# Patient Record
Sex: Female | Born: 1935 | Race: White | Hispanic: No | Smoking: Never smoker
Health system: Southern US, Community
[De-identification: ages and names within clinical notes are randomized; demographics above are authoritative.]

## PROBLEM LIST (undated history)

## (undated) DIAGNOSIS — Z7901 Long term (current) use of anticoagulants: Secondary | ICD-10-CM

## (undated) DIAGNOSIS — I10 Essential (primary) hypertension: Secondary | ICD-10-CM

## (undated) DIAGNOSIS — M1990 Unspecified osteoarthritis, unspecified site: Secondary | ICD-10-CM

## (undated) DIAGNOSIS — R0602 Shortness of breath: Secondary | ICD-10-CM

## (undated) DIAGNOSIS — E785 Hyperlipidemia, unspecified: Secondary | ICD-10-CM

## (undated) DIAGNOSIS — I252 Old myocardial infarction: Secondary | ICD-10-CM

## (undated) DIAGNOSIS — I2510 Atherosclerotic heart disease of native coronary artery without angina pectoris: Secondary | ICD-10-CM

## (undated) DIAGNOSIS — I4891 Unspecified atrial fibrillation: Secondary | ICD-10-CM

## (undated) HISTORY — DX: Atherosclerotic heart disease of native coronary artery without angina pectoris: I25.10

## (undated) HISTORY — PX: CATARACT EXTRACTION: SUR2

## (undated) HISTORY — PX: CORONARY ANGIOPLASTY WITH STENT PLACEMENT: SHX49

## (undated) HISTORY — DX: Long term (current) use of anticoagulants: Z79.01

## (undated) HISTORY — PX: APPENDECTOMY: SHX54

## (undated) HISTORY — DX: Unspecified atrial fibrillation: I48.91

## (undated) HISTORY — PX: TOTAL HIP ARTHROPLASTY: SHX124

## (undated) HISTORY — DX: Hyperlipidemia, unspecified: E78.5

## (undated) HISTORY — DX: Essential (primary) hypertension: I10

---

## 1974-04-08 HISTORY — PX: ABDOMINAL HYSTERECTOMY: SHX81

## 2004-08-14 ENCOUNTER — Inpatient Hospital Stay (HOSPITAL_COMMUNITY): Admission: RE | Admit: 2004-08-14 | Discharge: 2004-08-20 | Payer: Self-pay | Admitting: Orthopaedic Surgery

## 2004-08-14 ENCOUNTER — Ambulatory Visit: Payer: Self-pay | Admitting: Physical Medicine & Rehabilitation

## 2004-08-20 ENCOUNTER — Inpatient Hospital Stay
Admission: RE | Admit: 2004-08-20 | Discharge: 2004-08-25 | Payer: Self-pay | Admitting: Physical Medicine & Rehabilitation

## 2006-04-08 DIAGNOSIS — I252 Old myocardial infarction: Secondary | ICD-10-CM

## 2006-04-08 HISTORY — DX: Old myocardial infarction: I25.2

## 2006-05-15 ENCOUNTER — Inpatient Hospital Stay (HOSPITAL_COMMUNITY): Admission: AD | Admit: 2006-05-15 | Discharge: 2006-05-20 | Payer: Self-pay | Admitting: Cardiovascular Disease

## 2006-05-15 ENCOUNTER — Encounter: Payer: Self-pay | Admitting: Cardiovascular Disease

## 2006-05-18 ENCOUNTER — Ambulatory Visit: Payer: Self-pay | Admitting: Vascular Surgery

## 2007-01-27 ENCOUNTER — Encounter: Payer: Self-pay | Admitting: Cardiovascular Disease

## 2010-01-16 ENCOUNTER — Ambulatory Visit: Payer: Self-pay | Admitting: Cardiovascular Disease

## 2010-08-10 ENCOUNTER — Encounter: Payer: Self-pay | Admitting: Cardiovascular Disease

## 2010-08-10 DIAGNOSIS — I2510 Atherosclerotic heart disease of native coronary artery without angina pectoris: Secondary | ICD-10-CM | POA: Insufficient documentation

## 2010-08-10 DIAGNOSIS — M109 Gout, unspecified: Secondary | ICD-10-CM | POA: Insufficient documentation

## 2010-08-10 DIAGNOSIS — Z7901 Long term (current) use of anticoagulants: Secondary | ICD-10-CM | POA: Insufficient documentation

## 2010-08-10 DIAGNOSIS — I4891 Unspecified atrial fibrillation: Secondary | ICD-10-CM | POA: Insufficient documentation

## 2010-08-16 ENCOUNTER — Ambulatory Visit: Payer: Self-pay | Admitting: Cardiovascular Disease

## 2010-08-22 ENCOUNTER — Ambulatory Visit (INDEPENDENT_AMBULATORY_CARE_PROVIDER_SITE_OTHER): Payer: Medicare Other | Admitting: Cardiovascular Disease

## 2010-08-22 ENCOUNTER — Encounter: Payer: Self-pay | Admitting: Cardiovascular Disease

## 2010-08-22 DIAGNOSIS — I4891 Unspecified atrial fibrillation: Secondary | ICD-10-CM

## 2010-08-22 DIAGNOSIS — I2510 Atherosclerotic heart disease of native coronary artery without angina pectoris: Secondary | ICD-10-CM

## 2010-08-22 NOTE — Assessment & Plan Note (Signed)
Stable. Continue Coumadin.  Managed by her medical doctor

## 2010-08-22 NOTE — Assessment & Plan Note (Signed)
Pt has done well with her CAD.  No angina.  She complains about the cost of Plavix.  She has been on it for 4 years after stenting.  OK to stop plavix at this point.  Continue asa and coumadin.

## 2010-08-22 NOTE — Progress Notes (Signed)
Kathryn Schroeder Date of Birth  12-Jun-1930 [facility] [address]     [address] [place], [place]  [address] [phone]  Fax  [phone]  History of Present Illness:  Pt has a history of A-Fib and CAD - s/p stent.  She has done well.  Working out in her garden without any problems.  Current Outpatient Prescriptions on File Prior to Visit  Medication Sig Dispense Refill  . aspirin 81 MG tablet Take 81 mg by mouth daily.        . carvedilol (COREG) 12.5 MG tablet Take 12.5 mg by mouth 2 (two) times daily with a meal.        . Cholecalciferol (VITAMIN D3) 400 UNITS CAPS Take by mouth 2 (two) times daily.        . clopidogrel (PLAVIX) 75 MG tablet Take 75 mg by mouth every other day.        . losartan (COZAAR) 100 MG tablet Take 25 mg by mouth daily.       . magnesium oxide (MAG-OX) 400 MG tablet Take 400 mg by mouth 2 (two) times daily.        . Omega-3 Fatty Acids (FISH OIL) 1000 MG CAPS Take by mouth daily.        . Red Yeast Rice Extract 600 MG TABS Take by mouth 2 (two) times daily.        Marland Kitchen warfarin (COUMADIN) 1 MG tablet Take 5 mg by mouth as directed.       coumadin is mangaged by Dr. Arlyce Dice.  Allergies  Allergen Reactions  . Penicillins     Past Medical History  Diagnosis Date  . Coronary artery disease     STATUS POST OLD INFERIOR WALL M.I.  . Diabetes mellitus   . Gout   . Chronic anticoagulation   . Atrial fibrillation     Past Surgical History  Procedure Date  . Appendectomy   . Total hip arthroplasty     RIGHT HIP    History  Smoking status  . Never Smoker   Smokeless tobacco  . Not on file    History  Alcohol Use No    Family History  Problem Relation Age of Onset  . Cancer Father 31    Reviw of Systems:  Reviewed in the HPI.  All other systems are negative.  Physical Exam: BP 138/82  Pulse 66  Ht 5\' 8"  (1.727 m)  Wt 219 lb 6.4 oz (99.519 kg)  BMI 33.36 kg/m2 The patient is alert and  oriented x 3.  The mood and affect are normal.  The skin is warm and dry.  Color is normal.  The HEENT exam reveals that the sclera are nonicteric.  The mucous membranes are moist.  The carotids are 2+ without bruits.  There is no thyromegaly.  There is no JVD.  The lungs are clear.  The chest wall is non tender.  The heart exam reveals an irregular rate with a normal S1 and S2.  There are no murmurs, gallops, or rubs.  The PMI is not displaced.   Abdominal exam reveals good bowel sounds.  There is no guarding or rebound.  There is no hepatosplenomegaly or tenderness.  There are no masses.  Exam of the legs reveal no clubbing, cyanosis, or edema.  The legs are without rashes.  The distal pulses are intact.  Cranial nerves II - XII are intact.  Motor and sensory functions are  intact.  The gait is normal.  Assessment / Plan:

## 2010-08-24 NOTE — Discharge Summary (Signed)
Kathryn Schroeder, Kathryn [name]   MEDICAL RECORD NO.:  [id number]          PATIENT TYPE:  ORB   LOCATION:  4530                         FACILITY:  [facility]   PHYSICIAN:  Erick Colace, M.D.DATE OF BIRTH:  1930/08/07   DATE OF ADMISSION:  08/20/2004  DATE OF DISCHARGE:  08/25/2004                                 DISCHARGE SUMMARY   DISCHARGE DIAGNOSES:  1.  Right total hip arthroplasty Aug 14, 2004 secondary to osteoarthritis and      pain control.  2.  Coumadin for deep venous thrombosis prophylaxis.  3.  Anemia.  4.  Noninsulin-dependent diabetes mellitus.  5.  Hypertension.  6.  Glaucoma.  7.  E. coli urinary tract infection.  8.  Hypokalemia, resolved.   HISTORY OF PRESENT ILLNESS:  This 77 year old white female admitted Aug 14, 2004 with right hip pain x1 year.  X-rays with advanced osteoarthritis.  No  relief with conservative care.  Underwent a right total hip arthroplasty Aug 14, 2004 per Dr. Cleophas Dunker.  Placed on Coumadin for deep venous thrombosis  prophylaxis and partial weightbearing.  Hypokalemia 2.9 improved to 3.5  after supplement.  Still with some hip drainage and placed on erythromycin  x5 days for wound coverage.  No chest pain, no shortness of breath.  Admitted for comprehensive rehab program.   PAST MEDICAL HISTORY:  See discharge diagnoses.  No alcohol and no tobacco.   SOCIAL HISTORY:  Lives with husband in one level home.  Two steps to entry.  Husband with history of cerebrovascular accident and limited memory.  Daughter can assist as needed as well as a local son.   ALLERGIES:  PENICILLIN AND DIPHENHYDRAMINE.   CURRENT MEDICATIONS:  1.  Diovan with hydrochlorothiazide 160-12.5 mg daily.  2.  Norvasc 5 mg daily.  3.  Atenolol 50 mg daily.  4.  Amaryl 1 mg daily.  5.  Eye drops.  6.  Aspirin 81 mg daily.   HOSPITAL COURSE:  The patient did well while on rehabilitation services with  therapies initiated daily.  The  following issues were followed during the  patient's rehab course.  Pertaining to Ms. Moxley's right total hip  arthroplasty Aug 14, 2004, surgical site healing nicely.  Drainage had  greatly improved.  She had completed course of erythromycin for wound  coverage.  Neurovascularly sensation intact.  She was ambulating minimal  assist with a walker.  Partial weightbearing with hip precautions.  Pain  control ongoing with the use of oxycodone and good results.  She remained on  Coumadin for deep venous thrombosis prophylaxis with latest INR of 2.2.  She  would complete Coumadin protocol and then resume her aspirin therapy after  Coumadin completed.  Postoperative anemia 10.6, no bleeding episodes.  She  remained on iron supplement.  Blood sugars had some increase variables of  140, 218, 254.  Her Amaryl had recently been increased to 2 mg daily.  She  would followup with her primary M.D.  Blood pressure is controlled with the  use of her Diovan hydrochlorothiazide, Norvasc and Tenormin. She had no  headache or dizziness.  She was treated for an E. coli urinary tract  infection with Cipro during her rehab course. It was noted she was allergic  to penicillin thus choice of antibiotics were Cipro and monitoring of INR  while on Coumadin therapy.  Overall for her functional mobility she was  ambulating 90 feet minimal assistance with a rolling walker, minimal assist  transfers, minimal assist bed mobility, independent upper body bathing and  dressing, minimal assist lower body, home health therapies had been  arranged.  Latest labs showed stool for clostridium difficile to be  negative.  E. coli urinary tract infection Aug 21, 2004 with Cipro initiated  Aug 24, 2004.  Latest INR 2.2, hemoglobin 10.6, hematocrit 31, platelets  311,000.  Sodium 138, potassium 3.3, BUN 32, creatinine 1.1.  Chest x-ray no  active disease.  She was discharged to home.   DISCHARGE MEDICATIONS:  1.  At the time of  dictation included Coumadin with latest dose of 4 mg to      be completed on September 14, 2004,  2.  Diovan with hydrochlorothiazide 160-12.5 mg daily.  3.  Norvasc 5 mg daily.  4.  Tenormin 50 mg daily.  5.  Cosopt eye drops twice daily.  6.  Trinsicon  twice daily.  7.  Amaryl 2 mg daily.  8.  Oxycodone as needed for pain.  9.  Cipro 250 mg every 12 hours x7 days.   DISCHARGE INSTRUCTIONS:  She would followup with Dr. Oscar La her primary M.D.  at [phone].  Dr. Cleophas Dunker at [phone].  Home health therapy is  arranged.  Orders for home health nurse to check INR on Monday, Aug 27, 2004.  She was advised to resume her aspirin therapy after Coumadin  completed.      DA/MEDQ  D:  08/24/2004  T:  08/24/2004  Job:  [id number]   cc:   Claude Manges. Cleophas Dunker, M.D.  [address] [place]  [place] [address]  Fax: [phone]

## 2010-08-24 NOTE — H&P (Signed)
NAMELILLIANA, Kathryn Schroeder          ACCOUNT NO.:  [id number]   MEDICAL RECORD NO.:  [id number]          PATIENT TYPE:  INP   LOCATION:  NA                           FACILITY:  [facility]   PHYSICIAN:  Claude Manges. Whitfield, M.D.DATE OF BIRTH:  10-16-1930   DATE OF ADMISSION:  08/14/2004  DATE OF DISCHARGE:                                HISTORY & PHYSICAL   CHIEF COMPLAINT:  Right hip pain.   HISTORY OF PRESENT ILLNESS:  The patient is a 77 year old white female with  a history of severe right hip pain over this last one year.  The pain is  worsened with any type of weightbearing activity range of motion.  It is  located over the anterior thigh into the groin and around to the buttocks.  She is unable to hold her full weight, and she has fallen multiple times  over this last year.  She is currently walking with a walker due to the  discomfort.  She describes it as a deep aching sensation.  It does occur at  night.  She does have mechanical symptoms of popping and grinding.  She  denies any previous injury.   ALLERGIES:  PENICILLIN.   CURRENT MEDICATIONS:  1.  Diovan/HCTZ 160/12.5 mg p.o. daily.  2.  Norvasc 5 mg p.o. daily.  3.  Atenolol 50 mg p.o. daily.  4.  Glimepiride 1 mg p.o. daily.  5.  Cosopt one drop OU b.i.d.  6.  Aspirin 81 mg p.o. daily.  7.  Ibuprofen 200 mg p.o. b.i.d.  8.  MSM with glucosamine, two tab b.i.d.  9.  Fish oil, two tab daily.  10. Garlic pills, two tab p.o. daily.   PAST MEDICAL HISTORY:  1.  Diabetes.  2.  Hypertension.  3.  Hypercholesterolemia.  4.  Glaucoma.  5.  Obesity.   PAST SURGICAL HISTORY:  1.  Hysterectomy in 1976.  2.  Appendectomy in 1948.  3.  Cataract surgery in 2003, both eyes.  The patient denies any      complications with the above-mentioned surgical procedures.   SOCIAL HISTORY:  This is a 77 year old female, centrally obese.  Denies any  history of smoking or alcohol use.  She is married.  She lives with her  frail husband.   Has three grown children.  Lives in a [place] house.  She  is retired.   FAMILY PHYSICIAN:  Dr. Romeo Rabon in [place], [place] at [phone]  [phone].   FAMILY HISTORY:  Mother deceased at age 29, from complications of thyroid  surgery.  Father deceased at age 53, from lung cancer.  One sister alive,  unknown medical history due to never going to the doctor.   REVIEW OF SYSTEMS:  Positive for upper and lower dentures.  She does wear  glasses.  She does have issues with increased urinary frequency.  Otherwise  all the categories are negative and noncontributory at this time.   PHYSICAL EXAMINATION:  VITAL SIGNS:  Height 5 feet 8 inches, weight 240  pounds.  Blood pressure 162/82, pulse 72 and regular, respirations 12,  afebrile.  GENERAL:  This is a  slightly obese white female, who ambulates with the use  of a cane in a  very slow deliberate gait.  She is able to get herself onto  the exam table with just assistance with balance.  HEENT:  Head normocephalic.  Pupils equal, round, reactive.  Extraocular  movements intact.  External ears without deformity.  Gross hearing intact.  NECK:  Supple, no palpable lymphadenopathy.  Thyroid region was nontender.  She had good range of motion of her cervical spine without any difficulty.  CHEST:  Lung sounds were clear and equal bilaterally.  No wheezes, rales, or  rhonchi.  HEART:  A regular rate and rhythm.  No murmurs, rubs, or gallops.  ABDOMEN:  Round, obese, normoactive bowel sounds.  No CVA region tenderness.  EXTREMITIES:  Upper extremities were symmetric in size and shape.  She was  only able to elevate her arms to about 90 degrees, due to discomfort and  limited range of motion, due to arthritis in her shoulders.  She had a full  range of motion of her elbows and wrists, and motor strength is 5/5.   Lower extremities:  Right hip had full extension.  She was barely able to  bring it up to 90 degrees.  She had minimal range of motion  with internal  and external rotation of maybe 5 degrees, limited by discomfort.  The left  hip had full extension.  Flexion was up to about 100 degrees.  She had about  15 degrees internal and external rotation of the hip with limited  discomfort.  Bilateral knees were symmetrical.  No signs of erythema or  ecchymosis.  No effusion.  She had full extension.  Flexes her back to 110-  115 degrees easily.  No instability.  The calves are soft and nontender.  She had symmetrical ankles with good dorsi and plantar flexion.  PERIPHERAL VASCULAR:  Carotid pulses 2+.  No bruits.  Radial pulses 2+.  I  was unable to palpate any dorsalis or posterior tibial pulses, but she had  intact skin that was warm and dry with brisk refill, with 2+ edema in the  ankles.  No venous stasis changes or ulcers.  NEUROLOGIC:  The patient was conscious, alert and appropriate.  Held an easy  conversation with the examiner.  Cranial nerves II-XII  were grossly intact.  She had normal light touch sensation throughout.  She had no gross  neurological defects noted.   IMPRESSION:  1.  End-stage osteoarthritis, right hip.  2.  Hypertension.  3.  Diabetes.  4.  Hypercholesterolemia.  5.  Glaucoma.  6.  Obesity.   PLAN:  The patient is to undergo all routine labs and tests, prior to having  a right total hip arthroplasty by Dr. Claude Manges. Whitfield at [facility] on Aug 14, 2004.  Dr. Cleophas Dunker had written a letter to the  patient's primary care physician, but we have not received any information  back from this patient's physician, and the patient  will contact his office today, to get that information transferred for  further evaluation.  The patient indicates that she does see her primary  care physician every three months, but she will talk with him to see if he  needs to see her prior to this surgery.     RWK/MEDQ  D:  08/07/2004  T:  08/07/2004  Job:  [id number]

## 2010-08-24 NOTE — Cardiovascular Report (Signed)
NAMEMADGELINE, RAYO NO.:  [id number]   MEDICAL RECORD NO.:  [id number]          PATIENT TYPE:  INP   LOCATION:  6524                         FACILITY:  [facility]   PHYSICIAN:  Vesta Mixer, M.D. DATE OF BIRTH:  08-16-1930   DATE OF PROCEDURE:  05/16/2006  DATE OF DISCHARGE:  05/15/2006                            CARDIAC CATHETERIZATION   Ms. Paone is a 77 year old female with a history of atrial  fibrillation.  She was seen at [facility] for several days of  intermittent chest pain.  She was found to have a positive cardiac  enzymes.  She was transferred to [facility] but did actually  arrive until noon on Thursday.  She was admitted by Dr. Patty Sermons and we  saw her today.   She has T-wave inversions in the inferior leads and has elevated  troponin level.  In addition, she had an echocardiogram that was  suspicious for mitral stenosis.  Given this, we performed a right and  left heart catheterization.   The right femoral artery and right femoral vein were cannulated using a  Smart needle.  It was very difficult to feel the pulses.   HEMODYNAMICS:  The right atrial pressure was 14 with a right ventricular  pressure of 45/50.  The pulmonary capillary wedge pressure is 26,  pulmonary artery pressure is 42/60.  Cardiac output by thermodilution is  3.4 with a cardiac index of 1.5.  Fick cardiac output was 4.2 with an  index of 1.9.  Left ventricular pressure was 160/20 with an aortic  pressure of 168/81.  On simultaneous wedge LV pressures there was no  significant gradient between the wedge pressure and the end-diastolic  pressure.   LEFT VENTRICULOGRAM:  Revealed moderate to severe inferior  basilar/basilar hypokinesis.  There is perhaps some areas of akinesis.  The overall ejection fraction was mildly depressed.   Angiography of the left main reveals mild irregularities.   The left anterior descending artery has moderate disease  proximally.  There is an eccentric 50-60% stenosis.  The first diagonal artery has  minor luminal irregularities.  The remainder of the LAD is fairly small  and has no other irregularities.   The left circumflex artery is a moderate to large vessel.  There is a  20% proximal stenosis.   The right coronary artery is a moderate vessel and os dominant.  There  is a mid 99% subtotal occlusion.  There is TIMI grade 2 flow through  this lesion.   PERCUTANEOUS TRANSLUMINAL CORONARY ANGIOPLASTY:  A 6-French Judkins  right 4 guide was placed in the right coronary artery.  An Integrilin  double bolus drip was started.  The patient received 5,700 units of  heparin with an resultant ACT of 306.  She received 300mg  of Plavix.   A BMW wire was positioned OM the distal right coronary artery with some  effort.  Initially, we tried a 3.0 Quantum monorail but it would not  cross the lesion.  A 2.0 x 15-mm Maverick was used and eventually did  cross the mid RCA lesion.  It was inflated up to 10 atmospheres  for 24  seconds.   At this point, the 3.0 x 15 Quantum did pass.  It was inflated up 8  atmospheres for 27 seconds and then a more proximal inflation of 6  atmospheres for 12 seconds.  This resulted in an improved lumen but  later there was some plaque rupture and abrupt occlusion of the vessel.  We passed the balloon back down and with the flow was resolved once we  crossed the plaque.   We did one additional inflation with the 2.0 x 15-mm Maverick up to 8  atmospheres.  At this point, a 2.75 x 16-mm stent was placed down the  vessel.  It would not cross at this tortuous area.  At this point, a  2.75 x 12-mm Taxus stent was positioned across the lesion.  It took  great effort.  It was deployed at 14 atmospheres for 23 seconds.   Post-stent dilatation was achieved using a 3.0 x 12-mm Quantum monorail.  We inflated it up to 14 atmospheres for 15 seconds.  It was then placed  distally and was  inflated up to 12 atmospheres for 13 seconds and then  pulled proximally and inflated up to 14 atmospheres for 12 seconds.  This resulted in a very nice angiographic result.  There was no evidence  of edge dissection.  The patient is hemodynamically stable.   COMPLICATIONS:  1. Mildly elevated right heart pressures mostly due to her elevated      end diastolic pressure.  2. Mildly depressed left ventricular systolic function.  3. Diffuse three-vessel coronary artery disease.      a.     She has a tight stenosis in the right coronary artery and is       now status post percutaneous transluminal coronary angioplasty and       stenting of her right coronary artery.      b.     She has moderate 50-60% eccentric disease in her left       anterior descending artery.  This will need to be treated very       aggressively with lipid lowering agents.      c.     There is no evidence of mitral stenosis.           ______________________________  Vesta Mixer, M.D.     PJN/MEDQ  D:  05/16/2006  T:  05/16/2006  Job:  [id number]

## 2010-08-24 NOTE — Op Note (Signed)
Kathryn Schroeder, SABATINO NO.:  [id number]   MEDICAL RECORD NO.:  [id number]          PATIENT TYPE:  INP   LOCATION:  2899                         FACILITY:  [facility]   PHYSICIAN:  Claude Manges. Whitfield, M.D.DATE OF BIRTH:  [birth year]   DATE OF PROCEDURE:  08/14/2004  DATE OF DISCHARGE:                                 OPERATIVE REPORT   PREOPERATIVE DIAGNOSIS:  End-stage osteoarthritis right hip.   POSTOPERATIVE DIAGNOSIS:  End-stage osteoarthritis right hip.   PROCEDURE:  Right total hip replacement.   SURGEON:  Claude Manges. Cleophas Dunker, M.D.   ASSISTANTS:  Lenard Galloway. Chaney Malling, M.D.  Legrand Pitts Duffy, P.A.-C.   ANESTHESIA:  General orotracheal.   COMPLICATIONS:  None.   COMPONENTS:  DePuy AML 12.0 mm small stature Prodigy femoral component, 32  mm hip ball with a +5 mm neck length, 52 mm outer diameter Pinnacle 100  series acetabular cup, apex hole eliminator, and a Pinnacle Marathon lipped  10-degree acetabular liner, 52 mm outer diameter.   PROCEDURE:  With the patient comfortable on the operating room table and  under general orotracheal anesthesia, nursing staff inserted a Foley  catheter.  The patient had a very large abdominal panniculus and was  difficult to position on the table.  The patient was secured to the  operating table with the Innomed hip system, with the patient in the lateral  decubitus position.   The right hip was then prepped with Betadine scrub and the DuraPrep from  iliac crest to the midcalf.  Sterile draping was performed.   A routine southern incision was utilized and by sharp dissection was carried  down to the subcutaneous tissue.  By Bovie dissection, abundant adipose  tissue was incised.  The iliotibial band was identified and incised along  the length of the skin incision.  Self-retaining retractors were inserted.  With the hip internally rotated, the short external rotators were identified  and carefully incised from their attachment  to the posterior aspect of the  greater trochanter.  [facility] retractor was inserted.  The capsule was  identified, incised along the femoral neck and head.  There was a bloody  effusion.  The hip was then dislocated posteriorly without any difficulty.  The head was misshapen and devoid of articular cartilage, with multiple  subchondral cysts.  Using the AML calcar guide, an osteotomy was made just  below the femoral head on the femoral neck, and the femoral head was then  delivered from the wound.  Retractors were then inserted.  A starter hole  was then made in the pyriformis fossa.  The calcar finder was inserted.  Reaming was performed to 11.5 mm to accept a 12 mm prosthesis.  Rasping was  then performed initially with a 10.5 and then a 12 small, and then with a  10.5 and a 12 large, and the calcar reamer was applied to obtain the  appropriate calcar angle.   Retractor was then placed about the acetabulum.  The labrum was sharply  excised.  Reaming was performed to 51 mm to accept a 52 mm prosthesis.  We  had very nice bleeding  circumferentially.   We trialed the 50 with an excellent fit and then inserted the 52 mm outer  diameter 100 series acetabular component.  The trial polyethylene liner was  then inserted.  The femoral rasp was then inserted, followed by the +1 hip  ball.  With the reduction, we had posterior dislocation.  So we repositioned  the acetabulum in slightly more flexion and abduction, reinserted the  prosthesis.  Then, it was perfectly stable, but we felt we were too tight.  Accordingly, the femoral trial components were removed, and I resected  approximately 4 mm of femoral neck.  I then reinserted the reamers and the  rasps at this point using the 10.5 small and the 12 mm small, with an  excellent fit.  Leaving this in place, we then trialed several neck lengths  and finally decided on the Prodigy with the +5 hip ball as the most stable  construct.  I maintained  leg lengths.  We had perfect stability in flexion  and extension, internal and external rotation, abduction, and adduction.   The trial components were then removed.  The joint was copiously irrigated  with saline solution.  The apex __________ eliminator was inserted, followed  by the polyethylene liner.  The 12 mm small stature Prodigy component was  then impacted flush on the calcar.  We trialed the +5 neck length with the  32 mm hip ball, with excellent position.  Accordingly, the [facility] taper neck  was cleaned, and the final +5 mm neck length, 32 mm hip ball was then  impacted.  The joint was inspected.  It was clear of any loose material.  It  was again irrigated.  The entire construct was reduced, with excellent  stability and maintenance of leg lengths.   The wound was again irrigated with saline solution and the capsule closed  anatomically with 0 Ethibond.  Short external rotators were closed with a  similar material.  Iliotibial band was closed with a running 0 Vicryl, the  subcutaneous in two layers with 0 and 2-0 Vicryl, the skin closed with skin  clips.  Sterile bulky dressing was applied, followed by the Ioban, as the  patient was allergic to adhesive tape.  The patient was then placed on the  operating room stretcher and returned to the postanesthesia recovery room in  satisfactory condition.      PWW/MEDQ  D:  08/14/2004  T:  08/14/2004  Job:  [id number]

## 2010-08-24 NOTE — Discharge Summary (Signed)
Kathryn Schroeder, Kathryn Schroeder NO.:  [id number]   MEDICAL RECORD NO.:  [id number]          PATIENT TYPE:  INP   LOCATION:  5002                         FACILITY:  [facility]   PHYSICIAN:  Claude Manges. Whitfield, M.D.DATE OF BIRTH:  24-May-1930   DATE OF ADMISSION:  08/14/2004  DATE OF DISCHARGE:  08/20/2004                                 DISCHARGE SUMMARY   ADMISSION DIAGNOSES:  1.  End-stage osteoarthritis, right hip.  2.  Hypertension.  3.  Diabetes.  4.  Hypercholesterolemia.  5.  Glaucoma.  6.  Obesity.   DISCHARGE DIAGNOSES:  1.  Status post right total hip arthroplasty.  2.  Poor urinary output.  3.  Acute blood loss anemia secondary to surgery.  4.  Shortness of breath/atelectasis.  5.  Hypokalemia.  6.  Ileus without obstruction.  7.  Slow progress with physical therapy.  8.  Diabetes mellitus.  9.  Hypertension.  10. Hypercholesterolemia.  11. Obesity.  12. Glaucoma.   HISTORY OF PRESENT ILLNESS:  Kathryn Schroeder is a 77 year old white female  with a history of severe right hip pain for the last year.  Pain becomes  worse with any type of weightbearing activity or range of motion.  The pain  is located over the anterior thigh into the groin and around into the  buttocks.  The patient is unable to hold her full weight on the right leg  and has fallen multiple times over the past year.  She uses a walker to aid  her ambulation.  She does have waking pain, mechanical symptoms positive for  popping and grinding.  She denies any previous injury.   ALLERGIES:  PENICILLIN.   MEDICATIONS:  1.  Diovan/HCTZ 160/12.5 mg one p.o. daily  2.  Norvasc 5 mg one p.o. daily.  3.  Atenolol 50 mg one p.o. daily.  4.  Glyburide 1 mg p.o. daily.  5.  Cosopt one drop OU b.i.d.  6.  Aspirin 81 mg one p.o. daily.  7.  Ibuprofen 200 mg one p.o. b.i.d.  8.  MSM with glucosamine two tablets b.i.d.  9.  Fish oil two tablets daily.  10. Garlic pills two tablets p.o. daily.   SURGICAL PROCEDURE:  The patient was taken to the operating room on Aug 14, 2004, by Claude Manges. Cleophas Dunker, M.D., assisted by Lenard Galloway. Chaney Malling, M.D.,  and Legrand Pitts. Duffy, P.A.-C.  The patient was placed under general anesthesia  and the right total hip arthroplasty was performed.  The following  components were used:  A DePuy AML 12.0 mm small-stature Prodigy femoral  component, 32 mm hip ball with a 5 mm neck length, 52 outer diameter  Pinnacle series acetabular cup, apex hole eliminator, a Pinnacle Marathon  lip 10 degree acetabular liner with 52 mm outer diameter.  The patient  tolerated the procedure well and returned to recovery in good, stable  condition.   CONSULTATIONS:  The following consults were obtained while the patient was  hospitalized:  PT, OT, case management, pharmacy, and medicine [facility]).   HOSPITAL COURSE:  Postop day 1, patient afebrile, vital signs stable.  The  patient alert and oriented.  Mild nausea overnight.  The patient denies  shortness of breath or chest pain.  H&H 10.7 and 30.6, white count was 9300.  Patient with a poor urinary output.  IV was increased to 110 mL/hr. of  normal saline.  The patient did have dizziness with sitting up, probably  some orthostatic hypotension.  Due to the patient's poor urinary output,  nausea and vomiting, [facility] was consulted.  They recommended that  the IV rate be increased and the patient receive a bolus of IV fluids.  The  patient was placed on strict I&O's with daily weights.  UA and C&S sent.  Revisit of BMET made.   Postop day 2, patient afebrile, vital signs stable.  Urinary output remained  low.  The patient reported heartburn with orange juice at meal.  Mild  hypokalemia.  Potassium was replaced.  The patient's white count is 12,500,  H&H 9.9 and 27.9.  BUN was 33 and creatinine 1.3.  In 1920, out 470.  Abdomen with positive distention, no bowel movement, abdomen films obtained.   Postop  day 3, the patient denies chest pain, shortness of breath and calf  pain.  Pain well-controlled.  BM x3.  The patient feels much improved.  Patient afebrile, vital signs stable.  H&H 9.5, 27.0, white count was  12,600.  BUN was 22, creatinine 1.  I&O:  1800/675.  KUB showed large  intestinal distention consistent with ileus, nonobstructive.  Potassium  dropped to 2.9.  Increased K-Dur replacement.  Later that day the patient  developed shortness of breath and chest x-ray was ordered.  Aggressive  incentive spirometry was ordered.   Postop day 4, the patient still has shortness of breath.  Vital signs are  stable, patient afebrile.  Chest x-ray showed atelectasis.  Continue  incentive spirometry.  Hypokalemia resolving, potassium 3.1.  Poor urinary  output resolved.   Postop day 5, the patient still complaining of shortness of breath when  lying flat despite O2 saturation of 95% on room air.  Patient with poor  appetite and nausea.  Had no vomiting.  Improved progress with physical  therapy and walked to nurses' station the day prior.  Patient afebrile with  vital signs stable.  Potassium is 3.5.  Right hip with mild to moderate  serosanguineous drainage.  K-Pad applied.  Coumadin held on Aug 19, 2004.  At the time the patient's PT was 25.9, INR 3.5.   Postop day 6, patient feeling much improved.  Tolerating diet better.  Right  hip drainage still mild to moderate.  The patient was started empirically on  erythromycin 500 mg b.i.d. x5 days.  Coumadin held, PT of 26.9, INR of 3.7.  Incentive spirometry was encouraged.  Patient to be discharged to rehab [facility]  when a bed available.   LABORATORY DATA:  Routine labs on admission:  CBC on admission, all values  within normal limits.  Coagulation studies, all values within normal limits.  Routine chemistries on admission:  Sodium 137, potassium 4.5, chloride 105,  bicarb 25, glucose 130, BUN 24, creatinine 1.0.  Hepatic enzymes on admission,  all values within normal limits.  Urinalysis on admission was  negative.  Urinalysis on Aug 16, 2004, showed moderate hemoglobin, red blood  cells were 7-10 per high-power field, uric acid crystals noted.  Urine  culture showed no growth.  Next, ECG dated Aug 07, 2004, showed sinus  bradycardia with first degree AV block.  Heart rate  is 51 beats per minute,  PR interval 232 msec, PRT axis 72, 37, 42.   X-RAYS:  Portable pelvis dated Aug 14, 2004, showed a right total hip  replacement without any complicating features.  Right hip lateral projection  dated Aug 14, 2004, showed satisfactory appearance of right hip after right  total hip replacement.  One-view abdomen dated Aug 16, 2004, showed marked  intestinal distention consistent with an ileus, nonobstructive.  Cannot  exclude free air without a right decubitus film.  Chest x-ray, one-view  portable, Aug 17, 2004, showed bibasilar atelectasis.  No evidence of  pulmonary edema.  Lungs otherwise clear.  Two-view chest dated Aug 20, 2004,  showed elevated right hemidiaphragm, no active disease.   MEDICATIONS ON FLOOR:  1.  Cleocin 600 mg IV q.8h. x3 days.  2.  Colace 100 mg p.o. b.i.d.  3.  Senokot one p.o. b.i.d. a.c.  4.  Laxative of choice p.r.n. constipation.  5.  Coumadin per pharmacy protocol.  6.  Percocet 5/325 mg one to two tablets q.4h. p.r.n. pain.  7.  Tylenol 325 or 650 mg p.o. q.4h. p.r.n. temperature greater than 101.5.  8.  Robaxin [phone] mg p.o. or IV q.6-8h. p.r.n. spasm.  9.  Restoril 15 mg p.o. q.h.s. p.r.n. sleep.  10. Reglan 10 mg p.o./IV q.8h. p.r.n. nausea.  11. Diovan/HCT 160/12.5 mg p.o. q.a.m.  12. Norvasc 5 mg p.o. q.a.m.  13. Atenolol 50 mg one p.o. q.a.m.  14. Glyburide 1 mg p.o. q.a.m.  15. Cosopt one drop bilateral eyes b.i.d.  16. Multivitamin one p.o. q.a.m.  17. Dulcolax 10 mg per rectum x1.  18. K-Dur 40 mEq p.o. b.i.d.  19. Erythromycin 500 mg one p.o. b.i.d. x5 days, started on Aug 20, 2004.   20. The patient was on sliding scale insulin Humulin R/__________ regular      given 30 minutes before meals with no bedtime coverage, moderate insulin      sensitivity.   DISCHARGE INSTRUCTIONS:  1.  Medications:  Floor medications are to be adjusted by rehab physician.  2.  Activity:  The patient is 50% weightbearing on the right lower extremity      with walking.  3.  Diet:  Med-Carb modified.   CONDITION ON DISCHARGE:  The patient was discharged to rehab in good, stable  condition.   SPECIAL CARE:  Right knee immobilizer in the bed.   WOUND CARE:  Patient to keep wound clean and dry.  Change dressing daily.  May shower after two days of no drainage.  Dr. Hoy Register office is to be  notified if any signs of infection or temperature greater than 101.5.   FOLLOW-UP:  The patient will need to follow up with Dr. Cleophas Dunker  approximately 14 days after surgery.       GC/MEDQ  D:  11/06/2004  T:  11/07/2004  Job:  [id number]

## 2010-08-24 NOTE — H&P (Signed)
NAMESELDA, [name]   MEDICAL RECORD NO.:  [id number]          PATIENT TYPE:  ORB   LOCATION:  4530                         FACILITY:  [facility]   PHYSICIAN:  Ranelle Oyster, M.D.DATE OF BIRTH:  Aug 31, 1930   DATE OF ADMISSION:  08/20/2004  DATE OF DISCHARGE:                                HISTORY & PHYSICAL   CHIEF COMPLAINT:  Right hip pain, osteoarthritis.   HISTORY OF PRESENT ILLNESS:  This is a 77 year old female with right hip  pain and osteoarthritis failing conservative measures.  Ultimately, on Aug 14, 2004 the patient underwent a right total hip replacement by Dr.  Cleophas Dunker.  She was placed on Coumadin for postoperative DVT prophylaxis.  She is partial weightbearing on the right lower extremity.  The patient had  mild hypokalemia which was supplemented.  She was also placed on  erythromycin for persistent wound drainage.  Some crackles were noted in the  right base today, and a chest x-ray was ordered.  She is admitted to the  subacute rehab for further therapy to reach a modified  independent/supervision level.   REVIEW OF SYSTEMS:  Positive for cough, occasional reflux, low back pain,  right hip pain.  She is sleeping fairly well.  Bowel and bladder intact.  Full review of systems is in the written H&P.   PAST MEDICAL HISTORY:  Positive for hypertension, diabetes, hyperlipidemia,  glaucoma, appendectomy, hysterectomy, bilateral cataracts.   FAMILY HISTORY:  Noncontributory.   SOCIAL HISTORY:  The patient lives with her husband in one-level house, two  steps to enter.  Husband has had a stroke in the past and can provide  limited assistance.  Daughter can help.  The patient was independent prior  to arrival with walker.   MEDICATIONS:  1.  Diovan.  2.  HCTZ.  3.  Norvasc.  4.  Atenolol.  5.  Amaryl.  6.  Cosopt.  7.  Aspirin.   ALLERGIES:  1.  PENICILLIN.  2.  DIPHENHYDRAMINE.   Laboratory values reveal hemoglobin  9.5, white count 12.6, BUN and  creatinine 23 and 1.0, sodium 134, potassium 3.4,  INR 3.7.   PHYSICAL EXAMINATION:  VITAL SIGNS:  Blood pressure 123/81, pulse 73,  temperature 97, respiratory rate 18.  GENERAL:  The patient is pleasant in no acute distress.  HEENT:  Pupils equal, round, reactive to light and accommodation.  Extraocular eye movements are intact.  Oral mucosa is pink and moist.  NECK:  Supple without jugular venous distention or adenopathy.  LUNGS:  On respiratory exam, chest was slightly decreased with crackles in  the left base.  HEART:  Regular rate and rhythm without murmurs, rubs or gallops.  No  clubbing or cyanosis was seen.  The patient had trace edema in the right  lower extremity.  ABDOMEN:  Soft and nontender.  Wound was intact with mild serosanguineous  discharge.  NEUROLOGIC:  Cranial nerves II-XII grossly intact.  Cognitively, the patient  is appropriate with good mood and affect.  Motor exam is 5/5 both upper  extremities.  Lower extremities she is 2/5 proximally and 3+ to 4  distally.  Left lower extremity is 4+/5 throughout.  Sensory exam is grossly intact.  Reflexes are 2+.   ASSESSMENT/PLAN:  1.  Status post right total hip replacement for severe osteoarthritis.  The      patient is admitted to subacute rehab to achieve modified independent      goals.  Estimated length of stay is approximately 7-10 days.  Prognosis      is fair to good.  PT/OT, MV nursing, case management will be involved.  2.  Pain management with oxycodone IR p.r.n. as well as p.r.n. Flexeril for      spasm.  3.  Deep vein thrombosis prophylaxis with Coumadin.  Hold Coumadin today for      increased INR (3.7).  4.  Anemia.  Check CBC on admission.  5.  Diabetes.  Monitor CBG a.c. and h.s.  Continue Amaryl 1 mg daily.  6.  Pulmonary.  Followup chest x-ray results.  Encourage incentive      spirometry.  Observe patient's clinical status.  7.  Fluids, electrolytes, nutrition.   Potassium replaced.  Check BMET on      admission.      ZTS/MEDQ  D:  08/20/2004  T:  08/20/2004  Job:  [id number]

## 2010-08-24 NOTE — H&P (Signed)
Kathryn Schroeder, Kathryn Schroeder NO.:  [id number]   MEDICAL RECORD NO.:  [id number]          PATIENT TYPE:  INP   LOCATION:  3304                         FACILITY:  [facility]   PHYSICIAN:  Cassell Clement, M.D. DATE OF BIRTH:  1931/03/29   DATE OF ADMISSION:  05/15/2006  DATE OF DISCHARGE:                              HISTORY & PHYSICAL   CHIEF COMPLAINT:  Chest pain.   HISTORY:  This is a 77 year old, married, Caucasian female who was  admitted in transfer from [facility] because of chest pain.  She presented to [facility] in [facility] last evening and  because of lack of beds was not transferred to [facility] until  the middle of the day on May 15, 2006.  She gives a history of  having awakened with diarrhea early in the morning on May 12, 2006.  Later in the morning, she felt chest pressure and felt near syncope.  She noted pain in her jaws bilaterally and down both arms to the elbows.  She was having nausea and vomiting.  After she finished vomiting, she  felt a little better for the rest of that day.  On the following day  May 13, 2006, she again had brief chest pressure and then felt okay  and last evening at about 4 had more prolonged chest pressure radiating  to the jaws which resolved but then returned in the evening last night,  and she went to [facility], where her cardiac enzymes were  slightly elevated, and she was advised to be transferred to [facility]  She does not have any prior history of known coronary artery  disease.   She is followed for her medical problems by Dr. Romeo Rabon in  [place], [place].   1. She is a known diabetic and is on low dose of Amaryl 1 mg a day.  2. She is on Coreg 12.5 mg b.i.d. for hypertension and questionably      weak heart muscle.  3. And, she is also on Lisinopril 20 mg a day.  She is not on a      diuretic.  4. She is also on fish oil over-the-counter for  hypercholesterolemia.   FAMILY HISTORY:  Reveals that her mother died after goiter surgery at  age 45.  Father died at 105 of cancer.  The family history is negative  for premature coronary disease.   SOCIAL HISTORY:  Reveals that she lives with her husband.  Her husband  has had a stroke and has some dementia and she is the caregiver.  She  does not use tobacco or alcohol.  She is a housewife.  She has three  grown children.   PREVIOUS SURGERIES:  1. Appendectomy as a youth.  2. She had a hysterectomy in 1976.  3. She had a right hip replacement, 2006, by Dr. [name].   ALLERGIES:  PENICILLIN.   REVIEW OF SYSTEMS:  GASTROINTESTINAL:  No history of ulcers or  hematochezia or melena.  GENITOURINARY:  No dysuria but she does have  urinary frequency, which she relates to her diabetes.  CARDIOVASCULAR:  No history of heart failure.  ENDOCRINE:  She has been a diabetic for  years which was initially diet-controlled.  Remainder of review of  systems is negative in detail.   PHYSICAL EXAMINATION:  VITAL SIGNS:  Are as recorded, the blood pressure  is 137/67, pulse is 58 and irregularly irregular.  GENERAL APPEARANCE:  Reveals a slightly obese woman in no acute  distress.  She is pain free at the present time.  SKIN:  Warm and dry.  HEENT: Negative.  Jugular venous pressure normal.  Carotids no bruits.  CHEST:  Clear.  HEART:  No murmur, gallop or rub.  ABDOMEN:  Obese and nontender.  EXTREMITIES:  Show good peripheral pulses.  No edema.  No __________  .   EKG shows atrial fibrillation.  She has inverted T-waves in III and AVF  suggesting possible recent non-Q inferior wall MI.  CHEST X-RAY:  Showed  cardiomegaly but no CHF.  Cardiac enzymes were elevated with a troponin  I of 0.57, and 1.06.  CBC normal.  Renal function normal.  Potassium  5.4.  Blood sugar 163.  INR is 2.2 on chronic Coumadin.   IMPRESSION:  1. Stuttering chest pain over the past 4 days with slight  increase in      enzymes and an abnormal EKG, are all consistent with acute coronary      syndrome.  2. Diabetes mellitus.  3. Osteoarthritis.  4. History of hypertension.  5. History of hypercholesterolemia.  6. Chronic atrial fibrillation on Coumadin.   DISPOSITION:  1. The patient is being admitted to Dr. Kristeen Miss.  2. We will add a baby aspirin.  3. We are going to reverse her Coumadin with vitamin K today, and      begin Lovenox.  4. We will anticipate cardiac catheterization on Friday by Dr. Elease Hashimoto      of [facility].           ______________________________  Cassell Clement, M.D.     TB/MEDQ  D:  05/15/2006  T:  05/15/2006  Job:  [id number]   cc:   Romeo Rabon

## 2010-08-24 NOTE — Discharge Summary (Signed)
Kathryn Schroeder, Kathryn Schroeder NO.:  [id number]   MEDICAL RECORD NO.:  [id number]          PATIENT TYPE:  INP   LOCATION:  3708                         FACILITY:  [facility]   PHYSICIAN:  Vesta Mixer, M.D. DATE OF BIRTH:  05-29-1930   DATE OF ADMISSION:  05/15/2006  DATE OF DISCHARGE:  05/20/2006                               DISCHARGE SUMMARY   DISCHARGE DIAGNOSES:  1. Inferior wall myocardial infarction -- status post percutaneous      transluminal coronary angioplasty and stenting of her mid right      coronary artery.  2. Pseudoaneurysm formation following her heart catheterization --      treated with manual compression using ultrasound guidance  3. Chronic atrial fibrillation.  4. Hypertension.  5. Diabetes mellitus.  6. Hyperlipidemia -- intolerant to statins.   DISCHARGE MEDICATIONS:  1. Plavix 75 mg a day.  2. Enteric-coated aspirin 81 mg a day.  3. Nitroglycerin one under tongue as needed.  4. Coumadin.  She is to resume her previous dose and have pro times      checked at Dr. Aaron Edelman office.  5. Lisinopril 20 mg a day.  6. Coreg 12.5 mg p.o. b.i.d.  7. Amaryl 2 mg a day.  8. Cosopt eye drops as directed by Dr. Oscar La.  9. Allopurinol once a day.   DISPOSITION:  The patient is to see Dr. Elease Hashimoto in approximately 1 week.  She is to see Dr. Oscar La for a followup visit.  She will have her pro  time checked on February 20.   HISTORY:  Kathryn Schroeder is a 77 year old female who was admitted to my  service in transfer from a hospital in [place].  She was admitted with  changes consistent with an inferior wall myocardial infarction.  Please  see dictated H&P for further details.   HOSPITAL COURSE:  PROBLEM #1 - CORONARY ARTERY DISEASE:  The patient was  admitted with positive troponin levels.  Her EKG had inverted T waves in  the inferior leads suggestive of an inferior wall myocardial infarction.  She had a right and left heart catheterization the  following morning.  The right heart catheterization was performed because of some question  of mitral stenosis on echocardiogram and by the fact that she is in  atrial fibrillation.   She was found to have moderate coronary artery disease involving her LAD  and circumflex artery.  Her right coronary artery was subtotally  occluded with TIMI grade 2 flow.  She had successful PTCA and stenting  of her right coronary artery using a 2.7 x 12-mm Taxus stent.  It was  postdilated using a 3.0 x 12-mm Quantum Monorail.  We were able to  achieve a very nice result and she tolerated the procedure quite well.   Unfortunately, the following morning, she develop some hypotension and  was subsequently found to have a pseudoaneurysm.  Please see the  following.   The patient did not have any further episodes of chest discomfort.  She  is discharged home on Plavix and aspirin.  She will need to take Plavix  for at least several years.  The vessel was very calcified and I would  recommend Plavix lifelong if she can afford it.   PROBLEM #2 - PSEUDOANEURYSM:  The patient was still somewhat  anticoagulated at the time of her heart catheterization.  In addition,  we used Integrilin because of her subtotal occlusion and because of the  stent placement.  She had a pseudoaneurysm formation.  This was found on  ultrasound and on February 11, she had ultrasound-guided compression of  the pseudoaneurysm with successful thrombosis.  She did not have any  further problems.  She was a little bit sore, but otherwise felt quite  well.   PROBLEM #3 - ATRIAL FIBRILLATION:  The patient's Coumadin was reversed  with vitamin K.  She will be restarting on her Coumadin on February 13.  She will follow up with her medical doctor for her pro times.   PROBLEM #4 - DYSLIPIDEMIA:  The patient has tried numerous statin  medications and states that she is intolerant to them.  She states that  she cannot afford more Zetia; her  husband currently on Zetia and she  cannot afford another prescription.  She has also priced Welchol and  states that she cannot afford that.  We may consider trying her on some  Niaspan.  Further management will be per Dr. Oscar La.   PROBLEM #5 - HYPERTENSION:  Stable.   PROBLEM #6 - DIABETES:  Stable.           ______________________________  Vesta Mixer, M.D.     PJN/MEDQ  D:  05/20/2006  T:  05/21/2006  Job:  [id number]   cc:   Romeo Rabon

## 2011-04-09 HISTORY — PX: BREAST BIOPSY: SHX20

## 2011-08-21 ENCOUNTER — Inpatient Hospital Stay (HOSPITAL_COMMUNITY)
Admission: EM | Admit: 2011-08-21 | Discharge: 2011-08-23 | DRG: 683 | Disposition: A | Discharge status: Home / Self Care | Payer: Medicare Other | Attending: Internal Medicine | Admitting: Internal Medicine

## 2011-08-21 ENCOUNTER — Other Ambulatory Visit: Payer: Self-pay

## 2011-08-21 ENCOUNTER — Encounter (HOSPITAL_COMMUNITY): Payer: Self-pay | Admitting: *Deleted

## 2011-08-21 ENCOUNTER — Emergency Department (HOSPITAL_COMMUNITY): Payer: Medicare Other

## 2011-08-21 DIAGNOSIS — N289 Disorder of kidney and ureter, unspecified: Secondary | ICD-10-CM

## 2011-08-21 DIAGNOSIS — I4891 Unspecified atrial fibrillation: Secondary | ICD-10-CM | POA: Diagnosis present

## 2011-08-21 DIAGNOSIS — I252 Old myocardial infarction: Secondary | ICD-10-CM

## 2011-08-21 DIAGNOSIS — Z96649 Presence of unspecified artificial hip joint: Secondary | ICD-10-CM

## 2011-08-21 DIAGNOSIS — I10 Essential (primary) hypertension: Secondary | ICD-10-CM | POA: Diagnosis present

## 2011-08-21 DIAGNOSIS — L97809 Non-pressure chronic ulcer of other part of unspecified lower leg with unspecified severity: Secondary | ICD-10-CM | POA: Diagnosis present

## 2011-08-21 DIAGNOSIS — N179 Acute kidney failure, unspecified: Principal | ICD-10-CM | POA: Diagnosis present

## 2011-08-21 DIAGNOSIS — L97909 Non-pressure chronic ulcer of unspecified part of unspecified lower leg with unspecified severity: Secondary | ICD-10-CM

## 2011-08-21 DIAGNOSIS — Z7901 Long term (current) use of anticoagulants: Secondary | ICD-10-CM

## 2011-08-21 DIAGNOSIS — E119 Type 2 diabetes mellitus without complications: Secondary | ICD-10-CM | POA: Diagnosis present

## 2011-08-21 DIAGNOSIS — E875 Hyperkalemia: Secondary | ICD-10-CM | POA: Diagnosis present

## 2011-08-21 DIAGNOSIS — Z7982 Long term (current) use of aspirin: Secondary | ICD-10-CM

## 2011-08-21 DIAGNOSIS — I2510 Atherosclerotic heart disease of native coronary artery without angina pectoris: Secondary | ICD-10-CM | POA: Diagnosis present

## 2011-08-21 DIAGNOSIS — T3795XA Adverse effect of unspecified systemic anti-infective and antiparasitic, initial encounter: Secondary | ICD-10-CM | POA: Diagnosis present

## 2011-08-21 HISTORY — DX: Old myocardial infarction: I25.2

## 2011-08-21 LAB — COMPREHENSIVE METABOLIC PANEL
AST: 25 U/L (ref 0–37)
Alkaline Phosphatase: 74 U/L (ref 39–117)
BUN: 41 mg/dL — ABNORMAL HIGH (ref 6–23)
Calcium: 9.3 mg/dL (ref 8.4–10.5)
Chloride: 108 mEq/L (ref 96–112)
Creatinine, Ser: 1.73 mg/dL — ABNORMAL HIGH (ref 0.50–1.10)
Glucose, Bld: 209 mg/dL — ABNORMAL HIGH (ref 70–99)
Total Protein: 6.8 g/dL (ref 6.0–8.3)

## 2011-08-21 LAB — CBC
HCT: 37.2 % (ref 36.0–46.0)
Hemoglobin: 12.3 g/dL (ref 12.0–15.0)
MCHC: 33.1 g/dL (ref 30.0–36.0)
Platelets: 159 10*3/uL (ref 150–400)
RDW: 13.4 % (ref 11.5–15.5)
WBC: 5.6 10*3/uL (ref 4.0–10.5)

## 2011-08-21 LAB — URINALYSIS, ROUTINE W REFLEX MICROSCOPIC
Glucose, UA: 100 mg/dL — AB
Ketones, ur: NEGATIVE mg/dL
Leukocytes, UA: NEGATIVE
Nitrite: NEGATIVE
pH: 5.5 (ref 5.0–8.0)

## 2011-08-21 LAB — DIFFERENTIAL
Basophils Absolute: 0 10*3/uL (ref 0.0–0.1)
Basophils Relative: 0 % (ref 0–1)
Eosinophils Absolute: 0.2 10*3/uL (ref 0.0–0.7)
Lymphocytes Relative: 22 % (ref 12–46)
Monocytes Absolute: 0.8 10*3/uL (ref 0.1–1.0)
Neutro Abs: 3.4 10*3/uL (ref 1.7–7.7)
Neutrophils Relative %: 61 % (ref 43–77)

## 2011-08-21 LAB — POTASSIUM: Potassium: 6.9 mEq/L (ref 3.5–5.1)

## 2011-08-21 MED ORDER — OXYCODONE-ACETAMINOPHEN 5-325 MG PO TABS
1.0000 | ORAL_TABLET | Freq: Once | ORAL | Status: AC
  Administered 2011-08-21: 1 via ORAL
  Filled 2011-08-21: qty 1

## 2011-08-21 MED ORDER — SODIUM POLYSTYRENE SULFONATE 15 GM/60ML PO SUSP
30.0000 g | Freq: Once | ORAL | Status: AC
  Administered 2011-08-21: 30 g via ORAL
  Filled 2011-08-21: qty 120

## 2011-08-21 MED ORDER — SODIUM BICARBONATE 8.4 % IV SOLN
50.0000 meq | Freq: Once | INTRAVENOUS | Status: AC
  Administered 2011-08-21: 50 meq via INTRAVENOUS
  Filled 2011-08-21: qty 50

## 2011-08-21 MED ORDER — CALCIUM CHLORIDE 10 % IV SOLN
1.0000 g | Freq: Once | INTRAVENOUS | Status: AC
  Administered 2011-08-21: 1 g via INTRAVENOUS
  Filled 2011-08-21: qty 10

## 2011-08-21 MED ORDER — INSULIN ASPART 100 UNIT/ML IV SOLN
10.0000 [IU] | Freq: Once | INTRAVENOUS | Status: AC
  Administered 2011-08-21: 10 [IU] via INTRAVENOUS

## 2011-08-21 MED ORDER — DEXTROSE 50 % IV SOLN
INTRAVENOUS | Status: AC
  Filled 2011-08-21: qty 50

## 2011-08-21 MED ORDER — DEXTROSE 50 % IV SOLN
25.0000 g | Freq: Once | INTRAVENOUS | Status: AC
  Administered 2011-08-21: 25 g via INTRAVENOUS

## 2011-08-21 NOTE — ED Notes (Signed)
Called AC again about needed iv medication

## 2011-08-21 NOTE — ED Notes (Signed)
Pt reporting pain and wound on lower left leg.  Presently small ulcer on left calf. . Pt reports she has been to see PCP and is on antibiotic since 5/7.  Area around ulcer still reddned.  Pt states that she has been tested for MRSA and was negative.

## 2011-08-21 NOTE — ED Notes (Signed)
CRITICAL VALUE ALERT  Critical value received:  Potassium 7.3  Date of notification:  08/21/11  Time of notification:  2103  Critical value read back:yes  Nurse who received alert:  Thornton Dales, RN  MD notified (1st page):  Preston Fleeting  Time of first page:  2103  MD notified (2nd page):  Time of second page:  Responding MD:  glick  Time MD responded: 2103

## 2011-08-21 NOTE — ED Notes (Signed)
Pt complaining of on & off weakness since Friday. Weakness is in pt legs. Pain to her face that started to night. Pt states place on her does not appear to be getting better.

## 2011-08-21 NOTE — ED Provider Notes (Signed)
History   This chart was scribed for Dione Booze, MD scribed by Magnus Sinning. The patient was seen in room APA14/APA14 seen at 20:11    CSN: [id number]  Arrival date & time 08/21/11  1931   First MD Initiated Contact with Patient 08/21/11 1956      Chief Complaint  Patient presents with  . Leg Pain  . Skin Ulcer    (Consider location/radiation/quality/duration/timing/severity/associated sxs/prior treatment) HPI Kathryn Schroeder is a 78 y.o. female who presents to the Emergency Department complaining of a constant moderate lower left leg burning pain with an associated lower left leg sore, onset 2 weeks. Patient states the pain is modified when she externally rotates her leg and she currently rates her leg pain a 10/10. Patient says she has used an abx, which she has been taking as directed,as well as, neosporin, with no relief. States that her PCP checked for MRSA and that the test came back negative. Denies ambulatory problems, fever, chills, sweat, and CP. Reports additionally that 5 days ago she started having intermittent weakness since. Has h/o rheumatoid arthritis.  PCP: Dr. Romeo Rabon in [place]  Past Medical History  Diagnosis Date  . Coronary artery disease     STATUS POST OLD INFERIOR WALL M.I.  . Diabetes mellitus   . Gout   . Chronic anticoagulation   . Atrial fibrillation   . MI, old     Past Surgical History  Procedure Date  . Appendectomy   . Total hip arthroplasty     RIGHT HIP    Family History  Problem Relation Age of Onset  . Cancer Father 22    History  Substance Use Topics  . Smoking status: Never Smoker   . Smokeless tobacco: Not on file  . Alcohol Use: No   Review of Systems  All other systems reviewed and are negative.   10 Systems reviewed and are negative for acute change except as noted in the HPI. Allergies  Penicillins  Home Medications   Current Outpatient Rx  Name Route Sig Dispense Refill  . ASPIRIN 81 MG PO TABS  Oral Take 81 mg by mouth daily.      Marland Kitchen CARVEDILOL 12.5 MG PO TABS Oral Take 12.5 mg by mouth 2 (two) times daily with a meal.      . VITAMIN D3 400 UNITS PO CAPS Oral Take by mouth 2 (two) times daily.      . DORZOLAMIDE HCL-TIMOLOL MAL 22.3-6.8 MG/ML OP SOLN Both Eyes Place 1 drop into both eyes 2 (two) times daily.      Marland Kitchen LOSARTAN POTASSIUM 100 MG PO TABS Oral Take 25 mg by mouth daily.     Marland Kitchen MAGNESIUM OXIDE 400 MG PO TABS Oral Take 400 mg by mouth 2 (two) times daily.      Marland Kitchen METFORMIN HCL 500 MG PO TABS Oral Take 500 mg by mouth daily with breakfast.      . FISH OIL 1000 MG PO CAPS Oral Take by mouth daily.      . RED YEAST RICE EXTRACT 600 MG PO TABS Oral Take by mouth 2 (two) times daily.      . WARFARIN SODIUM 1 MG PO TABS Oral Take 5 mg by mouth as directed.       BP 117/63  Pulse 48  Temp(Src) 98.1 F (36.7 C) (Oral)  Resp 16  Ht 5\' 8"  (1.727 m)  Wt 216 lb (97.977 kg)  BMI 32.84 kg/m2  SpO2 98%  Physical Exam  Nursing note and vitals reviewed. Constitutional: She is oriented to person, place, and time. She appears well-developed and well-nourished. No distress.  HENT:  Head: Normocephalic and atraumatic.  Eyes: EOM are normal. Pupils are equal, round, and reactive to light.  Neck: Neck supple. No tracheal deviation present.  Cardiovascular: Normal rate.   Pulmonary/Chest: Effort normal. No respiratory distress.  Abdominal: Soft. She exhibits no distension.  Musculoskeletal: Normal range of motion. She exhibits no edema.       Venous stasis changes present. Capillary refill delayed to 6 sec. Dorsalis pedis pulse present, but weak 1+.  1.5 cm ulcer present in left calf w/o any drainage.  Neurological: She is alert and oriented to person, place, and time. No sensory deficit.       Decreased pinprick sensation on the feet up to midcalf consistent with peripheral neuropathy.  Skin: Skin is warm and dry.  Psychiatric: She has a normal mood and affect. Her behavior is normal.      ED Course  Procedures (including critical care time) DIAGNOSTIC STUDIES: Oxygen Saturation is 98% on room air, normal by my interpretation.    COORDINATION OF CARE: Medication Orders 2030: PERCOCET 5-325 MG per tablet 1 tablet Once  Results for orders placed during the hospital encounter of 08/21/11  CBC      Component Value Range   WBC 5.6  4.0 - 10.5 (K/uL)   RBC 3.95  3.87 - 5.11 (MIL/uL)   Hemoglobin 12.3  12.0 - 15.0 (g/dL)   HCT 16.1  09.6 - 04.5 (%)   MCV 94.2  78.0 - 100.0 (fL)   MCH 31.1  26.0 - 34.0 (pg)   MCHC 33.1  30.0 - 36.0 (g/dL)   RDW 40.9  81.1 - 91.4 (%)   Platelets 159  150 - 400 (K/uL)  DIFFERENTIAL      Component Value Range   Neutrophils Relative 61  43 - 77 (%)   Neutro Abs 3.4  1.7 - 7.7 (K/uL)   Lymphocytes Relative 22  12 - 46 (%)   Lymphs Abs 1.2  0.7 - 4.0 (K/uL)   Monocytes Relative 14 (*) 3 - 12 (%)   Monocytes Absolute 0.8  0.1 - 1.0 (K/uL)   Eosinophils Relative 3  0 - 5 (%)   Eosinophils Absolute 0.2  0.0 - 0.7 (K/uL)   Basophils Relative 0  0 - 1 (%)   Basophils Absolute 0.0  0.0 - 0.1 (K/uL)  COMPREHENSIVE METABOLIC PANEL      Component Value Range   Sodium 134 (*) 135 - 145 (mEq/L)   Potassium 7.3 (*) 3.5 - 5.1 (mEq/L)   Chloride 108  96 - 112 (mEq/L)   CO2 19  19 - 32 (mEq/L)   Glucose, Bld 209 (*) 70 - 99 (mg/dL)   BUN 41 (*) 6 - 23 (mg/dL)   Creatinine, Ser 7.82 (*) 0.50 - 1.10 (mg/dL)   Calcium 9.3  8.4 - 95.6 (mg/dL)   Total Protein 6.8  6.0 - 8.3 (g/dL)   Albumin 3.3 (*) 3.5 - 5.2 (g/dL)   AST 25  0 - 37 (U/L)   ALT 30  0 - 35 (U/L)   Alkaline Phosphatase 74  39 - 117 (U/L)   Total Bilirubin 0.2 (*) 0.3 - 1.2 (mg/dL)   GFR calc non Af Amer 27 (*) >90 (mL/min)   GFR calc Af Amer 31 (*) >90 (mL/min)  SEDIMENTATION RATE      Component Value Range  Sed Rate 55 (*) 0 - 22 (mm/hr)  URINALYSIS, ROUTINE W REFLEX MICROSCOPIC      Component Value Range   Color, Urine AMBER (*) YELLOW    APPearance HAZY (*) CLEAR     Specific Gravity, Urine 1.025  1.005 - 1.030    pH 5.5  5.0 - 8.0    Glucose, UA 100 (*) NEGATIVE (mg/dL)   Hgb urine dipstick NEGATIVE  NEGATIVE    Bilirubin Urine NEGATIVE  NEGATIVE    Ketones, ur NEGATIVE  NEGATIVE (mg/dL)   Protein, ur NEGATIVE  NEGATIVE (mg/dL)   Urobilinogen, UA 0.2  0.0 - 1.0 (mg/dL)   Nitrite NEGATIVE  NEGATIVE    Leukocytes, UA NEGATIVE  NEGATIVE   PROTIME-INR      Component Value Range   Prothrombin Time 22.2 (*) 11.6 - 15.2 (seconds)   INR 1.91 (*) 0.00 - 1.49   POTASSIUM      Component Value Range   Potassium 6.9 (*) 3.5 - 5.1 (mEq/L)   Dg Chest Portable 1 View  08/21/2011  *RADIOLOGY REPORT*  Clinical Data: Leg pain.  Skin ulcer.  PORTABLE CHEST - 1 VIEW  Comparison: Portable chest 05/15/2006.  Findings: The heart is mildly enlarged.  The right hemidiaphragm is slightly elevated.  The lungs are clear.  IMPRESSION: No acute cardiopulmonary disease or significant interval change.  Original Report Authenticated By: Jamesetta Orleans. MATTERN, M.D.     Date: 08/22/2011  Rate: 59  Rhythm: atrial fibrillation  QRS Axis: normal  Intervals: normal  ST/T Wave abnormalities: normal  Conduction Disutrbances:none  Narrative Interpretation: Low voltage, old anteroseptal myocardial infarction. When compared with ECG of 05/17/2006, no significant changes are seen.  Old EKG Reviewed: unchanged    1. Hyperkalemia   2. Renal insufficiency   3. Atrial fibrillation   4. Leg ulcer    CRITICAL CARE Performed by: ZOXWR,UEAVW   Total critical care time: 55 minutes  Critical care time was exclusive of separately billable procedures and treating other patients.  Critical care was necessary to treat or prevent imminent or life-threatening deterioration.  Critical care was time spent personally by me on the following activities: development of treatment plan with patient and/or surrogate as well as nursing, discussions with consultants, evaluation of patient's response  to treatment, examination of patient, obtaining history from patient or surrogate, ordering and performing treatments and interventions, ordering and review of laboratory studies, ordering and review of radiographic studies, pulse oximetry and re-evaluation of patient's condition.    MDM  Patient has clear evidence of both venous stasis and peripheral artery disease as well as evidence of peripheral neuropathy. Ulcer could be related to venous stasis and could also be related to warfarin. I do not see signs of infection but a sedimentation rate will be checked. A general workup will be done in light of patient's complaints of weakness.  Potassium has come back very high at 7.3. This will be repeated. However, in light of bradycardia, it is elected to initiate treatment of hyperkalemia before confirming with repeat labs. EKG does not show signs of hyperkalemia. She was given D50, insulin, calcium, and sodium bicarbonate. Repeat potassium confirms significant hyperkalemia and she's given a dose of Kayexalate. Case is discussed with Dr. Orvan Falconer of [facility] who agrees to admit the patient.   I personally performed the services described in this documentation, which was scribed in my presence. The recorded information has been reviewed and considered.          Onalee Hua  Preston Fleeting, MD 08/22/11 [id number]

## 2011-08-22 ENCOUNTER — Encounter (HOSPITAL_COMMUNITY): Payer: Self-pay | Admitting: Internal Medicine

## 2011-08-22 DIAGNOSIS — E119 Type 2 diabetes mellitus without complications: Secondary | ICD-10-CM | POA: Diagnosis present

## 2011-08-22 DIAGNOSIS — E875 Hyperkalemia: Secondary | ICD-10-CM | POA: Diagnosis present

## 2011-08-22 DIAGNOSIS — N179 Acute kidney failure, unspecified: Secondary | ICD-10-CM | POA: Diagnosis present

## 2011-08-22 DIAGNOSIS — I10 Essential (primary) hypertension: Secondary | ICD-10-CM

## 2011-08-22 LAB — CBC
Hemoglobin: 13 g/dL (ref 12.0–15.0)
Platelets: 165 10*3/uL (ref 150–400)
RBC: 4.15 MIL/uL (ref 3.87–5.11)
WBC: 6.1 10*3/uL (ref 4.0–10.5)

## 2011-08-22 LAB — PROTIME-INR
INR: 1.92 — ABNORMAL HIGH (ref 0.00–1.49)
Prothrombin Time: 22.3 seconds — ABNORMAL HIGH (ref 11.6–15.2)

## 2011-08-22 LAB — BASIC METABOLIC PANEL
BUN: 34 mg/dL — ABNORMAL HIGH (ref 6–23)
BUN: 32 mg/dL — ABNORMAL HIGH (ref 6–23)
CO2: 23 mEq/L (ref 19–32)
Calcium: 9.4 mg/dL (ref 8.4–10.5)
Chloride: 107 mEq/L (ref 96–112)
Chloride: 103 mEq/L (ref 96–112)
Creatinine, Ser: 1.5 mg/dL — ABNORMAL HIGH (ref 0.50–1.10)
GFR calc Af Amer: 32 mL/min — ABNORMAL LOW (ref >90)
GFR calc Af Amer: 37 mL/min — ABNORMAL LOW (ref >90)
GFR calc non Af Amer: 27 mL/min — ABNORMAL LOW (ref >90)
Potassium: 6.5 mEq/L (ref 3.5–5.1)

## 2011-08-22 LAB — MAGNESIUM
Magnesium: 1.8 mg/dL (ref 1.5–2.5)
Magnesium: 1.6 mg/dL (ref 1.5–2.5)

## 2011-08-22 LAB — HEMOGLOBIN A1C
Hgb A1c MFr Bld: 7.6 % — ABNORMAL HIGH (ref <5.7)
Mean Plasma Glucose: 171 mg/dL — ABNORMAL HIGH (ref <117)

## 2011-08-22 LAB — GLUCOSE, CAPILLARY

## 2011-08-22 LAB — LACTIC ACID, PLASMA: Lactic Acid, Venous: 1 mmol/L (ref 0.5–2.2)

## 2011-08-22 LAB — MRSA PCR SCREENING: MRSA by PCR: INVALID — AB

## 2011-08-22 MED ORDER — ACETAMINOPHEN 325 MG PO TABS: 650.0000 mg | ORAL_TABLET | ORAL | Status: DC | PRN

## 2011-08-22 MED ORDER — SODIUM POLYSTYRENE SULFONATE 15 GM/60ML PO SUSP
ORAL | Status: AC
  Filled 2011-08-22: qty 240

## 2011-08-22 MED ORDER — BRIMONIDINE TARTRATE 0.2 % OP SOLN
1.0000 [drp] | Freq: Two times a day (BID) | OPHTHALMIC | Status: DC
  Administered 2011-08-22 – 2011-08-23 (×3): 1 [drp] via OPHTHALMIC
  Filled 2011-08-22: qty 5

## 2011-08-22 MED ORDER — OMEGA-3-ACID ETHYL ESTERS 1 G PO CAPS
1.0000 g | ORAL_CAPSULE | Freq: Every day | ORAL | Status: DC
  Administered 2011-08-22 – 2011-08-23 (×2): 1 g via ORAL
  Filled 2011-08-22 (×2): qty 1

## 2011-08-22 MED ORDER — SODIUM CHLORIDE 0.45 % IV SOLN
INTRAVENOUS | Status: DC
  Administered 2011-08-22: 03:00:00 via INTRAVENOUS
  Filled 2011-08-22 (×2): qty 1000

## 2011-08-22 MED ORDER — WARFARIN SODIUM 5 MG PO TABS
5.0000 mg | ORAL_TABLET | Freq: Once | ORAL | Status: AC
  Administered 2011-08-22: 5 mg via ORAL
  Filled 2011-08-22: qty 1

## 2011-08-22 MED ORDER — SODIUM BICARBONATE 8.4 % IV SOLN
INTRAVENOUS | Status: AC
  Filled 2011-08-22: qty 50

## 2011-08-22 MED ORDER — WARFARIN - PHARMACIST DOSING INPATIENT: Freq: Every day | Status: DC

## 2011-08-22 MED ORDER — INSULIN ASPART 100 UNIT/ML ~~LOC~~ SOLN
0.0000 [IU] | Freq: Three times a day (TID) | SUBCUTANEOUS | Status: DC
  Administered 2011-08-22 – 2011-08-23 (×3): 2 [IU] via SUBCUTANEOUS

## 2011-08-22 MED ORDER — TIMOLOL MALEATE 0.5 % OP SOLN
1.0000 [drp] | Freq: Two times a day (BID) | OPHTHALMIC | Status: DC
  Administered 2011-08-22 – 2011-08-23 (×3): 1 [drp] via OPHTHALMIC
  Filled 2011-08-22: qty 5

## 2011-08-22 MED ORDER — TRAZODONE HCL 50 MG PO TABS: 25.0000 mg | ORAL_TABLET | Freq: Every evening | ORAL | Status: DC | PRN

## 2011-08-22 MED ORDER — OXYCODONE HCL 5 MG PO TABS
5.0000 mg | ORAL_TABLET | ORAL | Status: DC | PRN
  Administered 2011-08-22 (×2): 5 mg via ORAL
  Filled 2011-08-22 (×2): qty 1

## 2011-08-22 MED ORDER — DEXTROSE 5 % IV SOLN
INTRAVENOUS | Status: DC
  Administered 2011-08-22: 125 mL via INTRAVENOUS
  Administered 2011-08-22: 1000 mL via INTRAVENOUS

## 2011-08-22 MED ORDER — ONDANSETRON HCL 4 MG/2ML IJ SOLN: 4.0000 mg | INTRAMUSCULAR | Status: DC | PRN

## 2011-08-22 MED ORDER — CARVEDILOL 12.5 MG PO TABS
12.5000 mg | ORAL_TABLET | Freq: Two times a day (BID) | ORAL | Status: DC
  Administered 2011-08-22 – 2011-08-23 (×3): 12.5 mg via ORAL
  Filled 2011-08-22 (×3): qty 1

## 2011-08-22 MED ORDER — ASPIRIN 81 MG PO CHEW
81.0000 mg | CHEWABLE_TABLET | ORAL | Status: DC
  Administered 2011-08-22 – 2011-08-23 (×2): 81 mg via ORAL
  Filled 2011-08-22 (×2): qty 1

## 2011-08-22 MED ORDER — INSULIN ASPART 100 UNIT/ML ~~LOC~~ SOLN
0.0000 [IU] | Freq: Every day | SUBCUTANEOUS | Status: DC
  Administered 2011-08-22: 3 [IU] via SUBCUTANEOUS

## 2011-08-22 MED ORDER — SODIUM POLYSTYRENE SULFONATE 15 GM/60ML PO SUSP
30.0000 g | Freq: Once | ORAL | Status: AC
  Administered 2011-08-22: 30 g via ORAL

## 2011-08-22 MED ORDER — BRIMONIDINE TARTRATE-TIMOLOL 0.2-0.5 % OP SOLN: 1.0000 [drp] | Freq: Two times a day (BID) | OPHTHALMIC | Status: DC

## 2011-08-22 NOTE — Progress Notes (Signed)
Multiple Bowel movements since given kayexalate in ED

## 2011-08-22 NOTE — Consult Note (Signed)
WOC consult Note Reason for Consult: req video consult for sm wound posterior LLE surrounded by erythema Wound type: Full thickness of unknown etiology. Pt states she's had it 2-3 weeks and c/o burning relieved by position change. Hx Diabetes, HTN & on coumadin. Partial thickness wound also noted anterior to first wound. Pressure Ulcer POA: No Measurement: 1.5x.5cm (posterior) .5cm x .5cm (anterior) Wound bed: 100% eschar Drainage (amount, consistency, odor): no drainage or odor noted Periwound: erythematous otherwise intact; feet appear to have chronic edema Dressing procedure/placement/frequency: hydrogel covered with foam Qd  Vesta Mixer RN BSN, wound care student Stann Mainland not plan to follow further unless re-consulted.  [address], RN, MSN, [employer]  [phone]

## 2011-08-22 NOTE — ED Notes (Signed)
AC called for medications. 

## 2011-08-22 NOTE — Plan of Care (Signed)
Problem: Consults Goal: General Medical Patient Education See Patient Education Module for specific education. Outcome: Progressing Patient came to hospital for left leg burning and wound to posterior lower left leg.  Was on an antibiotic (Bactrim) prior to admission.  When arrived found in hyperkalemia with a potassium of 7.3 Goal: Skin Care Protocol Initiated - if indicated If consults are not indicated, leave blank or document N/A Outcome: Progressing Wound to lower left leg Goal: Diabetes Guidelines if Diabetic/Glucose > 140 If diabetic or lab glucose is > 140 mg/dl - Initiate Diabetes/Hyperglycemia Guidelines & Document Interventions  Outcome: Progressing Patient is a diabetic  Problem: Phase I Progression Outcomes Goal: Pain controlled with appropriate interventions Outcome: Progressing Given percocet in ED.  Only burning to leg at present, no pain Goal: OOB as tolerated unless otherwise ordered Outcome: Progressing BSC for multiple bm's due to kayexalate x 2 doses given in ED Goal: Initial discharge plan identified Outcome: Progressing Lives home alone, still drives a car

## 2011-08-22 NOTE — Progress Notes (Signed)
Subjective: This lady was admitted yesterday with significant hyperkalemia and ulcer in the left lower leg posteriorly with surrounding erythema. She had been put on Septra 9 days ago for cellulitis of the left lower leg. She says that the erythema has not improved significantly. On admission, her potassium was 7.3 but without any electrocardiographic changes. She takes magnesium oxide and she has chronic atrial fibrillation. Her potassium has improved to 6.5 after Kayexalate and bicarbonate with her IV fluids.           Physical Exam: Blood pressure 164/60, pulse 74, temperature 98.9 F (37.2 C), temperature source Oral, resp. rate 20, height 5\' 6"  (1.676 m), weight 109.7 kg (241 lb 13.5 oz), SpO2 94.00%. She looks systemically well. Heart sounds are present and atrial fibrillation. Lung fields are clear. She is alert and orientated. There is a ulcer in the left lower leg, posteriorly measuring 2-3 cm in diameter with surrounding erythema.   Investigations:     Basic Metabolic Panel:  Basename 08/22/11 0456 08/21/11 2055 08/21/11 2017  NA 139 -- 134*  K 6.5* 6.9* --  CL 107 -- 108  CO2 21 -- 19  GLUCOSE 149* -- 209*  BUN 34* -- 41*  CREATININE 1.71* -- 1.73*  CALCIUM 9.8 -- 9.3  MG 1.8 -- --  PHOS -- -- --   Liver Function Tests:  [facility] 08/21/11 2017  AST 25  ALT 30  ALKPHOS 74  BILITOT 0.2*  PROT 6.8  ALBUMIN 3.3*     CBC:  Basename 08/22/11 0456 08/21/11 2017  WBC 6.1 5.6  NEUTROABS -- 3.4  HGB 13.0 12.3  HCT 39.3 37.2  MCV 94.7 94.2  PLT 165 159    Dg Chest Portable 1 View  08/21/2011  *RADIOLOGY REPORT*  Clinical Data: Leg pain.  Skin ulcer.  PORTABLE CHEST - 1 VIEW  Comparison: Portable chest 05/15/2006.  Findings: The heart is mildly enlarged.  The right hemidiaphragm is slightly elevated.  The lungs are clear.  IMPRESSION: No acute cardiopulmonary disease or significant interval change.  Original Report Authenticated By: Jamesetta Orleans. MATTERN,  M.D.      Medications: I have reviewed the patient's current medications.  Impression: 1. Hyperkalemia, possibly related to Septra. Patient also has been taking magnesium oxide for several years. 2. Left lower leg posteriorly wound ulcer with surrounding erythema. Does not appear obviously cellulitic but more likely a chronic erythema. 3. Chronic atrial fibrillation on chronic anticoagulation. 4. History of coronary artery disease, status post myocardial infarction, stable. 5. Hypertension. 6. Acute renal failure.     Plan: 1. Change IV fluids to D5. 2. Recheck electrolytes and magnesium levels at noon. 3. Wound care consult.     LOS: 1 day   Wilson Singer Pager [phone]  08/22/2011, 7:40 AM

## 2011-08-22 NOTE — Consult Note (Signed)
ANTICOAGULATION CONSULT NOTE - Initial Consult  Pharmacy Consult for Coumadin Indication: atrial fibrillation, chronic anticoagulation  Allergies  Allergen Reactions  . Adhesive (Tape)     WHITE tape: Causes blisters and skin reaction  . Penicillins Itching and Rash    Childhood allergy:     Patient Measurements: Height: 5\' 6"  (167.6 cm) Weight: 241 lb 13.5 oz (109.7 kg) IBW/kg (Calculated) : 59.3   Vital Signs: Temp: 98.9 F (37.2 C) (05/16 0400) Temp src: Oral (05/16 0400) BP: 132/97 mmHg (05/16 0755) Pulse Rate: 82  (05/16 0755)  Labs:  Basename 08/22/11 0456 08/21/11 2055 08/21/11 2017  HGB 13.0 -- 12.3  HCT 39.3 -- 37.2  PLT 165 -- 159  APTT -- -- --  LABPROT 22.3* 22.2* --  INR 1.92* 1.91* --  HEPARINUNFRC -- -- --  CREATININE 1.71* -- 1.73*  CKTOTAL -- -- --  CKMB -- -- --  TROPONINI -- -- --    Estimated Creatinine Clearance: 34 ml/min (by C-G formula based on Cr of 1.71).   Medical History: Past Medical History  Diagnosis Date  . Coronary artery disease     STATUS POST OLD INFERIOR WALL M.I.  . Diabetes mellitus   . Gout   . Chronic anticoagulation   . Atrial fibrillation   . MI, old 2008    Medications:  Prescriptions prior to admission  Medication Sig Dispense Refill  . acetaminophen (TYLENOL) 325 MG tablet Take 325-650 mg by mouth daily as needed. For leg pain      . aspirin 81 MG tablet Take 81 mg by mouth every morning.       . brimonidine-timolol (COMBIGAN) 0.2-0.5 % ophthalmic solution Place 1 drop into both eyes 2 (two) times daily.      . carvedilol (COREG) 12.5 MG tablet Take 12.5 mg by mouth 2 (two) times daily with a meal.        . Cholecalciferol (VITAMIN D3) 400 UNITS CAPS Take by mouth every morning.       Marland Kitchen losartan (COZAAR) 100 MG tablet Take 25 mg by mouth daily.       . magnesium oxide (MAG-OX) 400 MG tablet Take 400 mg by mouth 2 (two) times daily.        . metFORMIN (GLUCOPHAGE) 500 MG tablet Take 1,000-1,500 mg by mouth  daily with supper.       . Omega-3 Fatty Acids (FISH OIL) 1000 MG CAPS Take 1 capsule by mouth every morning.       . Red Yeast Rice Extract (RED YEAST RICE PO) Take 1.2 g by mouth at bedtime.      . sulfamethoxazole-trimethoprim (BACTRIM DS,SEPTRA DS) 800-160 MG per tablet Take 1 tablet by mouth 2 (two) times daily.      Marland Kitchen warfarin (COUMADIN) 5 MG tablet Take 2.5 mg by mouth every evening. Until completion of antibiotic (Septra DS)        Assessment: INR slightly below goal  Goal of Therapy:  INR 2-3 Monitor platelets by anticoagulation protocol: Yes   Plan: Coumadin 5mg  today x 1 INR daily until stable and therapeutic CBC per protocol  Valrie Hart A 08/22/2011,9:12 AM

## 2011-08-22 NOTE — H&P (Signed)
PCP:   Romeo Rabon, MD, MD   Chief Complaint:  Painful rash left leg for 10 days   HPI: Kathryn Schroeder is an 78 y.o. female. Elderly Caucasian lady, History of diabetes, hypertension, coronary artery disease, atrial fibrillation on chronic warfarin. Visited her primary care physician about 8 days ago for sudden onset of a painful red rash on the left leg below the calf. She was started on Septra and pain medication and has almost completed the Septra and now presents to the emergency room because of concerns about a rash.  On evaluation this lady was found to have a potassium greater than 7, and abnormal renal function. She has received emergency treatment for the hyperkalemia and the hospitalist service was called to assist with management.  She denies fever cough or cold she denies chest pains or shortness of breath; denies nausea vomiting or diarrhea.  She denies any history of kidney problem, and her chronic medications include losartan and metformin.   she does sometimes have urged  Rewiew of Systems:  The patient denies anorexia, fever, weight loss, vision loss, decreased hearing, hoarseness, chest pain, syncope, dyspnea on exertion, peripheral edema, balance deficits, hemoptysis, abdominal pain, melena, hematochezia, severe indigestion/heartburn, hematuria, genital sores, muscle weakness, transient blindness,  depression, unusual weight change, abnormal bleeding, enlarged lymph nodes, angioedema, and breast masses.    Past Medical History  Diagnosis Date  . Coronary artery disease     STATUS POST OLD INFERIOR WALL M.I.  . Diabetes mellitus   . Gout   . Chronic anticoagulation   . Atrial fibrillation   . MI, old 2008    Past Surgical History  Procedure Date  . Appendectomy   . Total hip arthroplasty     RIGHT HIP    Medications:  HOME MEDS: Prior to Admission medications   Medication Sig Start Date End Date Taking? Authorizing Provider  acetaminophen  (TYLENOL) 325 MG tablet Take 325-650 mg by mouth daily as needed. For leg pain   Yes Historical Provider, MD  aspirin 81 MG tablet Take 81 mg by mouth every morning.    Yes Historical Provider, MD  brimonidine-timolol (COMBIGAN) 0.2-0.5 % ophthalmic solution Place 1 drop into both eyes 2 (two) times daily.   Yes Historical Provider, MD  carvedilol (COREG) 12.5 MG tablet Take 12.5 mg by mouth 2 (two) times daily with a meal.     Yes Historical Provider, MD  Cholecalciferol (VITAMIN D3) 400 UNITS CAPS Take by mouth every morning.    Yes Historical Provider, MD  losartan (COZAAR) 100 MG tablet Take 25 mg by mouth daily.    Yes Historical Provider, MD  magnesium oxide (MAG-OX) 400 MG tablet Take 400 mg by mouth 2 (two) times daily.     Yes Historical Provider, MD  metFORMIN (GLUCOPHAGE) 500 MG tablet Take 1,000-1,500 mg by mouth daily with supper.    Yes Historical Provider, MD  Omega-3 Fatty Acids (FISH OIL) 1000 MG CAPS Take 1 capsule by mouth every morning.    Yes Historical Provider, MD  Red Yeast Rice Extract (RED YEAST RICE PO) Take 1.2 g by mouth at bedtime.   Yes Historical Provider, MD  sulfamethoxazole-trimethoprim (BACTRIM DS,SEPTRA DS) 800-160 MG per tablet Take 1 tablet by mouth 2 (two) times daily.   Yes Historical Provider, MD  warfarin (COUMADIN) 5 MG tablet Take 2.5 mg by mouth every evening. Until completion of antibiotic (Septra DS)   Yes Historical Provider, MD     Allergies:  Allergies  Allergen Reactions  . Adhesive (Tape)     WHITE tape: Causes blisters and skin reaction  . Penicillins Itching and Rash    Childhood allergy:     Social History:   reports that she has never smoked. She does not have any smokeless tobacco history on file. She reports that she does not drink alcohol or use illicit drugs.  Family History: Family History  Problem Relation Age of Onset  . Cancer Father 13     Physical Exam: Filed Vitals:   08/21/11 2230 08/21/11 2250 08/21/11 2300  08/22/11 0030  BP: 119/70 126/54 138/56 155/55  Pulse: 69 60 66 63  Temp:      TempSrc:      Resp: 19 20 20 16   Height:      Weight:      SpO2: 96% 93% 98% 93%   Blood pressure 155/55, pulse 63, temperature 98.1 F (36.7 C), temperature source Oral, resp. rate 16, height 5\' 8"  (1.727 m), weight 97.977 kg (216 lb), SpO2 93.00%.  GEN:  Pleasant  elderly Caucasian lady  lying in the stretcher in no acute distress; cooperative with exam PSYCH:  alert and oriented x4; does not appear anxious or depressed; affect is appropriate. HEENT: Mucous membranes pink, dry and anicteric; PERRLA; EOM intact; no cervical lymphadenopathy nor thyromegaly Breasts:: Not examined CHEST WALL: No tenderness CHEST: Normal respiration, clear to auscultation bilaterally HEART:Irregularly irregular  rhythm; no murmurs rubs or gallops BACK: marked kyphosis or scoliosis; no CVA tenderness ABDOMEN: Obese, soft non-tender; no masses, no organomegaly, normal abdominal bowel sounds; no pannus; no intertriginous candida. Rectal Exam: Not done EXTREMITIES: age-appropriate arthropathy of the hands and knees; no edema; erythematous firm area in the mid leg but 8 cm, with a 3-4 cm area of central dry eschar, elevated rather than depressed; diabetic dermopathy bilateral  Genitalia: not examined PULSES: 2+ and symmetric SKIN:  well marginated 2 cm rash in the left upper chest, patient says has been there for 4 years  CNS: Cranial nerves 2-12 grossly intact no focal lateralizing neurologic deficit   Labs & Imaging Results for orders placed during the hospital encounter of 08/21/11 (from the past 48 hour(s))  CBC     Status: Normal   Collection Time   08/21/11  8:17 PM      Component Value Range Comment   WBC 5.6  4.0 - 10.5 (K/uL)    RBC 3.95  3.87 - 5.11 (MIL/uL)    Hemoglobin 12.3  12.0 - 15.0 (g/dL)    HCT 96.0  45.4 - 09.8 (%)    MCV 94.2  78.0 - 100.0 (fL)    MCH 31.1  26.0 - 34.0 (pg)    MCHC 33.1  30.0 - 36.0  (g/dL)    RDW 11.9  14.7 - 82.9 (%)    Platelets 159  150 - 400 (K/uL)   DIFFERENTIAL     Status: Abnormal   Collection Time   08/21/11  8:17 PM      Component Value Range Comment   Neutrophils Relative 61  43 - 77 (%)    Neutro Abs 3.4  1.7 - 7.7 (K/uL)    Lymphocytes Relative 22  12 - 46 (%)    Lymphs Abs 1.2  0.7 - 4.0 (K/uL)    Monocytes Relative 14 (*) 3 - 12 (%)    Monocytes Absolute 0.8  0.1 - 1.0 (K/uL)    Eosinophils Relative 3  0 - 5 (%)    Eosinophils  Absolute 0.2  0.0 - 0.7 (K/uL)    Basophils Relative 0  0 - 1 (%)    Basophils Absolute 0.0  0.0 - 0.1 (K/uL)   COMPREHENSIVE METABOLIC PANEL     Status: Abnormal   Collection Time   08/21/11  8:17 PM      Component Value Range Comment   Sodium 134 (*) 135 - 145 (mEq/L)    Potassium 7.3 (*) 3.5 - 5.1 (mEq/L)    Chloride 108  96 - 112 (mEq/L)    CO2 19  19 - 32 (mEq/L)    Glucose, Bld 209 (*) 70 - 99 (mg/dL)    BUN 41 (*) 6 - 23 (mg/dL)    Creatinine, Ser 1.61 (*) 0.50 - 1.10 (mg/dL)    Calcium 9.3  8.4 - 10.5 (mg/dL)    Total Protein 6.8  6.0 - 8.3 (g/dL)    Albumin 3.3 (*) 3.5 - 5.2 (g/dL)    AST 25  0 - 37 (U/L)    ALT 30  0 - 35 (U/L)    Alkaline Phosphatase 74  39 - 117 (U/L)    Total Bilirubin 0.2 (*) 0.3 - 1.2 (mg/dL)    GFR calc non Af Amer 27 (*) >90 (mL/min)    GFR calc Af Amer 31 (*) >90 (mL/min)   SEDIMENTATION RATE     Status: Abnormal   Collection Time   08/21/11  8:17 PM      Component Value Range Comment   Sed Rate 55 (*) 0 - 22 (mm/hr)   PROTIME-INR     Status: Abnormal   Collection Time   08/21/11  8:55 PM      Component Value Range Comment   Prothrombin Time 22.2 (*) 11.6 - 15.2 (seconds)    INR 1.91 (*) 0.00 - 1.49    POTASSIUM     Status: Abnormal   Collection Time   08/21/11  8:55 PM      Component Value Range Comment   Potassium 6.9 (*) 3.5 - 5.1 (mEq/L)   URINALYSIS, ROUTINE W REFLEX MICROSCOPIC     Status: Abnormal   Collection Time   08/21/11  9:55 PM      Component Value Range  Comment   Color, Urine AMBER (*) YELLOW  BIOCHEMICALS MAY BE AFFECTED BY COLOR   APPearance HAZY (*) CLEAR     Specific Gravity, Urine 1.025  1.005 - 1.030     pH 5.5  5.0 - 8.0     Glucose, UA 100 (*) NEGATIVE (mg/dL)    Hgb urine dipstick NEGATIVE  NEGATIVE     Bilirubin Urine NEGATIVE  NEGATIVE     Ketones, ur NEGATIVE  NEGATIVE (mg/dL)    Protein, ur NEGATIVE  NEGATIVE (mg/dL)    Urobilinogen, UA 0.2  0.0 - 1.0 (mg/dL)    Nitrite NEGATIVE  NEGATIVE     Leukocytes, UA NEGATIVE  NEGATIVE  MICROSCOPIC NOT DONE ON URINES WITH NEGATIVE PROTEIN, BLOOD, LEUKOCYTES, NITRITE, OR GLUCOSE <1000 mg/dL.   Dg Chest Portable 1 View  08/21/2011  *RADIOLOGY REPORT*  Clinical Data: Leg pain.  Skin ulcer.  PORTABLE CHEST - 1 VIEW  Comparison: Portable chest 05/15/2006.  Findings: The heart is mildly enlarged.  The right hemidiaphragm is slightly elevated.  The lungs are clear.  IMPRESSION: No acute cardiopulmonary disease or significant interval change.  Original Report Authenticated By: Jamesetta Orleans. MATTERN, M.D.    EKG shows bradycardia, no peaked T waves, chronic atrial fibrillation.   Assessment  Present on Admission:   .ARF (acute renal failure) .Hyperkalemia   .Coronary artery disease .Type II or unspecified type diabetes mellitus without mention of complication, not stated as uncontrolled .Atrial fibrillation .HTN (hypertension)   PLAN:  will admit this lady for hydration with bicarbonate and fluid, will give additional Kayexalate, and repeat her potassium in the morning.  Will check plasma lactate is of her metformin use, hold and metformin , and give sliding scale as needed for the first 24 hrs.  Discontinue losartan metformin and Septra for the time being   Other plans as per orders.  Critical care time: 60 minutes.  Kasem Mozer 08/22/2011, 1:02 AM

## 2011-08-23 DIAGNOSIS — E875 Hyperkalemia: Secondary | ICD-10-CM

## 2011-08-23 DIAGNOSIS — I10 Essential (primary) hypertension: Secondary | ICD-10-CM

## 2011-08-23 DIAGNOSIS — N179 Acute kidney failure, unspecified: Secondary | ICD-10-CM

## 2011-08-23 DIAGNOSIS — E119 Type 2 diabetes mellitus without complications: Secondary | ICD-10-CM

## 2011-08-23 LAB — COMPREHENSIVE METABOLIC PANEL
AST: 19 U/L (ref 0–37)
Albumin: 3.1 g/dL — ABNORMAL LOW (ref 3.5–5.2)
Alkaline Phosphatase: 56 U/L (ref 39–117)
Chloride: 102 mEq/L (ref 96–112)
Potassium: 4.6 mEq/L (ref 3.5–5.1)
Total Bilirubin: 0.3 mg/dL (ref 0.3–1.2)

## 2011-08-23 LAB — CBC
HCT: 39.5 % (ref 36.0–46.0)
Platelets: 146 10*3/uL — ABNORMAL LOW (ref 150–400)
RDW: 13.3 % (ref 11.5–15.5)
WBC: 5.5 10*3/uL (ref 4.0–10.5)

## 2011-08-23 LAB — GLUCOSE, CAPILLARY

## 2011-08-23 MED ORDER — LOSARTAN POTASSIUM 25 MG PO TABS
25.0000 mg | ORAL_TABLET | Freq: Every day | ORAL | Status: DC
  Administered 2011-08-23: 25 mg via ORAL
  Filled 2011-08-23 (×4): qty 1

## 2011-08-23 NOTE — Progress Notes (Signed)
Pt to be discharged home per MD order. All discharge instructions gone over with patient and patients son. Instructions given to patient and patients son on how to change dressing on leg. All questions and concerns answered. Pt discharged home via wheelchair.

## 2011-08-23 NOTE — Plan of Care (Signed)
Problem: Phase II Progression Outcomes Goal: Progress activity as tolerated unless otherwise ordered Outcome: Progressing Patient oob to bsc with minimal assist, mild weakness due to left leg wound Goal: Discharge plan established Outcome: Progressing Discharge to home.  Lives alone, family support Goal: Vital signs remain stable Outcome: Progressing Afib with low RVR continues

## 2011-08-23 NOTE — Discharge Instructions (Signed)
Sodium-Controlled Diet       Sodium is a mineral. It is found in many foods. Sodium may be found naturally or added during the making of a food. The most common form of sodium is salt, which is made up of sodium and chloride. Reducing your sodium intake involves changing your eating habits.   The following guidelines will help you reduce the sodium in your diet:     Stop using the salt shaker.   Use salt sparingly in cooking and baking.   Substitute with sodium-free seasonings and spices.   Do not use a salt substitute (potassium chloride) without your caregiver's permission.   Include a variety of fresh, unprocessed foods in your diet.   Limit the use of processed and convenience foods that are high in sodium.    USE THE FOLLOWING FOODS SPARINGLY:     Breads/Starches     Commercial bread stuffing, commercial pancake or waffle mixes, coating mixes. Waffles. Croutons. Prepared (boxed or frozen) potato, rice, or noodle mixes that contain salt or sodium. Salted French fries or hash browns. Salted popcorn, breads, crackers, chips, or snack foods.    Vegetables     Vegetables canned with salt or prepared in cream, butter, or cheese sauces. Sauerkraut. Tomato or vegetable juices canned with salt.   Fresh vegetables are allowed if rinsed thoroughly.    Fruit     Fruit is okay to eat.    Meat and Meat Substitutes     Salted or smoked meats, such as bacon or Canadian bacon, chipped or corned beef, hot dogs, salt pork, luncheon meats, pastrami, ham, or sausage. Canned or smoked fish, poultry, or meat. Processed cheese or cheese spreads, blue or Roquefort cheese. Battered or frozen fish products. Prepared spaghetti sauce. Baked beans. Reuben sandwiches. Salted nuts. Caviar.  Milk     Limit buttermilk to 1 cup per week.    Soups and Combination Foods     Bouillon cubes, canned or dried soups, broth, consomm. Convenience (frozen or packaged) dinners with more than 600 mg sodium. Pot pies, pizza, Asian food, fast food  cheeseburgers, and specialty sandwiches.  Desserts and Sweets     Regular (salted) desserts, pie, commercial fruit snack pies, commercial snack cakes, canned puddings.   Eat desserts and sweets in moderation.    Fats and Oils     Gravy mixes or canned gravy. No more than 1 to 2 tbs of salad dressing. Chip dips.   Eat fats and oils in moderation.    Beverages     See those listed under the vegetables and milk groups.    Condiments     Ketchup, mustard, meat sauces, salsa, regular (salted) and lite soy sauce or mustard. Dill pickles, olives, meat tenderizer. Prepared horseradish or pickle relish. Dutch-processed cocoa. Baking powder or baking soda used medicinally. Worcestershire sauce. "Light" salt. Salt substitute, unless approved by your caregiver.      Document Released: 09/14/2001 Document Revised: 03/14/2011 Document Reviewed: 04/17/2009   [employer] Patient Information 2012 [employer].

## 2011-08-23 NOTE — Discharge Summary (Signed)
Physician Discharge Summary  Patient ID: Kathryn Schroeder MRN: [id number] DOB/AGE: 04-16-1930 78 y.o. Primary Care Physician:CAPLAN,MICHAEL, MD, MD Admit date: 08/21/2011 Discharge date: 08/23/2011    Discharge Diagnoses:  1. Hyperkalemia, resolved. 2. Left lower leg wound ulcer, chronic with surrounding erythema. 3. Type 2 diabetes mellitus, diet controlled. 4. Hypertension. 5. Acute renal failure, significantly improved, secondary to dehydration. 6. Atrial fibrillation, on chronic  anticoagulation.   Medication List  As of 08/23/2011  7:48 AM   STOP taking these medications         sulfamethoxazole-trimethoprim 800-160 MG per tablet         TAKE these medications         acetaminophen 325 MG tablet   Commonly known as: TYLENOL   Take 325-650 mg by mouth daily as needed. For leg pain      aspirin 81 MG tablet   Take 81 mg by mouth every morning.      carvedilol 12.5 MG tablet   Commonly known as: COREG   Take 12.5 mg by mouth 2 (two) times daily with a meal.      COMBIGAN 0.2-0.5 % ophthalmic solution   Generic drug: brimonidine-timolol   Place 1 drop into both eyes 2 (two) times daily.      Fish Oil 1000 MG Caps   Take 1 capsule by mouth every morning.      losartan 100 MG tablet   Commonly known as: COZAAR   Take 25 mg by mouth daily.      magnesium oxide 400 MG tablet   Commonly known as: MAG-OX   Take 400 mg by mouth 2 (two) times daily.      metFORMIN 500 MG tablet   Commonly known as: GLUCOPHAGE   Take 1,000-1,500 mg by mouth daily with supper.      RED YEAST RICE PO   Take 1.2 g by mouth at bedtime.      Vitamin D3 400 UNITS Caps   Take by mouth every morning.      warfarin 5 MG tablet   Commonly known as: COUMADIN   Take 2.5 mg by mouth every evening. Until completion of antibiotic (Septra DS)            Discharged Condition: Stable and improved.    Consults: None.  Significant Diagnostic Studies: Dg Chest Portable 1  View  08/21/2011  *RADIOLOGY REPORT*  Clinical Data: Leg pain.  Skin ulcer.  PORTABLE CHEST - 1 VIEW  Comparison: Portable chest 05/15/2006.  Findings: The heart is mildly enlarged.  The right hemidiaphragm is slightly elevated.  The lungs are clear.  IMPRESSION: No acute cardiopulmonary disease or significant interval change.  Original Report Authenticated By: Jamesetta Orleans. MATTERN, M.D.    Lab Results: Basic Metabolic Panel:  Basename 08/23/11 0630 08/22/11 1147 08/22/11 0456  NA 133* 132* --  K 4.6 5.4* --  CL 102 103 --  CO2 18* 23 --  GLUCOSE 201* 194* --  BUN 25* 32* --  CREATININE 1.28* 1.50* --  CALCIUM 8.9 9.4 --  MG -- 1.6 1.8  PHOS -- -- --   Liver Function Tests:  Basename 08/23/11 0630 08/21/11 2017  AST 19 25  ALT 22 30  ALKPHOS 56 74  BILITOT 0.3 0.2*  PROT 6.2 6.8  ALBUMIN 3.1* 3.3*     CBC:  Basename 08/23/11 0630 08/22/11 0456 08/21/11 2017  WBC 5.5 6.1 --  NEUTROABS -- -- 3.4  HGB 13.2 13.0 --  HCT 39.5  39.3 --  MCV 93.2 94.7 --  PLT 146* 165 --    Recent Results (from the past 240 hour(s))  MRSA PCR SCREENING     Status: Abnormal   Collection Time   08/22/11  1:26 AM      Component Value Range Status Comment   MRSA by PCR INVALID RESULTS, SPECIMEN SENT FOR CULTURE (*) NEGATIVE  Final      Hospital Course: This very pleasant 78 year old lady was admitted with symptoms of a painful rash in the left leg for 10 days. She had been treated with Septra and analgesics, she completed the course of Septra. When she was evaluated in the emergency room her potassium was greater than 7 with a abnormal renal function. She was appropriately treated for hyperkalemia with a combination of Kayexalate, IV fluids, in particular dextrose and also insulin. This has improved her potassium to a normal level now and her acute renal failure has also resolved. She feels much improved. She was evaluated by the wound care nurse in regarding to her left lower leg wound ulcer  which is posteriorly and the findings and recommendations are as follows: WOC consult Note  Reason for Consult: req video consult for sm wound posterior LLE surrounded by erythema  Wound type: Full thickness of unknown etiology. Pt states she's had it 2-3 weeks and c/o burning relieved by position change. Hx Diabetes, HTN & on coumadin. Partial thickness wound also noted anterior to first wound.  Pressure Ulcer POA: No  Measurement: 1.5x.5cm (posterior) .5cm x .5cm (anterior)  Wound bed: 100% eschar  Drainage (amount, consistency, odor): no drainage or odor noted  Periwound: erythematous otherwise intact; feet appear to have chronic edema  Dressing procedure/placement/frequency: hydrogel covered with foam Qd Today she feels well and is stable to be discharged.  Discharge Exam: Blood pressure 157/78, pulse 65, temperature 98.2 F (36.8 C), temperature source Oral, resp. rate 17, height 5\' 6"  (1.676 m), weight 102 kg (224 lb 13.9 oz), SpO2 94.00%. She looks systemically well. Heart sounds are present and normal. Lung fields clear. She is not septic or toxic. She is alert and orientated.  Disposition: Home. She will followup with her primary care physician in [place]. Also recommend that she see a dermatologist in the next week or so to see if he has any further recommendations. Her potassium will need to be monitored as will her renal function.  Discharge Orders    Future Orders Please Complete By Expires   Diet - low sodium heart healthy      Increase activity slowly           SignedWilson Singer Pager [phone]  08/23/2011, 7:48 AM

## 2011-08-25 LAB — MRSA CULTURE

## 2011-10-17 ENCOUNTER — Inpatient Hospital Stay (HOSPITAL_COMMUNITY)
Admission: EM | Admit: 2011-10-17 | Discharge: 2011-10-25 | DRG: 602 | Disposition: A | Discharge status: Skilled Nursing Facility | Payer: Medicare Other | Attending: Internal Medicine | Admitting: Internal Medicine

## 2011-10-17 ENCOUNTER — Encounter (HOSPITAL_COMMUNITY): Payer: Self-pay | Admitting: Nurse Practitioner

## 2011-10-17 DIAGNOSIS — E875 Hyperkalemia: Secondary | ICD-10-CM | POA: Diagnosis present

## 2011-10-17 DIAGNOSIS — I7409 Other arterial embolism and thrombosis of abdominal aorta: Secondary | ICD-10-CM | POA: Diagnosis present

## 2011-10-17 DIAGNOSIS — I4891 Unspecified atrial fibrillation: Secondary | ICD-10-CM | POA: Diagnosis present

## 2011-10-17 DIAGNOSIS — L02419 Cutaneous abscess of limb, unspecified: Principal | ICD-10-CM | POA: Diagnosis present

## 2011-10-17 DIAGNOSIS — J9601 Acute respiratory failure with hypoxia: Secondary | ICD-10-CM | POA: Diagnosis not present

## 2011-10-17 DIAGNOSIS — L97909 Non-pressure chronic ulcer of unspecified part of unspecified lower leg with unspecified severity: Secondary | ICD-10-CM

## 2011-10-17 DIAGNOSIS — M79609 Pain in unspecified limb: Secondary | ICD-10-CM

## 2011-10-17 DIAGNOSIS — L0291 Cutaneous abscess, unspecified: Secondary | ICD-10-CM

## 2011-10-17 DIAGNOSIS — L97919 Non-pressure chronic ulcer of unspecified part of right lower leg with unspecified severity: Secondary | ICD-10-CM

## 2011-10-17 DIAGNOSIS — N289 Disorder of kidney and ureter, unspecified: Secondary | ICD-10-CM

## 2011-10-17 DIAGNOSIS — I252 Old myocardial infarction: Secondary | ICD-10-CM

## 2011-10-17 DIAGNOSIS — B961 Klebsiella pneumoniae [K. pneumoniae] as the cause of diseases classified elsewhere: Secondary | ICD-10-CM | POA: Diagnosis present

## 2011-10-17 DIAGNOSIS — L97209 Non-pressure chronic ulcer of unspecified calf with unspecified severity: Secondary | ICD-10-CM

## 2011-10-17 DIAGNOSIS — A4902 Methicillin resistant Staphylococcus aureus infection, unspecified site: Secondary | ICD-10-CM | POA: Diagnosis present

## 2011-10-17 DIAGNOSIS — I779 Disorder of arteries and arterioles, unspecified: Secondary | ICD-10-CM | POA: Diagnosis present

## 2011-10-17 DIAGNOSIS — Z96649 Presence of unspecified artificial hip joint: Secondary | ICD-10-CM

## 2011-10-17 DIAGNOSIS — I2510 Atherosclerotic heart disease of native coronary artery without angina pectoris: Secondary | ICD-10-CM

## 2011-10-17 DIAGNOSIS — Z7901 Long term (current) use of anticoagulants: Secondary | ICD-10-CM

## 2011-10-17 DIAGNOSIS — L0390 Cellulitis, unspecified: Secondary | ICD-10-CM | POA: Diagnosis present

## 2011-10-17 DIAGNOSIS — Z7982 Long term (current) use of aspirin: Secondary | ICD-10-CM

## 2011-10-17 DIAGNOSIS — I83009 Varicose veins of unspecified lower extremity with ulcer of unspecified site: Secondary | ICD-10-CM | POA: Diagnosis present

## 2011-10-17 DIAGNOSIS — M79606 Pain in leg, unspecified: Secondary | ICD-10-CM | POA: Diagnosis present

## 2011-10-17 DIAGNOSIS — E119 Type 2 diabetes mellitus without complications: Secondary | ICD-10-CM | POA: Diagnosis present

## 2011-10-17 DIAGNOSIS — N179 Acute kidney failure, unspecified: Secondary | ICD-10-CM

## 2011-10-17 DIAGNOSIS — J9600 Acute respiratory failure, unspecified whether with hypoxia or hypercapnia: Secondary | ICD-10-CM | POA: Diagnosis not present

## 2011-10-17 DIAGNOSIS — I872 Venous insufficiency (chronic) (peripheral): Secondary | ICD-10-CM | POA: Diagnosis present

## 2011-10-17 DIAGNOSIS — G9341 Metabolic encephalopathy: Secondary | ICD-10-CM | POA: Diagnosis not present

## 2011-10-17 DIAGNOSIS — D72829 Elevated white blood cell count, unspecified: Secondary | ICD-10-CM | POA: Diagnosis present

## 2011-10-17 DIAGNOSIS — I10 Essential (primary) hypertension: Secondary | ICD-10-CM | POA: Diagnosis present

## 2011-10-17 DIAGNOSIS — R609 Edema, unspecified: Secondary | ICD-10-CM | POA: Diagnosis not present

## 2011-10-17 DIAGNOSIS — I739 Peripheral vascular disease, unspecified: Secondary | ICD-10-CM | POA: Diagnosis present

## 2011-10-17 DIAGNOSIS — L03119 Cellulitis of unspecified part of limb: Secondary | ICD-10-CM

## 2011-10-17 DIAGNOSIS — Z79899 Other long term (current) drug therapy: Secondary | ICD-10-CM

## 2011-10-17 LAB — CBC WITH DIFFERENTIAL/PLATELET
Basophils Relative: 0 % (ref 0–1)
Hemoglobin: 12.6 g/dL (ref 12.0–15.0)
Lymphs Abs: 1.2 10*3/uL (ref 0.7–4.0)
Monocytes Relative: 13 % — ABNORMAL HIGH (ref 3–12)
Neutro Abs: 9.8 10*3/uL — ABNORMAL HIGH (ref 1.7–7.7)
Neutrophils Relative %: 77 % (ref 43–77)
RBC: 4.05 MIL/uL (ref 3.87–5.11)

## 2011-10-17 LAB — COMPREHENSIVE METABOLIC PANEL
ALT: 9 U/L (ref 0–35)
Albumin: 3.6 g/dL (ref 3.5–5.2)
Alkaline Phosphatase: 79 U/L (ref 39–117)
BUN: 34 mg/dL — ABNORMAL HIGH (ref 6–23)
Chloride: 97 mEq/L (ref 96–112)
Glucose, Bld: 228 mg/dL — ABNORMAL HIGH (ref 70–99)
Potassium: 5 mEq/L (ref 3.5–5.1)
Total Bilirubin: 0.9 mg/dL (ref 0.3–1.2)

## 2011-10-17 LAB — SEDIMENTATION RATE: Sed Rate: 92 mm/hr — ABNORMAL HIGH (ref 0–22)

## 2011-10-17 LAB — URINE MICROSCOPIC-ADD ON

## 2011-10-17 LAB — URINALYSIS, ROUTINE W REFLEX MICROSCOPIC
Glucose, UA: 250 mg/dL — AB
Protein, ur: 100 mg/dL — AB
Specific Gravity, Urine: 1.023 (ref 1.005–1.030)
pH: 5 (ref 5.0–8.0)

## 2011-10-17 LAB — GLUCOSE, CAPILLARY: Glucose-Capillary: 250 mg/dL — ABNORMAL HIGH (ref 70–99)

## 2011-10-17 MED ORDER — SODIUM CHLORIDE 0.9 % IJ SOLN
3.0000 mL | Freq: Two times a day (BID) | INTRAMUSCULAR | Status: DC
  Administered 2011-10-18 – 2011-10-23 (×5): 3 mL via INTRAVENOUS
  Administered 2011-10-23: 22:00:00 via INTRAVENOUS
  Administered 2011-10-24 – 2011-10-25 (×3): 3 mL via INTRAVENOUS

## 2011-10-17 MED ORDER — TIMOLOL MALEATE 0.5 % OP SOLN
1.0000 [drp] | Freq: Two times a day (BID) | OPHTHALMIC | Status: DC
  Administered 2011-10-17 – 2011-10-25 (×15): 1 [drp] via OPHTHALMIC
  Filled 2011-10-17 (×4): qty 5

## 2011-10-17 MED ORDER — LEVOFLOXACIN IN D5W 750 MG/150ML IV SOLN
750.0000 mg | INTRAVENOUS | Status: DC
  Administered 2011-10-17: 750 mg via INTRAVENOUS
  Filled 2011-10-17 (×2): qty 150

## 2011-10-17 MED ORDER — SODIUM CHLORIDE 0.9 % IJ SOLN: 3.0000 mL | INTRAMUSCULAR | Status: DC | PRN

## 2011-10-17 MED ORDER — WARFARIN - PHARMACIST DOSING INPATIENT
Freq: Every day | Status: DC
  Administered 2011-10-18: 18:00:00

## 2011-10-17 MED ORDER — MAGNESIUM OXIDE 400 (241.3 MG) MG PO TABS
400.0000 mg | ORAL_TABLET | Freq: Two times a day (BID) | ORAL | Status: DC
  Administered 2011-10-17 – 2011-10-25 (×16): 400 mg via ORAL
  Filled 2011-10-17 (×18): qty 1

## 2011-10-17 MED ORDER — VANCOMYCIN HCL 10 G IV SOLR
1000.0000 mg | Freq: Once | INTRAVENOUS | Status: DC
  Filled 2011-10-17: qty 1000

## 2011-10-17 MED ORDER — BISACODYL 5 MG PO TBEC: 5.0000 mg | DELAYED_RELEASE_TABLET | Freq: Every day | ORAL | Status: DC | PRN

## 2011-10-17 MED ORDER — HYDROMORPHONE HCL PF 1 MG/ML IJ SOLN
1.0000 mg | INTRAMUSCULAR | Status: AC | PRN
  Administered 2011-10-17 – 2011-10-18 (×2): 1 mg via INTRAVENOUS
  Filled 2011-10-17 (×3): qty 1

## 2011-10-17 MED ORDER — SENNOSIDES-DOCUSATE SODIUM 8.6-50 MG PO TABS
1.0000 | ORAL_TABLET | Freq: Every evening | ORAL | Status: DC | PRN
  Filled 2011-10-17: qty 1

## 2011-10-17 MED ORDER — CARVEDILOL 12.5 MG PO TABS
12.5000 mg | ORAL_TABLET | Freq: Two times a day (BID) | ORAL | Status: DC
  Administered 2011-10-18 – 2011-10-25 (×14): 12.5 mg via ORAL
  Filled 2011-10-17 (×19): qty 1

## 2011-10-17 MED ORDER — VANCOMYCIN HCL IN DEXTROSE 1-5 GM/200ML-% IV SOLN
1000.0000 mg | Freq: Once | INTRAVENOUS | Status: AC
  Administered 2011-10-17: 1000 mg via INTRAVENOUS
  Filled 2011-10-17: qty 200

## 2011-10-17 MED ORDER — ACETAMINOPHEN 650 MG RE SUPP: 650.0000 mg | Freq: Four times a day (QID) | RECTAL | Status: DC | PRN

## 2011-10-17 MED ORDER — ASPIRIN 81 MG PO TABS: 81.0000 mg | ORAL_TABLET | ORAL | Status: DC

## 2011-10-17 MED ORDER — ONDANSETRON HCL 4 MG/2ML IJ SOLN
4.0000 mg | Freq: Once | INTRAMUSCULAR | Status: AC
  Administered 2011-10-17: 4 mg via INTRAVENOUS
  Filled 2011-10-17: qty 2

## 2011-10-17 MED ORDER — INSULIN ASPART 100 UNIT/ML ~~LOC~~ SOLN
0.0000 [IU] | Freq: Three times a day (TID) | SUBCUTANEOUS | Status: DC
  Administered 2011-10-18 (×3): 2 [IU] via SUBCUTANEOUS
  Administered 2011-10-19: 3 [IU] via SUBCUTANEOUS
  Administered 2011-10-19: 2 [IU] via SUBCUTANEOUS
  Administered 2011-10-19: 3 [IU] via SUBCUTANEOUS
  Administered 2011-10-20: 5 [IU] via SUBCUTANEOUS
  Administered 2011-10-20: 3 [IU] via SUBCUTANEOUS
  Administered 2011-10-20: 2 [IU] via SUBCUTANEOUS
  Administered 2011-10-21 (×2): 1 [IU] via SUBCUTANEOUS
  Administered 2011-10-21: 2 [IU] via SUBCUTANEOUS
  Administered 2011-10-22: 1 [IU] via SUBCUTANEOUS
  Administered 2011-10-22: 5 [IU] via SUBCUTANEOUS
  Administered 2011-10-22: 2 [IU] via SUBCUTANEOUS
  Administered 2011-10-23: 5 [IU] via SUBCUTANEOUS
  Administered 2011-10-23: 9 [IU] via SUBCUTANEOUS

## 2011-10-17 MED ORDER — BRIMONIDINE TARTRATE 0.2 % OP SOLN
1.0000 [drp] | Freq: Two times a day (BID) | OPHTHALMIC | Status: DC
  Administered 2011-10-17 – 2011-10-25 (×15): 1 [drp] via OPHTHALMIC
  Filled 2011-10-17 (×3): qty 5

## 2011-10-17 MED ORDER — HYDROMORPHONE HCL PF 1 MG/ML IJ SOLN
1.0000 mg | Freq: Once | INTRAMUSCULAR | Status: AC
  Administered 2011-10-17: 1 mg via INTRAVENOUS
  Filled 2011-10-17: qty 1

## 2011-10-17 MED ORDER — MAGNESIUM OXIDE 400 MG PO TABS: 400.0000 mg | ORAL_TABLET | Freq: Two times a day (BID) | ORAL | Status: DC

## 2011-10-17 MED ORDER — ONDANSETRON HCL 4 MG/2ML IJ SOLN: 4.0000 mg | Freq: Three times a day (TID) | INTRAMUSCULAR | Status: DC | PRN

## 2011-10-17 MED ORDER — ACETAMINOPHEN 325 MG PO TABS
650.0000 mg | ORAL_TABLET | Freq: Four times a day (QID) | ORAL | Status: DC | PRN
  Administered 2011-10-22 – 2011-10-24 (×8): 650 mg via ORAL
  Filled 2011-10-17 (×8): qty 2

## 2011-10-17 MED ORDER — MORPHINE SULFATE 2 MG/ML IJ SOLN
2.0000 mg | INTRAMUSCULAR | Status: DC | PRN
  Administered 2011-10-17 – 2011-10-20 (×7): 2 mg via INTRAVENOUS
  Filled 2011-10-17 (×9): qty 1

## 2011-10-17 MED ORDER — BRIMONIDINE TARTRATE-TIMOLOL 0.2-0.5 % OP SOLN: 1.0000 [drp] | Freq: Two times a day (BID) | OPHTHALMIC | Status: DC

## 2011-10-17 MED ORDER — ALUM & MAG HYDROXIDE-SIMETH 200-200-20 MG/5ML PO SUSP
30.0000 mL | Freq: Four times a day (QID) | ORAL | Status: DC | PRN
  Filled 2011-10-17: qty 30

## 2011-10-17 MED ORDER — ONDANSETRON HCL 4 MG PO TABS
4.0000 mg | ORAL_TABLET | Freq: Four times a day (QID) | ORAL | Status: DC | PRN
  Administered 2011-10-25: 4 mg via ORAL
  Filled 2011-10-17: qty 1

## 2011-10-17 MED ORDER — ASPIRIN 81 MG PO CHEW
81.0000 mg | CHEWABLE_TABLET | Freq: Every day | ORAL | Status: DC
  Administered 2011-10-18 – 2011-10-25 (×8): 81 mg via ORAL
  Filled 2011-10-17 (×9): qty 1

## 2011-10-17 MED ORDER — HYDROCODONE-ACETAMINOPHEN 5-325 MG PO TABS
1.0000 | ORAL_TABLET | ORAL | Status: DC | PRN
  Administered 2011-10-17: 2 via ORAL
  Filled 2011-10-17: qty 2

## 2011-10-17 MED ORDER — ONDANSETRON HCL 4 MG/2ML IJ SOLN
4.0000 mg | Freq: Four times a day (QID) | INTRAMUSCULAR | Status: DC | PRN
  Administered 2011-10-24: 4 mg via INTRAVENOUS
  Filled 2011-10-17: qty 2

## 2011-10-17 MED ORDER — SODIUM CHLORIDE 0.9 % IV SOLN: 250.0000 mL | INTRAVENOUS | Status: DC | PRN

## 2011-10-17 MED ORDER — WARFARIN SODIUM 5 MG PO TABS
5.0000 mg | ORAL_TABLET | Freq: Once | ORAL | Status: AC
  Administered 2011-10-17: 5 mg via ORAL
  Filled 2011-10-17: qty 1

## 2011-10-17 NOTE — ED Provider Notes (Signed)
History     CSN: [id number]  Arrival date & time 10/17/11  1401   First MD Initiated Contact with Patient 10/17/11 1500      Chief Complaint  Patient presents with  . Wound Infection    (Consider location/radiation/quality/duration/timing/severity/associated sxs/prior treatment) The history is provided by the patient and a relative.   78 year old female has been having problems with ulcers on both lower legs for the last 2 months. The left leg is worse than the right leg. She's been followed at the wound management clinic in [place] but the ulcers have been getting progressively worse in spite of wound care management. She has not run fevers or had chills or sweats. There is severe pain which she rated 10 over 10. Percocet does give temporary relief of pain but nothing else helps. There has been drainage from the ulcers. Originally, there was some concern about a possible brown recluse spider bite. She had been hospitalized shortly after the wounds first became obvious, but that was for hyperkalemia. She has been on very his antibiotics so with no benefit.  Past Medical History  Diagnosis Date  . Coronary artery disease     STATUS POST OLD INFERIOR WALL M.I.  . Diabetes mellitus   . Chronic anticoagulation   . Atrial fibrillation   . MI, old 2008    Past Surgical History  Procedure Date  . Appendectomy   . Total hip arthroplasty     RIGHT HIP  . Abdominal hysterectomy     1976    Family History  Problem Relation Age of Onset  . Cancer Father 8    History  Substance Use Topics  . Smoking status: Never Smoker   . Smokeless tobacco: Not on file  . Alcohol Use: No    OB History    Grav Para Term Preterm Abortions TAB SAB Ect Mult Living                  Review of Systems  All other systems reviewed and are negative.    Allergies  Adhesive; Sulfa antibiotics; and Penicillins  Home Medications   Current Outpatient Rx  Name Route Sig Dispense Refill  .  ACETAMINOPHEN 325 MG PO TABS Oral Take 325-650 mg by mouth daily as needed. For leg pain    . ASPIRIN 81 MG PO TABS Oral Take 81 mg by mouth every morning.     Marland Kitchen BRIMONIDINE TARTRATE-TIMOLOL 0.2-0.5 % OP SOLN Both Eyes Place 1 drop into both eyes 2 (two) times daily.    Marland Kitchen CARVEDILOL 12.5 MG PO TABS Oral Take 12.5 mg by mouth 2 (two) times daily with a meal.      . VITAMIN D3 400 UNITS PO CAPS Oral Take by mouth every morning.     Marland Kitchen HYDROCODONE-ACETAMINOPHEN 5-325 MG PO TABS Oral Take 1-2 tablets by mouth every 6 (six) hours as needed. For pain.    Marland Kitchen LOSARTAN POTASSIUM 100 MG PO TABS Oral Take 25 mg by mouth daily.     Marland Kitchen MAGNESIUM OXIDE 400 MG PO TABS Oral Take 400 mg by mouth 2 (two) times daily.      Marland Kitchen METFORMIN HCL 500 MG PO TABS Oral Take 1,000-1,500 mg by mouth daily with supper.     Marland Kitchen FISH OIL 1000 MG PO CAPS Oral Take 1 capsule by mouth every morning.     . RED YEAST RICE PO Oral Take 1.2 g by mouth at bedtime.    . WARFARIN SODIUM 5  MG PO TABS Oral Take 5 mg by mouth every evening.       BP 148/56  Pulse 67  Temp 98.3 F (36.8 C) (Oral)  Resp 18  SpO2 100%  Physical Exam  Nursing note and vitals reviewed.  78 year old female is resting comfortably and in no acute distress her vital signs are significant for systolic hypertension with blood pressure 140/56. Oxygen saturation is 100% which is normal. Head is normocephalic and atraumatic. PERRLA, EOMI. Oropharynx is clear. Neck is nontender and supple. Back is nontender. Lungs are clear without rales, wheezes, rhonchi. Heart is irregularly irregular with 2/6 holosystolic murmur present along left sternal border. Abdomen is soft, flat, nontender without masses or hepatosplenomegaly. Extremities: Ulcerations are present over the posterior aspects of both lower legs with the left being worse than the right. There is some purulent drainage. There is a rather intense erythema of the calves bilaterally. Dorsalis pedis pulses are of 1+ and  symmetric. Capillary refill is delayed at 5-6 seconds and symmetric. There is 1+ edema present. Skin is warm and dry without other rash. Neurologic: Mental status is normal, cranial nerves are intact, there are no motor or sensory deficits.  ED Course  Procedures (including critical care time)  Results for orders placed during the hospital encounter of 10/17/11  CBC WITH DIFFERENTIAL      Component Value Range   WBC 12.7 (*) 4.0 - 10.5 K/uL   RBC 4.05  3.87 - 5.11 MIL/uL   Hemoglobin 12.6  12.0 - 15.0 g/dL   HCT 11.9  14.7 - 82.9 %   MCV 93.3  78.0 - 100.0 fL   MCH 31.1  26.0 - 34.0 pg   MCHC 33.3  30.0 - 36.0 g/dL   RDW 56.2  13.0 - 86.5 %   Platelets 228  150 - 400 K/uL   Neutrophils Relative 77  43 - 77 %   Neutro Abs 9.8 (*) 1.7 - 7.7 K/uL   Lymphocytes Relative 10 (*) 12 - 46 %   Lymphs Abs 1.2  0.7 - 4.0 K/uL   Monocytes Relative 13 (*) 3 - 12 %   Monocytes Absolute 1.6 (*) 0.1 - 1.0 K/uL   Eosinophils Relative 1  0 - 5 %   Eosinophils Absolute 0.1  0.0 - 0.7 K/uL   Basophils Relative 0  0 - 1 %   Basophils Absolute 0.0  0.0 - 0.1 K/uL  COMPREHENSIVE METABOLIC PANEL      Component Value Range   Sodium 136  135 - 145 mEq/L   Potassium 5.0  3.5 - 5.1 mEq/L   Chloride 97  96 - 112 mEq/L   CO2 26  19 - 32 mEq/L   Glucose, Bld 228 (*) 70 - 99 mg/dL   BUN 34 (*) 6 - 23 mg/dL   Creatinine, Ser 7.84 (*) 0.50 - 1.10 mg/dL   Calcium 9.7  8.4 - 69.6 mg/dL   Total Protein 8.2  6.0 - 8.3 g/dL   Albumin 3.6  3.5 - 5.2 g/dL   AST 18  0 - 37 U/L   ALT 9  0 - 35 U/L   Alkaline Phosphatase 79  39 - 117 U/L   Total Bilirubin 0.9  0.3 - 1.2 mg/dL   GFR calc non Af Amer 40 (*) >90 mL/min   GFR calc Af Amer 46 (*) >90 mL/min  SEDIMENTATION RATE      Component Value Range   Sed Rate 92 (*) 0 - 22  mm/hr  PROTIME-INR      Component Value Range   Prothrombin Time 25.0 (*) 11.6 - 15.2 seconds   INR 2.22 (*) 0.00 - 1.49  URINALYSIS, ROUTINE W REFLEX MICROSCOPIC      Component Value  Range   Color, Urine YELLOW  YELLOW   APPearance CLEAR  CLEAR   Specific Gravity, Urine 1.023  1.005 - 1.030   pH 5.0  5.0 - 8.0   Glucose, UA 250 (*) NEGATIVE mg/dL   Hgb urine dipstick SMALL (*) NEGATIVE   Bilirubin Urine SMALL (*) NEGATIVE   Ketones, ur NEGATIVE  NEGATIVE mg/dL   Protein, ur 161 (*) NEGATIVE mg/dL   Urobilinogen, UA 0.2  0.0 - 1.0 mg/dL   Nitrite NEGATIVE  NEGATIVE   Leukocytes, UA NEGATIVE  NEGATIVE  URINE MICROSCOPIC-ADD ON      Component Value Range   Squamous Epithelial / LPF FEW (*) RARE   WBC, UA 0-2  <3 WBC/hpf   RBC / HPF 0-2  <3 RBC/hpf   Bacteria, UA MANY (*) RARE   Casts GRANULAR CAST (*) NEGATIVE     1. Skin ulcer of calf   2. Cellulitis of calf   3. Atrial fibrillation   4. Renal insufficiency       MDM  Progressive leg ulcers with evidence of cellulitis. There is also some degree of arterial insufficiency. I am wondering about possibility of warfarin-induced skin necrosis as being in the arch of her ulcers. She needs to be evaluated for arterial and venous insufficiency and also may need to consider switching off of all of warfarin. She will be started on vancomycin for antibiotic coverage. Her prior records were reviewed and she was admitted for severe hyperkalemia and renal insufficiency 2 months ago. At that time, there was also concern for arterial and venous insufficiency.  Laboratory work is significant for elevated sedimentation rate and slightly elevated WBC. Case is discussed with Dr. Irene Limbo of [facility] who agrees to admit the patient.  Dione Booze, MD 10/17/11 1836

## 2011-10-17 NOTE — ED Notes (Signed)
Pt reports wound to back of L calf increasingly worse for past weeks. Has been to PCP but wound will not heal so they recommended pt come to ed for eval . Pt c/o drainage, redness and pain at site

## 2011-10-17 NOTE — H&P (Signed)
History and Physical  Kathryn Schroeder:[id number] DOB: 1931/03/21 DOA: 10/17/2011  Referring physician: Marica Otter, MD PCP: Romeo Rabon, MD   Chief Complaint: Leg pain  HPI:  78 year old woman presents to the emergency department with bilateral lower leg pain and ulcers. Symptoms began to half months ago when she developed a small ulcer on the back of her left calf. This was thought to be a spider bite and was treated with antibiotic. She was hospitalized shortly thereafter with severe hyperkalemia thought to be secondary to this antibiotic (Septra?). She reports subsequently she has been on to other antibiotics which she cannot recall. Her primary care physician referred her to the wound care center where she has been receiving treatment for the last 2 and half months.  Despite wound care treatment her left leg continued to worsen in regard to ulceration and pain. Approximately 2 weeks ago she developed a similar ulcer on her right leg and increased pain in this leg as well. She has had intermittent swelling over the last 2 and half months and intermittent erythema of the lower legs. She was recently placed in boots and referred to infectious disease physician who subsequently recommended she seek out hospital care. She denies any injury and no previous history of leg problems. The pain in her leg is constant and severe in nature and is worse with ambulation.  In the emergency department she was noted to be afebrile with vital signs being stable. Chemistry panel was unremarkable. CBC was notable for leukocytosis.  Chart Review:  Hospital admission 08/2011: Admitted for hyperkalemia, acute renal failure, left lower leg wound. Wound care was recommended.  Review of Systems:  Negative for fever, changes to her vision, sore throat, rash, muscle aches except as above, chest pain, shortness of breath, dysuria, bleeding, nausea, vomiting.  Past Medical History  Diagnosis Date  . Coronary  artery disease     STATUS POST OLD INFERIOR WALL M.I.  . Diabetes mellitus   . Chronic anticoagulation   . Atrial fibrillation   . MI, old 2008   Past Surgical History  Procedure Date  . Appendectomy   . Total hip arthroplasty     RIGHT HIP  . Abdominal hysterectomy     1976   Social History:  reports that she has never smoked. She does not have any smokeless tobacco history on file. She reports that she does not drink alcohol or use illicit drugs.  Allergies  Allergen Reactions  . Bactrim (Sulfamethoxazole W-Trimethoprim) Hives  . Adhesive (Tape)     WHITE tape: Causes blisters and skin reaction  . Sulfa Antibiotics   . Penicillins Itching and Rash    Childhood allergy:    Family History  Problem Relation Age of Onset  . Cancer Father 64   Prior to Admission medications   Medication Sig Start Date End Date Taking? Authorizing Provider  acetaminophen (TYLENOL) 325 MG tablet Take 325-650 mg by mouth daily as needed. For leg pain   Yes Historical Provider, MD  aspirin 81 MG tablet Take 81 mg by mouth every morning.    Yes Historical Provider, MD  brimonidine-timolol (COMBIGAN) 0.2-0.5 % ophthalmic solution Place 1 drop into both eyes 2 (two) times daily.   Yes Historical Provider, MD  carvedilol (COREG) 12.5 MG tablet Take 12.5 mg by mouth 2 (two) times daily with a meal.     Yes Historical Provider, MD  Cholecalciferol (VITAMIN D3) 400 UNITS CAPS Take by mouth every morning.    Yes Historical  Provider, MD  HYDROcodone-acetaminophen (NORCO) 5-325 MG per tablet Take 1-2 tablets by mouth every 6 (six) hours as needed. For pain.   Yes Historical Provider, MD  losartan (COZAAR) 100 MG tablet Take 25 mg by mouth daily.    Yes Historical Provider, MD  magnesium oxide (MAG-OX) 400 MG tablet Take 400 mg by mouth 2 (two) times daily.     Yes Historical Provider, MD  metFORMIN (GLUCOPHAGE) 500 MG tablet Take 1,000-1,500 mg by mouth daily with supper.    Yes Historical Provider, MD    Omega-3 Fatty Acids (FISH OIL) 1000 MG CAPS Take 1 capsule by mouth every morning.    Yes Historical Provider, MD  Red Yeast Rice Extract (RED YEAST RICE PO) Take 1.2 g by mouth at bedtime.   Yes Historical Provider, MD  warfarin (COUMADIN) 5 MG tablet Take 5 mg by mouth every evening.    Yes Historical Provider, MD   Physical Exam: Filed Vitals:   10/17/11 1409 10/17/11 1729  BP: 148/56 136/60  Pulse: 67 70  Temp: 98.3 F (36.8 C)   TempSrc: Oral   Resp: 18 16  SpO2: 100% 98%    General:  Appears calm and comfortable. Examined in the emergency department.  Eyes: Pupils equal, round, reactive to light. Normal lids, irises, conjunctiva.  ENT: Hearing grossly normal. Lips and tongue unremarkable.  Neck: No lymphadenopathy or masses. No thyromegaly.  Cardiovascular: Regular rate and rhythm. 2/6 systolic murmur. No rub or gallop.  Respiratory: Clear auscultation bilaterally. No wheezes, rales, rhonchi. Normal respiratory effort.  Abdomen: Soft, nontender, nondistended.  Skin: Ulcerations are noted the bilateral lower extremities. Most significantly noted left calf but also noted right lower leg. There are areas of weeping and this serous fluid leakage. She is mild symmetric erythema of her lower legs near the ankles which does not involve the feet. She is weak dorsalis pedis pulses. Capillary refill in the digits is 2-3 seconds. Sensation is grossly intact.  Musculoskeletal: Grossly normal tone and strength.  Psychiatric: Grossly normal and affect. Speech fluent and appropriate.  Labs on Admission:  Basic Metabolic Panel:  Lab 10/17/11 5784  NA 136  K 5.0  CL 97  CO2 26  GLUCOSE 228*  BUN 34*  CREATININE 1.25*  CALCIUM 9.7  MG --  PHOS --   Liver Function Tests:  Lab 10/17/11 1624  AST 18  ALT 9  ALKPHOS 79  BILITOT 0.9  PROT 8.2  ALBUMIN 3.6   CBC:  Lab 10/17/11 1624  WBC 12.7*  NEUTROABS 9.8*  HGB 12.6  HCT 37.8  MCV 93.3  PLT 228   Principal  Problem:  *Leg pain Active Problems:  Chronic anticoagulation  Atrial fibrillation  Diabetes mellitus, type 2  Cellulitis  Bilateral leg ulcer   Assessment/Plan 1. Bilateral lower extremity ulceration, pain: History and failure to improve most suggestive of arterial insufficiency. Erythema may represent secondary infection. Empiric antibiotics. Vascular studies. Note the patient has been on warfarin for months and there is no reason to suspect warfarin-induced skin necrosis as this is seen in the first few days after warfarin administration. Wound care. I think there is little reason to suspect MRSA at this point based on history and examination.  2. Possible superimposed cellulitis lower extremities: Plan as above. 3. Borderline hyperkalemia: Hold losartan. Repeat basic metabolic panel morning. 4. Diabetes mellitus: Appears stable. Continue. Hold warfarin while hospitalized. Sliding-scale insulin. 5. Atrial fibrillation: Stable. Continue Coreg and warfarin.  Code Status: Full code Family Communication:  Discussed with son and family at bedside. Disposition Plan: Pending further evaluation and treatment.  Brendia Sacks, MD  [facility] Pager [phone] If 8PM-8AM, please contact floor/night-coverage at [email or web address], password [facility] 10/17/2011, 6:21 PM

## 2011-10-17 NOTE — Progress Notes (Signed)
ANTICOAGULATION & ANTIBIOTIC CONSULT NOTE - Initial Consult  Pharmacy Consult for Levaquin, Coumadin Indication: cellulitis, atrial fibrillation  Allergies  Allergen Reactions  . Bactrim (Sulfamethoxazole W-Trimethoprim) Hives  . Adhesive (Tape)     WHITE tape: Causes blisters and skin reaction  . Sulfa Antibiotics   . Penicillins Itching and Rash    Childhood allergy:     Patient Measurements:     Vital Signs: Temp: 98.3 F (36.8 C) (07/11 1409) Temp src: Oral (07/11 1409) BP: 136/60 mmHg (07/11 1729) Pulse Rate: 70  (07/11 1729)  Labs:  Basename 10/17/11 1624  HGB 12.6  HCT 37.8  PLT 228  APTT --  LABPROT 25.0*  INR 2.22*  HEPARINUNFRC --  CREATININE 1.25*  CKTOTAL --  CKMB --  TROPONINI --    The CrCl is unknown because both a height and weight (above a minimum accepted value) are required for this calculation.   Medical History: Past Medical History  Diagnosis Date  . Coronary artery disease     STATUS POST OLD INFERIOR WALL M.I.  . Diabetes mellitus   . Chronic anticoagulation   . Atrial fibrillation   . MI, old 2008    Assessment: 78 yo F admitted with bilateral lower leg pain and ulcers.  Likely arterial insufficiency, also with erythema and to be treated for cellulitis.  Has received vancomycin 1 gm IV at 1619 in ED, to begin empiric Levaquin on admission.  Patient also on chronic anticoagulation for afib with Coumadin, which is to be continued.  Patient's INR is currently therapeutic at 2.22.  WBC slightly elevated at 12.7, SCr 1.25 (estimated crcl ~58 ml/min).  Patient has not had her Coumadin tonight.  Goal of Therapy:  INR 2-3 Monitor platelets by anticoagulation protocol: Yes Resolution of infection  Plan:  1.  Coumadin 5 mg po x 1 tonight 2.  Daily PT/INR 3.  Levaquin 750 mg IV q24hr (treating as complicated) 4.  F/up renal function, any cultures, clinical progress and change of Levaquin to po when able   Rolland Porter, Pharm.D.,  BCPS Clinical Pharmacist Pager: [phone] 10/17/2011,8:23 PM

## 2011-10-17 NOTE — ED Notes (Signed)
Patient bilateral lower legs are reddened from ankles to feet.  The left lower posterior leg is ulcerated. Pt states has been growing the past 2.5 months. States sometimes it feels like "my legs are on fire" . The right posteriolateral lower leg above ankle has breakdown beginning. Pt is active at home. She continues to be ambulatory. She has been going to wound care for this but is not improving.

## 2011-10-18 DIAGNOSIS — L97209 Non-pressure chronic ulcer of unspecified calf with unspecified severity: Secondary | ICD-10-CM

## 2011-10-18 DIAGNOSIS — N179 Acute kidney failure, unspecified: Secondary | ICD-10-CM

## 2011-10-18 DIAGNOSIS — L97909 Non-pressure chronic ulcer of unspecified part of unspecified lower leg with unspecified severity: Secondary | ICD-10-CM

## 2011-10-18 DIAGNOSIS — M79609 Pain in unspecified limb: Secondary | ICD-10-CM

## 2011-10-18 LAB — HEMOGLOBIN A1C
Hgb A1c MFr Bld: 7.5 % — ABNORMAL HIGH (ref <5.7)
Mean Plasma Glucose: 169 mg/dL — ABNORMAL HIGH (ref <117)

## 2011-10-18 LAB — CBC
Hemoglobin: 11.7 g/dL — ABNORMAL LOW (ref 12.0–15.0)
Platelets: 205 10*3/uL (ref 150–400)
RBC: 3.77 MIL/uL — ABNORMAL LOW (ref 3.87–5.11)
WBC: 11.6 10*3/uL — ABNORMAL HIGH (ref 4.0–10.5)

## 2011-10-18 LAB — BASIC METABOLIC PANEL
Calcium: 9.3 mg/dL (ref 8.4–10.5)
GFR calc non Af Amer: 37 mL/min — ABNORMAL LOW (ref >90)
Potassium: 4.5 mEq/L (ref 3.5–5.1)
Sodium: 134 mEq/L — ABNORMAL LOW (ref 135–145)

## 2011-10-18 LAB — GLUCOSE, CAPILLARY
Glucose-Capillary: 172 mg/dL — ABNORMAL HIGH (ref 70–99)
Glucose-Capillary: 185 mg/dL — ABNORMAL HIGH (ref 70–99)

## 2011-10-18 LAB — PROTIME-INR: INR: 2.2 — ABNORMAL HIGH (ref 0.00–1.49)

## 2011-10-18 MED ORDER — SODIUM CHLORIDE 0.9 % IV SOLN
INTRAVENOUS | Status: DC
  Administered 2011-10-18 – 2011-10-21 (×3): via INTRAVENOUS

## 2011-10-18 MED ORDER — CEFEPIME HCL 2 G IJ SOLR
2.0000 g | INTRAMUSCULAR | Status: DC
  Administered 2011-10-18 – 2011-10-20 (×3): 2 g via INTRAVENOUS
  Filled 2011-10-18 (×4): qty 2

## 2011-10-18 MED ORDER — VANCOMYCIN HCL IN DEXTROSE 1-5 GM/200ML-% IV SOLN
1000.0000 mg | INTRAVENOUS | Status: DC
  Administered 2011-10-18 – 2011-10-20 (×3): 1000 mg via INTRAVENOUS
  Filled 2011-10-18 (×4): qty 200

## 2011-10-18 MED ORDER — LEVOFLOXACIN IN D5W 750 MG/150ML IV SOLN: 750.0000 mg | INTRAVENOUS | Status: DC

## 2011-10-18 MED ORDER — OXYCODONE-ACETAMINOPHEN 5-325 MG PO TABS
1.0000 | ORAL_TABLET | ORAL | Status: DC | PRN
Start: 2011-10-18 — End: 2011-10-21
  Administered 2011-10-18 (×3): 2 via ORAL
  Administered 2011-10-19: 1 via ORAL
  Administered 2011-10-19: 2 via ORAL
  Administered 2011-10-19: 1 via ORAL
  Administered 2011-10-20 – 2011-10-21 (×5): 2 via ORAL
  Filled 2011-10-18 (×4): qty 2
  Filled 2011-10-18: qty 1
  Filled 2011-10-18: qty 2
  Filled 2011-10-18: qty 1
  Filled 2011-10-18 (×5): qty 2

## 2011-10-18 MED ORDER — SODIUM CHLORIDE 0.9 % IV SOLN: 250.0000 mL | INTRAVENOUS | Status: DC | PRN

## 2011-10-18 MED ORDER — WARFARIN SODIUM 5 MG PO TABS
5.0000 mg | ORAL_TABLET | Freq: Once | ORAL | Status: AC
  Administered 2011-10-18: 5 mg via ORAL
  Filled 2011-10-18: qty 1

## 2011-10-18 MED ORDER — HYDROMORPHONE HCL PF 1 MG/ML IJ SOLN
1.0000 mg | Freq: Once | INTRAMUSCULAR | Status: AC
  Administered 2011-10-18: 1 mg via INTRAVENOUS

## 2011-10-18 MED ORDER — DEXTROSE 5 % IV SOLN: 1.0000 g | Freq: Two times a day (BID) | INTRAVENOUS | Status: DC

## 2011-10-18 MED ORDER — OXYCODONE-ACETAMINOPHEN 5-325 MG PO TABS: 2.0000 | ORAL_TABLET | ORAL | Status: DC | PRN

## 2011-10-18 NOTE — Consult Note (Signed)
WOC consult Note Reason for Consult: Consult requested for bilat leg wounds.  Pt has had stasis ulcers for several months, which were being treated by the outpatient wound care center in [place], [place].  These became more painful, blistered and leaking during the past week.  Pt has been using santyl prior to admission.  ABI has been ordered and results are pending. If ABI is abnormal, then pt will need a vascular service consult. Legs have previously been marked for erythema and edema and appear to be receeding since antibiotics were started. Wound type: Multiple areas of full thickness wounds to left and right legs. Measurement:  Right leg with generalized edema, erythremia, blistered areas which have begun to peel.  Full thickness wound 5X5 to anterior calf.  50% red, 50% yellow, mod yellow drainage, some slight odor, painful to touch. Left leg with more extensive areas of wounds, difficult to measure accurately R/T locations and discomfort..   Anterior wound approx 7X5 cm 50% red, 50% yellow,large amt yellow drainage, strong odor, painful to touch.  Posterior leg 10X7cm with same appearance. Dressing procedure/placement/frequency: Hydrogel with silver to provide antimicrobial benefits and assist with removal of nonviable tissue.  Foam silicone dressing to decrease discomfort with dressing changes and absorb drainage from legs.  Legs are elevated on pillows.  Cammie Mcgee, RN, MSN, [employer]  [phone]

## 2011-10-18 NOTE — Progress Notes (Signed)
Pt alert and oriented x4.  Complaints of pain in BLE.  Dressings present to BLE.  Oriented pt to surroundings and situation.  Instructed pt to use call bell for assistance.

## 2011-10-18 NOTE — Care Management Note (Unsigned)
    Page 1 of 2   10/22/2011     9:43:32 AM   CARE MANAGEMENT NOTE 10/22/2011  Patient:  Kathryn Schroeder, Kathryn Schroeder   Account Number:  [id number]  Date Initiated:  10/18/2011  Documentation initiated by:  Letha Cape  Subjective/Objective Assessment:   dx cellulitis  admit- lives alone.  pta independent. Patint was driving pta .     Action/Plan:   pt eval   Anticipated DC Date:  10/22/2011   Anticipated DC Plan:  HOME W HOME HEALTH SERVICES      DC Planning Services  CM consult      [facility] Choice  HOME HEALTH   Choice offered to / List presented to:  C-1 Patient        HH arranged  HH-1 RN  HH-2 PT  HH-3 OT  HH-4 NURSE'S AIDE      HH agency  [facility]   Status of service:  In process, will continue to follow Medicare Important Message given?   (If response is "NO", the following Medicare IM given date fields will be blank) Date Medicare IM given:   Date Additional Medicare IM given:    Discharge Disposition:    Per UR Regulation:  Reviewed for med. necessity/level of care/duration of stay  If discussed at Long Length of Stay Meetings, dates discussed:    Comments:  10/22/11 8:28 Letha Cape RN, BSN [phone] information faxed to [facility] for referral at [phone].  NCM will continue to follow for dc needs.  Received call from [place] [facility], she states they do not have occupational services, I informed her that is ok as long as she can get physical therapy.  She also wanted to know when patient will be dc , I informed her that [facility] NCM will call her to let her know (her phone is [phone] xt [phone]. ) [facility] will also need to know if they shoult continue dressing changes to patient's legs.  10/21/11 17:03 Letha Cape RN, BSN [phone] Patent would like to continue with [facility] health agency, referral made to [facility] for [facility], HHPT, OT, and aide.  Their fax number is [phone] and the office phone is [phone] ext [phone].  Soc will begin 24-48 hrs post discharge.  NCM will continue to follow for dc needs.  10/18/11 14:58 Letha Cape RN, BSN [phone] patient lives alone.  Await pt eval.   Pateint does not have medicare part D, but she has transportation. NCM will continue to follow for dc needs.  Patient states she can afford her medications which she gets from [employer]. Patient states she has home health services to come by and dress her leg but she is not sure what agency they are from , she will have her daughter check into this.  Patient's daughter gave me the phone number to [facility] Department [phone] xt [phone], [facility] with [facility]) came out to do dressing change.  Will check with [facility] Dept on Monday.

## 2011-10-18 NOTE — Progress Notes (Signed)
[facility] PROGRESS NOTE  Kathryn Schroeder ZOX:[id number] DOB: 05/01/1930 DOA: 10/17/2011   Assessment/Plan: Leg pain (10/17/2011)/Cellulitis (10/17/2011)/Bilateral leg ulcer (10/17/2011) -patient relates her bilateral leg pain is much improved, she has remained afebrile white count is trending down. She is on vancomycin and Unasyn. -We'll go ahead and get wound cultures, ABIs and venous Doppler. -check wound cultures. -keep legs elevated above heart level.  Chronic anticoagulation () -continue coumadin, INR daily.  Atrial fibrillation () Rate controlled. INR therapeutic.  Diabetes mellitus, type 2 (10/17/2011) -continues to improved, continue to hold metformin.  Family Communication: patient and son Disposition Plan: inpatient   LOS: 1 day   Procedures:  Doppler/ABI  Antibiotics:  Vanc/unasyn 10/18/2011   Subjective: Leg pain improved.  Objective: Filed Vitals:   10/17/11 1729 10/17/11 2046 10/18/11 0518 10/18/11 0749  BP: 136/60 146/82 144/79   Pulse: 70 83 75   Temp:  97.9 F (36.6 C) 98.2 F (36.8 C)   TempSrc:  Oral Oral   Resp: 16 18 18    Height:    5\' 8"  (1.727 m)  Weight:    97.5 kg (214 lb 15.2 oz)  SpO2: 98% 97% 96%     Intake/Output Summary (Last 24 hours) at 10/18/11 1048 Last data filed at 10/18/11 0900  Gross per 24 hour  Intake    330 ml  Output      0 ml  Net    330 ml   Weight change:   Exam:  General: Alert, awake, oriented x3, in no acute distress.  HEENT: No bruits, no goiter.  Heart: Regular rate and rhythm, without murmurs, rubs, gallops.  Lungs: Good air movement, bilateral air movement.  Extremities: b/l leg swelling, erythematous and tender to touch.   Data Reviewed: Basic Metabolic Panel:  Lab 10/18/11 8119 10/17/11 1624  NA 134* 136  K 4.5 5.0  CL 99 97  CO2 26 26  GLUCOSE 174* 228*  BUN 35* 34*  CREATININE 1.34* 1.25*  CALCIUM 9.3 9.7  MG -- --  PHOS -- --   Liver Function Tests:  Lab 10/17/11  1624  AST 18  ALT 9  ALKPHOS 79  BILITOT 0.9  PROT 8.2  ALBUMIN 3.6   No results found for this basename: LIPASE:5,AMYLASE:5 in the last 168 hours No results found for this basename: AMMONIA:5 in the last 168 hours CBC:  Lab 10/18/11 0535 10/17/11 1624  WBC 11.6* 12.7*  NEUTROABS -- 9.8*  HGB 11.7* 12.6  HCT 35.5* 37.8  MCV 94.2 93.3  PLT 205 228   Cardiac Enzymes: No results found for this basename: CKTOTAL:5,CKMB:5,CKMBINDEX:5,TROPONINI:5 in the last 168 hours BNP: No components found with this basename: POCBNP:5 CBG:  Lab 10/18/11 0744 10/17/11 2050  GLUCAP 172* 250*    No results found for this or any previous visit (from the past 240 hour(s)).   Studies: No results found.  Scheduled Meds:   . aspirin  81 mg Oral Daily  . brimonidine  1 drop Both Eyes BID   And  . timolol  1 drop Both Eyes BID  . carvedilol  12.5 mg Oral BID WC  . ceFEPime (MAXIPIME) IV  2 g Intravenous Q24H  . HYDROmorphone  1 mg Intravenous Once  .  HYDROmorphone (DILAUDID) injection  1 mg Intravenous Once  . insulin aspart  0-9 Units Subcutaneous TID WC  . magnesium oxide  400 mg Oral BID  . ondansetron (ZOFRAN) IV  4 mg Intravenous Once  . sodium chloride  3  mL Intravenous Q12H  . vancomycin  1,000 mg Intravenous Once  . vancomycin  1,000 mg Intravenous Q24H  . warfarin  5 mg Oral Once  . warfarin  5 mg Oral ONCE-1800  . Warfarin - Pharmacist Dosing Inpatient   Does not apply q1800  . DISCONTD: aspirin  81 mg Oral BH-q7a  . DISCONTD: brimonidine-timolol  1 drop Both Eyes BID  . DISCONTD: ceFEPime (MAXIPIME) IV  1 g Intravenous Q12H  . DISCONTD: levofloxacin (LEVAQUIN) IV  750 mg Intravenous Q24H  . DISCONTD: levofloxacin (LEVAQUIN) IV  750 mg Intravenous Q48H  . DISCONTD: magnesium oxide  400 mg Oral BID  . DISCONTD: vancomycin  1,000 mg Intravenous Once   Continuous Infusions:   . sodium chloride 75 mL/hr at 10/18/11 1011    Lambert Keto, MD  [facility] Pager [phone]  If 7PM-7AM, please contact night-coverage [email or web address] Password [facility] 10/18/2011, 10:48 AM

## 2011-10-18 NOTE — Progress Notes (Signed)
Clinical Social Worker received referral for pt needing Medication Assistance. Inappropriate Clinical Social Worker referral. RNCM made aware of referral. Clinical Social Worker signing off. Please re-consult if further social work needs arise.  Jacklynn Lewis, MSW, LCSWA  Clinical Social Work [phone]

## 2011-10-18 NOTE — Progress Notes (Addendum)
ANTICOAGULATION & ANTIBIOTIC CONSULT NOTE - Follow Up Consult  Pharmacy Consult for Warfarin + Vancomycin Indication: Hx Afib and empiric coverage for B/L LE cellulitis  Allergies  Allergen Reactions  . Bactrim (Sulfamethoxazole W-Trimethoprim) Hives  . Adhesive (Tape)     WHITE tape: Causes blisters and skin reaction  . Sulfa Antibiotics   . Penicillins Itching and Rash    Childhood allergy:     Patient Measurements: Height: 5\' 8"  (172.7 cm) Weight: 214 lb 15.2 oz (97.5 kg) IBW/kg (Calculated) : 63.9   Vital Signs: Temp: 98.2 F (36.8 C) (07/12 0518) Temp src: Oral (07/12 0518) BP: 144/79 mmHg (07/12 0518) Pulse Rate: 75  (07/12 0518)  Labs:  Basename 10/18/11 0535 10/17/11 1624  HGB 11.7* 12.6  HCT 35.5* 37.8  PLT 205 228  APTT -- --  LABPROT 24.8* 25.0*  INR 2.20* 2.22*  HEPARINUNFRC -- --  CREATININE 1.34* 1.25*  CKTOTAL -- --  CKMB -- --  TROPONINI -- --    Estimated Creatinine Clearance: 41.5 ml/min (by C-G formula based on Cr of 1.34).   Medications:  Anti-infectives     Start     Dose/Rate Route Frequency Ordered Stop   10/17/11 2200   levofloxacin (LEVAQUIN) IVPB 750 mg        750 mg 100 mL/hr over 90 Minutes Intravenous Every 24 hours 10/17/11 2057     10/17/11 1545   vancomycin (VANCOCIN) IVPB 1000 mg/200 mL premix        1,000 mg 200 mL/hr over 60 Minutes Intravenous  Once 10/17/11 1534 10/17/11 1749   10/17/11 1530   vancomycin (VANCOCIN) injection 1,000 mg  Status:  Discontinued        1,000 mg Intravenous  Once 10/17/11 1517 10/17/11 1533          Assessment: 78 y.o. F on warfarin resumed from PTA for hx Afib with a therapeutic INR this a.m (INR 2.2 << 2.22, goal of 2-3). Hgb/Hct/Plt slight drop, no s/sx of bleeding noted.   The patient is also to resume Vancomycin per Rx  + Cefepime per MD for empiric coverage for potential B/L LE cellulitis The patient received 1 dose of Vancomycin 1g at 1600 in the ED on 7/11. Afebrile, WBC 11.6 <<  12.7, SCr up to 1.34, estimated CrCl~40-45 ml/min. Given bumps in SCr will dose conservatively and adjust dose as renal function stabilizes.    Goal of Therapy:  INR 2-3   Plan:  1. Warfarin 5 mg x 1 dose at 1800 today 2. Vancomycin 1g IV every 24 hours 3. Will continue to monitor for any signs/symptoms of bleeding and will follow up with PT/INR in the a.m.  4. Will continue to follow renal function, culture results, LOT, and antibiotic de-escalation plans   Georgina Pillion, PharmD, BCPS Clinical Pharmacist Pager: [phone] 10/18/2011 8:28 AM

## 2011-10-19 ENCOUNTER — Inpatient Hospital Stay (HOSPITAL_COMMUNITY): Payer: Medicare Other

## 2011-10-19 DIAGNOSIS — M79609 Pain in unspecified limb: Secondary | ICD-10-CM

## 2011-10-19 DIAGNOSIS — I739 Peripheral vascular disease, unspecified: Secondary | ICD-10-CM

## 2011-10-19 LAB — GLUCOSE, CAPILLARY
Glucose-Capillary: 191 mg/dL — ABNORMAL HIGH (ref 70–99)
Glucose-Capillary: 212 mg/dL — ABNORMAL HIGH (ref 70–99)

## 2011-10-19 LAB — URINE CULTURE

## 2011-10-19 MED ORDER — WARFARIN SODIUM 2.5 MG PO TABS
2.5000 mg | ORAL_TABLET | Freq: Once | ORAL | Status: AC
  Administered 2011-10-19: 2.5 mg via ORAL
  Filled 2011-10-19: qty 1

## 2011-10-19 MED ORDER — INSULIN GLARGINE 100 UNIT/ML ~~LOC~~ SOLN
10.0000 [IU] | Freq: Every day | SUBCUTANEOUS | Status: DC
  Administered 2011-10-19 – 2011-10-23 (×5): 10 [IU] via SUBCUTANEOUS

## 2011-10-19 NOTE — Progress Notes (Signed)
[facility] PROGRESS NOTE  Kathryn Schroeder ZOX:[id number] DOB: 15-Apr-1930 DOA: 10/17/2011   Assessment/Plan: Leg pain (10/17/2011)/Cellulitis (10/17/2011)/Bilateral leg ulcer (10/17/2011) -patient relates her bilateral leg pain is much improved, she has remained afebrile white count is trending down. She is on vancomycin and Unasyn. Wound care following. -pending wound cultures, ABIs and venous Doppler. -keep legs elevated above heart level.  Chronic anticoagulation () -continue coumadin, INR daily.  Atrial fibrillation () Rate controlled. INR supra-therapeutic.  Diabetes mellitus, type 2 (10/17/2011) -continues to improved, continue to hold metformin.  Family Communication: patient and son Disposition Plan: inpatient   LOS: 2 days   Procedures:  Doppler/ABI  Antibiotics:  Vanc/unasyn 10/18/2011   Subjective: Leg pain improved.  Objective: Filed Vitals:   10/18/11 1657 10/18/11 2118 10/19/11 0530 10/19/11 0654  BP: 145/81 126/75 141/83   Pulse: 84 75 96   Temp:  99 F (37.2 C) 100.4 F (38 C) 99.6 F (37.6 C)  TempSrc:  Oral Oral Oral  Resp:  20 18   Height:      Weight:      SpO2:  92% 95%     Intake/Output Summary (Last 24 hours) at 10/19/11 1142 Last data filed at 10/19/11 0500  Gross per 24 hour  Intake 1466.25 ml  Output      0 ml  Net 1466.25 ml   Weight change:   Exam:  General: Alert, awake, oriented x3, in no acute distress.  HEENT: No bruits, no goiter.  Heart: Regular rate and rhythm, without murmurs, rubs, gallops.  Lungs: Good air movement, bilateral air movement.  Extremities: b/l leg swelling, erythematous and tender to touch.   Data Reviewed: Basic Metabolic Panel:  Lab 10/18/11 8119 10/17/11 1624  NA 134* 136  K 4.5 5.0  CL 99 97  CO2 26 26  GLUCOSE 174* 228*  BUN 35* 34*  CREATININE 1.34* 1.25*  CALCIUM 9.3 9.7  MG -- --  PHOS -- --   Liver Function Tests:  Lab 10/17/11 1624  AST 18  ALT 9  ALKPHOS 79    BILITOT 0.9  PROT 8.2  ALBUMIN 3.6   No results found for this basename: LIPASE:5,AMYLASE:5 in the last 168 hours No results found for this basename: AMMONIA:5 in the last 168 hours CBC:  Lab 10/18/11 0535 10/17/11 1624  WBC 11.6* 12.7*  NEUTROABS -- 9.8*  HGB 11.7* 12.6  HCT 35.5* 37.8  MCV 94.2 93.3  PLT 205 228   Cardiac Enzymes: No results found for this basename: CKTOTAL:5,CKMB:5,CKMBINDEX:5,TROPONINI:5 in the last 168 hours BNP: No components found with this basename: POCBNP:5 CBG:  Lab 10/19/11 0805 10/18/11 2157 10/18/11 1657 10/18/11 1209 10/18/11 0744  GLUCAP 191* 176* 185* 180* 172*    Recent Results (from the past 240 hour(s))  URINE CULTURE     Status: Normal   Collection Time   10/17/11  3:32 PM      Component Value Range Status Comment   Specimen Description URINE, CLEAN CATCH   Final    Special Requests ADDED 10/17/11 1707   Final    Culture  Setup Time 10/17/2011 17:45   Final    Colony Count 15,000 COLONIES/ML   Final    Culture     Final    Value: Multiple bacterial morphotypes present, none predominant. Suggest appropriate recollection if clinically indicated.   Report Status 10/19/2011 FINAL   Final   WOUND CULTURE     Status: Normal (Preliminary result)   Collection Time  10/18/11 10:17 AM      Component Value Range Status Comment   Specimen Description WOUND LEFT LEG   Final    Special Requests Normal   Final    Gram Stain     Final    Value: NO WBC SEEN     NO SQUAMOUS EPITHELIAL CELLS SEEN     ABUNDANT GRAM POSITIVE COCCI IN PAIRS     ABUNDANT GRAM NEGATIVE COCCOBACILLI   Culture     Final    Value: ABUNDANT STAPHYLOCOCCUS AUREUS     Note: RIFAMPIN AND GENTAMICIN SHOULD NOT BE USED AS SINGLE DRUGS FOR TREATMENT OF STAPH INFECTIONS.     FEW GRAM NEGATIVE RODS   Report Status PENDING   Incomplete      Studies: No results found.  Scheduled Meds:    . aspirin  81 mg Oral Daily  . brimonidine  1 drop Both Eyes BID   And  . timolol   1 drop Both Eyes BID  . carvedilol  12.5 mg Oral BID WC  . ceFEPime (MAXIPIME) IV  2 g Intravenous Q24H  . insulin aspart  0-9 Units Subcutaneous TID WC  . magnesium oxide  400 mg Oral BID  . sodium chloride  3 mL Intravenous Q12H  . vancomycin  1,000 mg Intravenous Q24H  . warfarin  2.5 mg Oral ONCE-1800  . warfarin  5 mg Oral ONCE-1800  . Warfarin - Pharmacist Dosing Inpatient   Does not apply q1800   Continuous Infusions:    . sodium chloride 75 mL/hr at 10/18/11 1011    Lambert Keto, MD  [facility] Pager [phone]  If 7PM-7AM, please contact night-coverage [email or web address] Password [facility] 10/19/2011, 11:42 AM

## 2011-10-19 NOTE — Consult Note (Signed)
[facility] [place]  Patient name: Kathryn Schroeder MRN: [id number] DOB: [birth year] Sex: female  REASON FOR CONSULT: Bilateral venous stasis ulcers  HPI: Kathryn Schroeder is a 78 y.o. female who was admitted on 10/17/2011 with bilateral lower extremity pain and venous ulcers. She tells me that she developed ulcers initially on her left leg approximately 2 months ago. She subsequently developed ulcers on her right leg. These wounds have become progressively worse and she is admitted for intravenous antibiotics and wound care.  She has been going to a wound care center for wound care.  Vascular surgery was counseled concerning these venous ulcers.  She denies any previous history of DVT or phlebitis. She denies any history of claudication, rest pain, or previous nonhealing ulcers. She denies any recent fever or chills.  Past Medical History  Diagnosis Date  . Coronary artery disease     STATUS POST OLD INFERIOR WALL M.I.  . Diabetes mellitus   . Chronic anticoagulation   . Atrial fibrillation   . MI, old 2008    Family History  Problem Relation Age of Onset  . Cancer Father 44    SOCIAL HISTORY: History  Substance Use Topics  . Smoking status: Never Smoker   . Smokeless tobacco: Not on file  . Alcohol Use: No    Allergies  Allergen Reactions  . Bactrim (Sulfamethoxazole W-Trimethoprim) Hives  . Adhesive (Tape)     WHITE tape: Causes blisters and skin reaction  . Sulfa Antibiotics   . Penicillins Itching and Rash    Childhood allergy:     Current Facility-Administered Medications  Medication Dose Route Frequency Provider Last Rate Last Dose  . 0.9 %  sodium chloride infusion   Intravenous Continuous Marinda Elk, MD 75 mL/hr at 10/18/11 1011    . acetaminophen (TYLENOL) tablet 650 mg  650 mg Oral Q6H PRN Standley Brooking, MD       Or  . acetaminophen (TYLENOL) suppository 650 mg  650 mg Rectal Q6H PRN Standley Brooking, MD      .  alum & mag hydroxide-simeth (MAALOX/MYLANTA) 200-200-20 MG/5ML suspension 30 mL  30 mL Oral Q6H PRN Standley Brooking, MD      . aspirin chewable tablet 81 mg  81 mg Oral Daily Standley Brooking, MD   81 mg at 10/19/11 1000  . bisacodyl (DULCOLAX) EC tablet 5 mg  5 mg Oral Daily PRN Standley Brooking, MD      . brimonidine (ALPHAGAN) 0.2 % ophthalmic solution 1 drop  1 drop Both Eyes BID Standley Brooking, MD   1 drop at 10/19/11 1011   And  . timolol (TIMOPTIC) 0.5 % ophthalmic solution 1 drop  1 drop Both Eyes BID Standley Brooking, MD   1 drop at 10/19/11 1011  . carvedilol (COREG) tablet 12.5 mg  12.5 mg Oral BID WC Standley Brooking, MD   12.5 mg at 10/19/11 1643  . ceFEPIme (MAXIPIME) 2 g in dextrose 5 % 50 mL IVPB  2 g Intravenous Q24H Marinda Elk, MD   2 g at 10/19/11 1309  . insulin aspart (novoLOG) injection 0-9 Units  0-9 Units Subcutaneous TID WC Standley Brooking, MD   3 Units at 10/19/11 1302  . insulin glargine (LANTUS) injection 10 Units  10 Units Subcutaneous QHS Marinda Elk, MD      . magnesium oxide (MAG-OX) tablet 400 mg  400 mg Oral BID Standley Brooking,  MD   400 mg at 10/19/11 1303  . morphine 2 MG/ML injection 2 mg  2 mg Intravenous Q4H PRN Standley Brooking, MD   2 mg at 10/19/11 0458  . ondansetron (ZOFRAN) tablet 4 mg  4 mg Oral Q6H PRN Standley Brooking, MD       Or  . ondansetron [facility]) injection 4 mg  4 mg Intravenous Q6H PRN Standley Brooking, MD      . oxyCODONE-acetaminophen (PERCOCET) 5-325 MG per tablet 1-2 tablet  1-2 tablet Oral Q4H PRN Rolan Lipa, NP   1 tablet at 10/19/11 0709  . senna-docusate (Senokot-S) tablet 1 tablet  1 tablet Oral QHS PRN Standley Brooking, MD      . sodium chloride 0.9 % injection 3 mL  3 mL Intravenous Q12H Standley Brooking, MD   3 mL at 10/18/11 2159  . sodium chloride 0.9 % injection 3 mL  3 mL Intravenous PRN Standley Brooking, MD      . vancomycin (VANCOCIN) IVPB 1000 mg/200 mL premix  1,000 mg  Intravenous Q24H Ann Held, PHARMD   1,000 mg at 10/19/11 1643  . warfarin (COUMADIN) tablet 2.5 mg  2.5 mg Oral ONCE-1800 Abran Duke, PHARMD      . Warfarin - Pharmacist Dosing Inpatient   Does not apply q1800 Elonda Husky, PHARMD        REVIEW OF SYSTEMS: Arly.Keller ] denotes positive finding; [  ] denotes negative finding CARDIOVASCULAR:  [ ]  chest pain   [ ]  chest pressure   [ ]  palpitations   Arly.Keller ] orthopnea   [ ]  dyspnea on exertion   [ ]  claudication   [ ]  rest pain   [ ]  DVT   [ ]  phlebitis PULMONARY:   [ ]  productive cough   [ ]  asthma   [ ]  wheezing NEUROLOGIC:   [ ]  weakness  [ ]  paresthesias  [ ]  aphasia  [ ]  amaurosis  [ ]  dizziness HEMATOLOGIC:   [ ]  bleeding problems   [ ]  clotting disorders MUSCULOSKELETAL:  Arly.Keller ] joint pain- both knees   [ ]  joint swelling [ ]  leg swelling GASTROINTESTINAL: [ ]   blood in stool  [ ]   hematemesis GENITOURINARY:  [ ]   dysuria  [ ]   hematuria PSYCHIATRIC:  [ ]  history of major depression INTEGUMENTARY:  [ ]  rashes  Arly.Keller ] ulcers CONSTITUTIONAL:  [ ]  fever   [ ]  chills  PHYSICAL EXAM: Filed Vitals:   10/18/11 2118 10/19/11 0530 10/19/11 0654 10/19/11 1500  BP: 126/75 141/83  107/66  Pulse: 75 96  66  Temp: 99 F (37.2 C) 100.4 F (38 C) 99.6 F (37.6 C) 98.5 F (36.9 C)  TempSrc: Oral Oral Oral Oral  Resp: 20 18  18   Height:      Weight:      SpO2: 92% 95%  94%   Body mass index is 32.68 kg/(m^2). GENERAL: The patient is a well-nourished female, in no acute distress. The vital signs are documented above. CARDIOVASCULAR: There is a regular rate and rhythm without significant murmur appreciated. I do not detect carotid bruits. She has palpable femoral pulses bilaterally. I cannot palpate popliteal or pedal pulses. She has mild bilateral lower extremity swelling. PULMONARY: There is good air exchange bilaterally without wheezing or rales. ABDOMEN: Soft and non-tender with normal pitched bowel sounds.  MUSCULOSKELETAL: There are no  major deformities or cyanosis. NEUROLOGIC: No focal weakness or paresthesias  are detected. SKIN: she has hyperpigmentation bilaterally consistent with chronic venous insufficiency. She has a superficial ulceration in the anterior lateral aspect of her right leg with cellulitis. On the left leg she has an extensive essentially circumferential wound which is full thickness in multiple areas. His had some odor. She has cellulitis. There is mild drainage. PSYCHIATRIC: The patient has a normal affect.  DATA:  Lab Results  Component Value Date   WBC 11.6* 10/18/2011   HGB 11.7* 10/18/2011   HCT 35.5* 10/18/2011   MCV 94.2 10/18/2011   PLT 205 10/18/2011   Lab Results  Component Value Date   NA 134* 10/18/2011   K 4.5 10/18/2011   CL 99 10/18/2011   CO2 26 10/18/2011   Lab Results  Component Value Date   CREATININE 1.34* 10/18/2011   Lab Results  Component Value Date   INR 3.01* 10/19/2011   INR 2.20* 10/18/2011   INR 2.22* 10/17/2011   Lab Results  Component Value Date   HGBA1C 7.5* 10/18/2011   CBG (last 3)   Basename 10/19/11 1143 10/19/11 0805 10/18/11 2157  GLUCAP 212* 191* 176*   I performed a Doppler study at the bedside. She has brisk Doppler signals in the left foot in the posterior tibial, dorsalis pedis, and peroneal position although the signals are monophasic. On the right side she has brisk Doppler signals with monophasic flow.  A venous duplex scan and ABIs have been ordered by the primary service.  MEDICAL ISSUES:  CHRONIC VENOUS INSUFFICIENCY WITH BILATERAL LOWER EXTREMITY VENOUS STASIS ULCERS: Based on her exam and presentation I agreed that these are bilateral lower extremity venous stasis ulcers. I've explained that in addition to the wound care that she is receiving currently the treatment for venous stasis ulcers includes elevation and compression therapy. Her dressing changes should include a pressure dressing with a 4 inch and 6 inch Ace with graduated compression.  In addition I've explained that she needs to elevate her legs above her heart with the ankle above the knee and the knee above the hip. She does have some evidence of infrainguinal arterial occlusive disease. This could potentially compromise healing although based on her Doppler study I think she has a reasonable circulation.  PERIPHERAL VASCULAR DISEASE: Based on her exam she has evidence of infrainguinal arterial occlusive disease bilaterally. However she has fairly brisk Doppler signals and I believe has adequate circulation for wound healing. However, her ABIs are pending and will help determine whether or not her circulation is adequate. Toe pressures would also be helpful. Assuming her ABIs show reasonable blood flow I would not favor arteriography and revascularization unless her wounds were worsening or failed to show improvement with aggressive wound care, elevation, and compression. She would be at increased risk for surgery given her age and history of coronary artery disease.  Kalib Bhagat S [facility] [place] Beeper: [phone]

## 2011-10-19 NOTE — Progress Notes (Signed)
[facility] progress note. Chief complaint. Mild hypoxia. History of present illness. This 78 year old female in hospital with bilateral lower extremity cellulitis and leg ulcers. Patient has experienced fairly significant pain and has been receiving morphine 2 mg every 4 hours as needed. Staff noted the that they administered the usual dose of morphine and per routine vital signs about one hour later found her O2 sats to be 88% on room air. Patient was started on low-flow nasal cannula oxygen and her O2 sats improved in the mid 90s. He did come to see the patient at the bedside to assess her stability. Vital signs. Temperature 100.4, pulse 96, respiration 18, blood pressure 141/83. Current O2 sats 95% on low-flow nasal cannula oxygen. General appearance. This is a well-developed elderly female in no distress. She is alert, cooperative, and oriented. Cardiac. Rate and rhythm regular. She does have some peripheral edema. Lungs. Reduced in the mid lung to base on the right side. Otherwise clear and without distress. O2 sats on low-flow nasocannula oxygen are stable. Abdomen. Soft with positive bowel sounds. No pain. Impression/plan. Mild hypoxia. Etiology is unclear. Possibly her narcotics are causing some respiratory depression while she's sleeping. Unfortunately with the degree of pain she is having I see a little other option. I have asked for a portable chest x-ray to review her lung fields for any possible by him overload or pneumonia but clinically this does not appear to be the case. I will defer her continued morphine administration to the discretion of the rounding physician but currently the patient seems quite stable on low-flow nasocannula oxygen.

## 2011-10-19 NOTE — Progress Notes (Signed)
Informed by NT that oxygen saturation 86% on room air.  Placed pt on 4L  with sats at 95%.  Temp. 100.4.  Lung sounds clear bilaterally A&P, but left slightly more diminished than right.  Morphine 2mg  was administered at 04:58 for pain prior to episode.  Pt also has positive urine culture.  Informed Claiborne Billings, NP.  Claiborne Billings to assess pt.  Will continue to monitor.

## 2011-10-19 NOTE — Progress Notes (Signed)
ANTICOAGULATION & ANTIBIOTIC CONSULT NOTE - Follow Up Consult  Pharmacy Consult for Warfarin + Vancomycin Indication: Hx Afib and empiric coverage for B/L LE cellulitis  Allergies  Allergen Reactions  . Bactrim (Sulfamethoxazole W-Trimethoprim) Hives  . Adhesive (Tape)     WHITE tape: Causes blisters and skin reaction  . Sulfa Antibiotics   . Penicillins Itching and Rash    Childhood allergy:     Patient Measurements: Height: 5\' 8"  (172.7 cm) Weight: 214 lb 15.2 oz (97.5 kg) IBW/kg (Calculated) : 63.9   Vital Signs: Temp: 99.6 F (37.6 C) (07/13 0654) Temp src: Oral (07/13 0654) BP: 141/83 mmHg (07/13 0530) Pulse Rate: 96  (07/13 0530)  Labs:  Basename 10/19/11 0600 10/18/11 0535 10/17/11 1624  HGB -- 11.7* 12.6  HCT -- 35.5* 37.8  PLT -- 205 228  APTT -- -- --  LABPROT 31.7* 24.8* 25.0*  INR 3.01* 2.20* 2.22*  HEPARINUNFRC -- -- --  CREATININE -- 1.34* 1.25*  CKTOTAL -- -- --  CKMB -- -- --  TROPONINI -- -- --    Estimated Creatinine Clearance: 41.5 ml/min (by C-G formula based on Cr of 1.34).   Medications:  Anti-infectives     Start     Dose/Rate Route Frequency Ordered Stop   10/19/11 2200   levofloxacin (LEVAQUIN) IVPB 750 mg  Status:  Discontinued        750 mg 100 mL/hr over 90 Minutes Intravenous Every 48 hours 10/18/11 0830 10/18/11 0901   10/18/11 1600   vancomycin (VANCOCIN) IVPB 1000 mg/200 mL premix        1,000 mg 200 mL/hr over 60 Minutes Intravenous Every 24 hours 10/18/11 0944     10/18/11 1100   ceFEPIme (MAXIPIME) 2 g in dextrose 5 % 50 mL IVPB        2 g 100 mL/hr over 30 Minutes Intravenous Every 24 hours 10/18/11 0945     10/18/11 1000   ceFEPIme (MAXIPIME) 1 g in dextrose 5 % 50 mL IVPB  Status:  Discontinued        1 g 100 mL/hr over 30 Minutes Intravenous Every 12 hours 10/18/11 0901 10/18/11 0943   10/17/11 2200   levofloxacin (LEVAQUIN) IVPB 750 mg  Status:  Discontinued        750 mg 100 mL/hr over 90 Minutes Intravenous  Every 24 hours 10/17/11 2057 10/18/11 0830   10/17/11 1545   vancomycin (VANCOCIN) IVPB 1000 mg/200 mL premix        1,000 mg 200 mL/hr over 60 Minutes Intravenous  Once 10/17/11 1534 10/17/11 1749   10/17/11 1530   vancomycin (VANCOCIN) injection 1,000 mg  Status:  Discontinued        1,000 mg Intravenous  Once 10/17/11 1517 10/17/11 1533          Assessment: 78 y.o. F on warfarin resumed from PTA for hx Afib; INR today 3.01. Hgb/Hct/Plt slight drop on 7/12, no s/sx of bleeding noted.   The patient is also to resume Vancomycin per Rx  + Cefepime per MD for empiric coverage for potential B/L LE cellulitis. Wound cx from leg=abudant staph aureus/few gram neg rods; report pending. The patient received 1 dose of Vancomycin 1g at 1600 in the ED on 7/11. Afebrile, WBC 11.6, SCr up to 1.34 on 7/12, estimated CrCl~40-45 ml/min. Given bumps in SCr will dose conservatively and adjust dose as renal function stabilizes.    Goal of Therapy:  INR 2-3   Plan:  1. Warfarin 2.5  mg x 1 dose at 1800 today 2. Continue Vancomycin 1g IV every 24 hours 3. Will continue to monitor for any signs/symptoms of bleeding and will follow up with Daily PT/INR 4. Will continue to follow renal function 5. Monitor cx report for staph aureus in left leg wound  Abran Duke, PharmD Clinical Pharmacist Phone: [phone] Pager: [phone] 10/19/2011 8:40 AM

## 2011-10-19 NOTE — Evaluation (Signed)
Physical Therapy Evaluation Patient Details Name: Kathryn Schroeder MRN: [id number] DOB: [birth year] Today's Date: 10/19/2011 Time: 4098-1191 PT Time Calculation (min): 22 min  PT Assessment / Plan / Recommendation Clinical Impression  Pt. admitted with bilateral leg pain, cellulitis and ulcers which has limited her functional mobility and gait and decreased her activity tolerance.She will benefit from acute PT to address mobility issues in preparation for return home.      PT Assessment  Patient needs continued PT services    Follow Up Recommendations  Home health PT;Supervision/Assistance - 24 hour    Barriers to Discharge None Pt. says family will stay with her as much as needed.    Equipment Recommendations  None recommended by PT    Recommendations for Other Services     Frequency Min 3X/week    Precautions / Restrictions Precautions Precaution Comments: denies falls Restrictions Weight Bearing Restrictions: No   Pertinent Vitals/Pain Mild SOB with ambulation, unable to obtain sats as pulse ox would not register      Mobility  Bed Mobility Bed Mobility: Supine to Sit;Sit to Supine Supine to Sit: HOB elevated;With rails;4: Min guard Sit to Supine: 4: Min guard;With rail;HOB elevated Details for Bed Mobility Assistance: cues for technique Transfers Transfers: Sit to Stand;Stand to Sit Sit to Stand: 4: Min assist Stand to Sit: 4: Min assist Details for Transfer Assistance: remionders for hand placement, assist to rise to stand Ambulation/Gait Ambulation/Gait Assistance: 4: Min Environmental consultant (Feet): 20 Feet Assistive device: Rolling walker Ambulation/Gait Assistance Details: Pt. wuith increasing pain in lower legs during ambulation.  No overt LOB noted.   Gait Pattern: Step-through pattern Stairs: No    Exercises     PT Diagnosis: Difficulty walking;Acute pain  PT Problem List: Decreased activity tolerance;Decreased mobility;Decreased knowledge  of use of DME;Pain PT Treatment Interventions: DME instruction;Gait training;Functional mobility training;Therapeutic activities;Stair training;Patient/family education;Therapeutic exercise   PT Goals Acute Rehab PT Goals PT Goal Formulation: With patient Time For Goal Achievement: 11/02/11 Potential to Achieve Goals: Good Pt will go Supine/Side to Sit: with modified independence PT Goal: Supine/Side to Sit - Progress: Goal set today Pt will go Sit to Supine/Side: with modified independence PT Goal: Sit to Supine/Side - Progress: Goal set today Pt will go Sit to Stand: with modified independence PT Goal: Sit to Stand - Progress: Goal set today Pt will go Stand to Sit: with modified independence PT Goal: Stand to Sit - Progress: Goal set today Pt will Transfer Bed to Chair/Chair to Bed: with modified independence PT Transfer Goal: Bed to Chair/Chair to Bed - Progress: Goal set today Pt will Ambulate: 51 - 150 feet;with modified independence;with least restrictive assistive device PT Goal: Ambulate - Progress: Goal set today  Visit Information  Last PT Received On: 10/19/11 Assistance Needed: +1    Subjective Data  Subjective: My children live close by Patient Stated Goal: Like I was before   Prior Functioning  Home Living Lives With: Alone;Other (Comment) (says family will stay with her if needed) Available Help at Discharge: Family;Available PRN/intermittently Type of Home: House Home Access: Stairs to enter [employer] of Steps: 12 Entrance Stairs-Rails: Right;Left;Can reach both Home Layout: Laundry or work area in basement;Two level Bathroom Shower/Tub: Walk-in Contractor: Handicapped height Home Adaptive Equipment: Environmental consultant - four wheeled;Walker - rolling;Bedside commode/3-in-1 Prior Function Level of Independence: Independent Able to Take Stairs?: Yes Driving: Yes Vocation: Retired Comments: son has recently been doing her laundry  (basement) Communication Communication: No difficulties Dominant Hand:  Right    Cognition  Overall Cognitive Status: Appears within functional limits for tasks assessed/performed Arousal/Alertness: Awake/alert Orientation Level: Oriented X4 / Intact Behavior During Session: [facility] for tasks performed    Extremity/Trunk Assessment Right Upper Extremity Assessment RUE ROM/Strength/Tone: WFL for tasks assessed RUE Sensation: WFL - Light Touch RUE Coordination: WFL - gross/fine motor Left Upper Extremity Assessment LUE ROM/Strength/Tone: WFL for tasks assessed LUE Sensation: WFL - Light Touch LUE Coordination: WFL - gross/fine motor Right Lower Extremity Assessment RLE ROM/Strength/Tone: Within functional levels RLE Sensation: WFL - Light Touch RLE Coordination: WFL - gross motor Left Lower Extremity Assessment LLE ROM/Strength/Tone: Within functional levels LLE Sensation: WFL - Light Touch LLE Coordination: WFL - gross motor Trunk Assessment Trunk Assessment: Normal   Balance    End of Session PT - End of Session Equipment Utilized During Treatment: Gait belt Activity Tolerance: Patient limited by pain Patient left: in bed;with call bell/phone within reach;with family/visitor present;with nursing in room Nurse Communication: Mobility status  GP     Ferman Hamming 10/19/2011, 3:33 PM Acute Rehabilitation Services [phone] (pager)

## 2011-10-20 DIAGNOSIS — E119 Type 2 diabetes mellitus without complications: Secondary | ICD-10-CM

## 2011-10-20 LAB — WOUND CULTURE

## 2011-10-20 LAB — GLUCOSE, CAPILLARY: Glucose-Capillary: 207 mg/dL — ABNORMAL HIGH (ref 70–99)

## 2011-10-20 LAB — PROTIME-INR: Prothrombin Time: 31 seconds — ABNORMAL HIGH (ref 11.6–15.2)

## 2011-10-20 MED ORDER — WARFARIN SODIUM 5 MG PO TABS
5.0000 mg | ORAL_TABLET | Freq: Once | ORAL | Status: AC
  Administered 2011-10-20: 5 mg via ORAL
  Filled 2011-10-20: qty 1

## 2011-10-20 NOTE — Progress Notes (Signed)
VASCULAR PROGRESS NOTE  SUBJECTIVE: no specific complaints.  PHYSICAL EXAM: Filed Vitals:   10/19/11 1500 10/19/11 2106 10/20/11 0558 10/20/11 0709  BP: 107/66 124/72 180/79 126/78  Pulse: 66 68 89 87  Temp: 98.5 F (36.9 C) 98.5 F (36.9 C) 98.7 F (37.1 C) 98 F (36.7 C)  TempSrc: Oral Oral Oral   Resp: 18 18 20 18   Height:      Weight:      SpO2: 94% 95% 97% 95%   Wounds are wrapped. Some odor. Minimal drainage noted.  LABS: Lab Results  Component Value Date   WBC 11.6* 10/18/2011   HGB 11.7* 10/18/2011   HCT 35.5* 10/18/2011   MCV 94.2 10/18/2011   PLT 205 10/18/2011   Lab Results  Component Value Date   CREATININE 1.34* 10/18/2011   Lab Results  Component Value Date   INR 2.93* 10/20/2011   CBG (last 3)   Basename 10/20/11 0807 10/19/11 2105 10/19/11 1653  GLUCAP 176* 190* 238*   I have independently reviewed the lower extremity arterial duplex scan from 10/18/2011. This shows monophasic Doppler signals in both feet. ABI on the right is greater than 100%. ABI on the left is 85%. These ABIs are likely falsely elevated and she has severe calcific disease. Of note she does have some monophasic waveforms in the left common femoral artery suggesting possibly some proximal iliac disease on the left.  ASSESSMENT/PLAN: 1. This patient has severe chronic venous ulcers more significantly on the left leg. She is currently receiving intravenous antibiotics with aggressive wound care. She is also having Ace bandages applied to the legs for mild compression. We have also discussed the importance of leg elevation. If the wounds show continued improvement then this could be followed as an outpatient. The family would like to consider being evaluated at the [facility] wound care center. I would also be happy to follow these wounds as an outpatient. 2. In addition she does have evidence of multilevel arterial occlusive disease which could potentially compromise healing. However based on my exam  I thought she had reasonable Doppler flow in both legs. However, if the wounds failed to show improvement then consideration should be given to arteriography in order to evaluate her for revascularization. However, her age and multiple comorbidities including, mild chronic renal insufficiency, diabetes, history of atrial fibrillation, coronary artery disease, and hypertension, she would clearly be at increased risk for bypass surgery. Although she may have some iliac artery occlusive disease on the left I suspect that the more significant disease on the left is infrainguinal arterial occlusive disease and it is unlikely that this could simply be addressed with angioplasty.  Waverly Ferrari, MD, FACS Beeper: [phone] 10/20/2011

## 2011-10-20 NOTE — Progress Notes (Signed)
[facility] PROGRESS NOTE  Kathryn Schroeder ZOX:[id number] DOB: 15-Sep-1930 DOA: 10/17/2011   Assessment/Plan: Leg pain (10/17/2011)/Cellulitis (10/17/2011)/Bilateral leg ulcer (10/17/2011) -patient relates her bilateral leg pain is much improved, she has remained afebrile white count is trending down. She is on vancomycin and Unasyn. Wound care following. -pending wound cultures, ABIs : Significant decrease flow to the right lower extremity. -keep legs elevated above heart level. -Vascular surgery was consulted for followup and to see whether any surgical intervention was possible. Dr. Durwin Nora will like to go ahead and treat this conservatively and evaluate as an outpatient. -Patient and family will like wound care from [facility] to followup as an outpatient. -Cultures grew abundant staph awaiting final results, continue vancomycin.  Chronic anticoagulation () -continue coumadin, INR daily.  Atrial fibrillation () Rate controlled. INR supra-therapeutic.  Diabetes mellitus, type 2 (10/17/2011) -continues to improved, continue to hold metformin.  Family Communication: patient and son Disposition Plan: inpatient   LOS: 3 days   Procedures:  Doppler/ABI  Antibiotics:  Vanc/unasyn 10/18/2011   Subjective: Leg pain improved.  Objective: Filed Vitals:   10/19/11 1500 10/19/11 2106 10/20/11 0558 10/20/11 0709  BP: 107/66 124/72 180/79 126/78  Pulse: 66 68 89 87  Temp: 98.5 F (36.9 C) 98.5 F (36.9 C) 98.7 F (37.1 C) 98 F (36.7 C)  TempSrc: Oral Oral Oral   Resp: 18 18 20 18   Height:      Weight:      SpO2: 94% 95% 97% 95%    Intake/Output Summary (Last 24 hours) at 10/20/11 1056 Last data filed at 10/20/11 0900  Gross per 24 hour  Intake   2790 ml  Output      0 ml  Net   2790 ml   Weight change:   Exam:  General: Alert, awake, oriented x3, in no acute distress.  HEENT: No bruits, no goiter.  Heart: Regular rate and rhythm, without murmurs, rubs, gallops.   Lungs: Good air movement, bilateral air movement.  Extremities: b/l leg swelling, erythematous and tender to touch.   Data Reviewed: Basic Metabolic Panel:  Lab 10/18/11 8119 10/17/11 1624  NA 134* 136  K 4.5 5.0  CL 99 97  CO2 26 26  GLUCOSE 174* 228*  BUN 35* 34*  CREATININE 1.34* 1.25*  CALCIUM 9.3 9.7  MG -- --  PHOS -- --   Liver Function Tests:  Lab 10/17/11 1624  AST 18  ALT 9  ALKPHOS 79  BILITOT 0.9  PROT 8.2  ALBUMIN 3.6   No results found for this basename: LIPASE:5,AMYLASE:5 in the last 168 hours No results found for this basename: AMMONIA:5 in the last 168 hours CBC:  Lab 10/18/11 0535 10/17/11 1624  WBC 11.6* 12.7*  NEUTROABS -- 9.8*  HGB 11.7* 12.6  HCT 35.5* 37.8  MCV 94.2 93.3  PLT 205 228   Cardiac Enzymes: No results found for this basename: CKTOTAL:5,CKMB:5,CKMBINDEX:5,TROPONINI:5 in the last 168 hours BNP: No components found with this basename: POCBNP:5 CBG:  Lab 10/20/11 0807 10/19/11 2105 10/19/11 1653 10/19/11 1143 10/19/11 0805  GLUCAP 176* 190* 238* 212* 191*    Recent Results (from the past 240 hour(s))  URINE CULTURE     Status: Normal   Collection Time   10/17/11  3:32 PM      Component Value Range Status Comment   Specimen Description URINE, CLEAN CATCH   Final    Special Requests ADDED 10/17/11 1707   Final    Culture  Setup Time 10/17/2011  17:45   Final    Colony Count 15,000 COLONIES/ML   Final    Culture     Final    Value: Multiple bacterial morphotypes present, none predominant. Suggest appropriate recollection if clinically indicated.   Report Status 10/19/2011 FINAL   Final   WOUND CULTURE     Status: Normal (Preliminary result)   Collection Time   10/18/11 10:17 AM      Component Value Range Status Comment   Specimen Description WOUND LEFT LEG   Final    Special Requests Normal   Final    Gram Stain     Final    Value: NO WBC SEEN     NO SQUAMOUS EPITHELIAL CELLS SEEN     ABUNDANT GRAM POSITIVE COCCI IN  PAIRS     ABUNDANT GRAM NEGATIVE COCCOBACILLI   Culture     Final    Value: ABUNDANT STAPHYLOCOCCUS AUREUS     Note: RIFAMPIN AND GENTAMICIN SHOULD NOT BE USED AS SINGLE DRUGS FOR TREATMENT OF STAPH INFECTIONS.     FEW GRAM NEGATIVE RODS   Report Status PENDING   Incomplete      Studies: No results found.  Scheduled Meds:    . aspirin  81 mg Oral Daily  . brimonidine  1 drop Both Eyes BID   And  . timolol  1 drop Both Eyes BID  . carvedilol  12.5 mg Oral BID WC  . ceFEPime (MAXIPIME) IV  2 g Intravenous Q24H  . insulin aspart  0-9 Units Subcutaneous TID WC  . insulin glargine  10 Units Subcutaneous QHS  . magnesium oxide  400 mg Oral BID  . sodium chloride  3 mL Intravenous Q12H  . vancomycin  1,000 mg Intravenous Q24H  . warfarin  2.5 mg Oral ONCE-1800  . warfarin  5 mg Oral ONCE-1800  . Warfarin - Pharmacist Dosing Inpatient   Does not apply q1800   Continuous Infusions:    . sodium chloride 75 mL/hr at 10/20/11 0324    Lambert Keto, MD  [facility] Pager [phone]  If 7PM-7AM, please contact night-coverage [email or web address] Password [facility] 10/20/2011, 10:56 AM

## 2011-10-20 NOTE — Progress Notes (Signed)
ANTICOAGULATION CONSULT NOTE - Follow Up Consult  Pharmacy Consult for Coumadin Indication: h/o Afib  Allergies  Allergen Reactions  . Bactrim (Sulfamethoxazole W-Trimethoprim) Hives  . Adhesive (Tape)     WHITE tape: Causes blisters and skin reaction  . Sulfa Antibiotics   . Penicillins Itching and Rash    Childhood allergy:     Patient Measurements: Height: 5\' 8"  (172.7 cm) Weight: 214 lb 15.2 oz (97.5 kg) IBW/kg (Calculated) : 63.9    Vital Signs: Temp: 98 F (36.7 C) (07/14 0709) Temp src: Oral (07/14 0558) BP: 126/78 mmHg (07/14 0709) Pulse Rate: 87  (07/14 0709)  Labs:  Basename 10/20/11 0640 10/19/11 0600 10/18/11 0535 10/17/11 1624  HGB -- -- 11.7* 12.6  HCT -- -- 35.5* 37.8  PLT -- -- 205 228  APTT -- -- -- --  LABPROT 31.0* 31.7* 24.8* --  INR 2.93* 3.01* 2.20* --  HEPARINUNFRC -- -- -- --  CREATININE -- -- 1.34* 1.25*  CKTOTAL -- -- -- --  CKMB -- -- -- --  TROPONINI -- -- -- --    Estimated Creatinine Clearance: 41.5 ml/min (by C-G formula based on Cr of 1.34).   Medications:  Prescriptions prior to admission  Medication Sig Dispense Refill  . acetaminophen (TYLENOL) 325 MG tablet Take 325-650 mg by mouth daily as needed. For leg pain      . aspirin 81 MG tablet Take 81 mg by mouth every morning.       . brimonidine-timolol (COMBIGAN) 0.2-0.5 % ophthalmic solution Place 1 drop into both eyes 2 (two) times daily.      . carvedilol (COREG) 12.5 MG tablet Take 12.5 mg by mouth 2 (two) times daily with a meal.        . Cholecalciferol (VITAMIN D3) 400 UNITS CAPS Take by mouth every morning.       Marland Kitchen HYDROcodone-acetaminophen (NORCO) 5-325 MG per tablet Take 1-2 tablets by mouth every 6 (six) hours as needed. For pain.      Marland Kitchen losartan (COZAAR) 100 MG tablet Take 25 mg by mouth daily.       . magnesium oxide (MAG-OX) 400 MG tablet Take 400 mg by mouth 2 (two) times daily.        . metFORMIN (GLUCOPHAGE) 500 MG tablet Take 1,000-1,500 mg by mouth daily  with supper.       . Omega-3 Fatty Acids (FISH OIL) 1000 MG CAPS Take 1 capsule by mouth every morning.       . Red Yeast Rice Extract (RED YEAST RICE PO) Take 1.2 g by mouth at bedtime.      Marland Kitchen warfarin (COUMADIN) 5 MG tablet Take 2.5-5 mg by mouth daily. Patient takes 5 mg daily except 2.5 mg on Tuesday's and Saturday's       Scheduled:    . aspirin  81 mg Oral Daily  . brimonidine  1 drop Both Eyes BID   And  . timolol  1 drop Both Eyes BID  . carvedilol  12.5 mg Oral BID WC  . ceFEPime (MAXIPIME) IV  2 g Intravenous Q24H  . insulin aspart  0-9 Units Subcutaneous TID WC  . insulin glargine  10 Units Subcutaneous QHS  . magnesium oxide  400 mg Oral BID  . sodium chloride  3 mL Intravenous Q12H  . vancomycin  1,000 mg Intravenous Q24H  . warfarin  2.5 mg Oral ONCE-1800  . Warfarin - Pharmacist Dosing Inpatient   Does not apply [phone]  Assessment: Today the INR is 2.93 in this 77 y.o. F on warfarin resumed from PTA for hx Afib. No bleeding reported. PTA  dose of coumadin was 5mg  daily except 2.5mg  every Tues and Sat. INR remains therapeutic on continued PTA coumadin dosage.   Goal of Therapy:  INR 2-3 Monitor platelets by anticoagulation protocol: Yes   Plan:  Coumadin 5mg  today x1. Monitor daily PT/INR  Noah Delaine, RPh Clinical Pharmacist [phone] 10/20/2011,8:19 AM

## 2011-10-21 ENCOUNTER — Inpatient Hospital Stay (HOSPITAL_COMMUNITY): Payer: Medicare Other

## 2011-10-21 DIAGNOSIS — Z7901 Long term (current) use of anticoagulants: Secondary | ICD-10-CM

## 2011-10-21 LAB — GLUCOSE, CAPILLARY
Glucose-Capillary: 181 mg/dL — ABNORMAL HIGH (ref 70–99)
Glucose-Capillary: 130 mg/dL — ABNORMAL HIGH (ref 70–99)

## 2011-10-21 LAB — PROTIME-INR
INR: 2.88 — ABNORMAL HIGH (ref 0.00–1.49)
Prothrombin Time: 30.6 seconds — ABNORMAL HIGH (ref 11.6–15.2)

## 2011-10-21 MED ORDER — LORAZEPAM 2 MG/ML IJ SOLN: 0.5000 mg | Freq: Once | INTRAMUSCULAR | Status: DC

## 2011-10-21 MED ORDER — WARFARIN SODIUM 5 MG PO TABS
5.0000 mg | ORAL_TABLET | ORAL | Status: DC
  Administered 2011-10-21: 5 mg via ORAL
  Filled 2011-10-21 (×2): qty 1

## 2011-10-21 MED ORDER — CEFUROXIME AXETIL 500 MG PO TABS
500.0000 mg | ORAL_TABLET | Freq: Two times a day (BID) | ORAL | Status: DC
  Administered 2011-10-21 – 2011-10-25 (×8): 500 mg via ORAL
  Filled 2011-10-21 (×12): qty 1

## 2011-10-21 MED ORDER — DOXYCYCLINE HYCLATE 100 MG PO TABS
100.0000 mg | ORAL_TABLET | Freq: Two times a day (BID) | ORAL | Status: DC
  Administered 2011-10-21 (×2): 100 mg via ORAL
  Filled 2011-10-21 (×5): qty 1

## 2011-10-21 MED ORDER — WARFARIN SODIUM 2.5 MG PO TABS
2.5000 mg | ORAL_TABLET | ORAL | Status: DC
  Filled 2011-10-21: qty 1

## 2011-10-21 MED ORDER — LORAZEPAM 2 MG/ML IJ SOLN
INTRAMUSCULAR | Status: AC
  Administered 2011-10-21: 0.5 mg
  Filled 2011-10-21: qty 1

## 2011-10-21 MED ORDER — MORPHINE SULFATE 2 MG/ML IJ SOLN
2.0000 mg | INTRAMUSCULAR | Status: DC | PRN
  Administered 2011-10-21 (×2): 2 mg via INTRAVENOUS
  Filled 2011-10-21: qty 2
  Filled 2011-10-21 (×2): qty 1

## 2011-10-21 MED ORDER — OXYCODONE-ACETAMINOPHEN 5-325 MG PO TABS
1.0000 | ORAL_TABLET | ORAL | Status: DC
  Administered 2011-10-21 – 2011-10-22 (×5): 1 via ORAL
  Filled 2011-10-21 (×5): qty 1

## 2011-10-21 NOTE — Progress Notes (Signed)
Physical Therapy Treatment Patient Details Name: Kathryn Schroeder MRN: [id number] DOB: 03/27/1931 Today's Date: 10/21/2011 Time: 2130-8657 PT Time Calculation (min): 28 min  PT Assessment / Plan / Recommendation Comments on Treatment Session  Pt with bil LE cellulitis who is very lethargic today after receiving Ativan this am. Pt with urinary incontinence in standing and assist for pericare and linen change with RN notified of dressing saturation and need to change. Pt not progressing today due to cognitive status. Will follow to progress mobility.     Follow Up Recommendations       Barriers to Discharge        Equipment Recommendations       Recommendations for Other Services    Frequency     Plan Discharge plan remains appropriate;Frequency remains appropriate    Precautions / Restrictions Precautions Precautions: Fall   Pertinent Vitals/Pain No pain    Mobility  Bed Mobility Bed Mobility: Not assessed Transfers Transfers: Sit to Stand;Stand to Sit Sit to Stand: 1: +2 Total assist;From chair/3-in-1 Sit to Stand: Patient Percentage: 50% Stand to Sit: 1: +2 Total assist;To chair/3-in-1 Stand to Sit: Patient Percentage: 50% Details for Transfer Assistance: Attempted sit to stand x 3 with 1 person assist but unable to get pt further than anterior trunk translation. With 2 person assist knees blocked and assist for anterior translation pt stood with assist to then brings hands from armrests to RW. Pt stood x 2 min with min assist and constant VC for arousal. Assisted back to stting with cueing for sequence and hand over hand placement to grasp armrests Ambulation/Gait Ambulation/Gait Assistance: Not tested (comment) Wheelchair Mobility Wheelchair Mobility: No    Exercises General Exercises - Lower Extremity Long Arc Quad: AROM;Both;5 reps;Seated Hip Flexion/Marching: AROM;Both;5 reps;Seated   PT Diagnosis:    PT Problem List:   PT Treatment Interventions:     PT  Goals Acute Rehab PT Goals PT Goal: Sit to Stand - Progress: Not progressing PT Goal: Stand to Sit - Progress: Not progressing PT Transfer Goal: Bed to Chair/Chair to Bed - Progress: Not progressing  Visit Information  Last PT Received On: 10/21/11 Assistance Needed: +2    Subjective Data  Subjective: I'm trying- regarding keeping eyes open   Cognition  Overall Cognitive Status: Impaired Area of Impairment: Memory;Following commands;Attention Arousal/Alertness: Lethargic Orientation Level: Disoriented X4;Time;Situation Behavior During Session: Lethargic Current Attention Level: Focused Attention - Other Comments: pt with decreased arousal and at best focused attention    Balance  Static Standing Balance Static Standing - Balance Support: Bilateral upper extremity supported Static Standing - Level of Assistance: 4: Min assist Static Standing - Comment/# of Minutes: 2  End of Session PT - End of Session Equipment Utilized During Treatment: Gait belt Activity Tolerance: Other (comment) (pt limited by arousal presumed due to medication) Patient left: in chair;with call bell/phone within reach Nurse Communication: Mobility status   GP     Delorse Lek 10/21/2011, 8:55 AM Delaney Meigs, PT [phone]

## 2011-10-21 NOTE — Progress Notes (Signed)
Patient refuses turns stating pain is too great when turned, however she has a stage II on her buttocks documented.  She is also increasingly anxious.  MD notified and ativan IV and air overlay mattress ordered to protect skin integrity.  Will continue to monitor patient.

## 2011-10-21 NOTE — Progress Notes (Signed)
Inpatient Diabetes Program Recommendations  AACE/ADA: New Consensus Statement on Inpatient Glycemic Control (2009)  Target Ranges:  Prepandial:   less than 140 mg/dL      Peak postprandial:   less than 180 mg/dL (1-2 hours)      Critically ill patients:  140 - 180 mg/dL   Reason for Visit: Hyperglycemia  Inpatient Diabetes Program Recommendations Correction (SSI): Please add HS correction insulin scale..  This would help the fasting glucose improve in conjunction with the Lantus Insulin - Meal Coverage: Please consider addition of Novolog meal coverage, 3 units tidwc.  Note: Thank you, Lenor Coffin, RN, CNS, Diabetes Coordinator [phone])

## 2011-10-21 NOTE — Progress Notes (Signed)
ANTICOAGULATION CONSULT NOTE - Follow Up Consult  Pharmacy Consult for Coumadin Indication: h/o Afib  Allergies  Allergen Reactions  . Bactrim (Sulfamethoxazole W-Trimethoprim) Hives  . Adhesive (Tape)     WHITE tape: Causes blisters and skin reaction  . Sulfa Antibiotics     Hyperkalemia with bactrim  . Penicillins Itching and Rash    Childhood allergy:     Patient Measurements: Height: 5\' 8"  (172.7 cm) Weight: 214 lb 15.2 oz (97.5 kg) IBW/kg (Calculated) : 63.9    Vital Signs: Temp: 97.9 F (36.6 C) (07/15 0457) Temp src: Oral (07/15 0457) BP: 161/93 mmHg (07/15 0752) Pulse Rate: 67  (07/15 0752)  Labs:  Basename 10/21/11 0940 10/20/11 0640 10/19/11 0600  HGB -- -- --  HCT -- -- --  PLT -- -- --  APTT -- -- --  LABPROT 30.6* 31.0* 31.7*  INR 2.88* 2.93* 3.01*  HEPARINUNFRC -- -- --  CREATININE -- -- --  CKTOTAL -- -- --  CKMB -- -- --  TROPONINI -- -- --    Estimated Creatinine Clearance: 41.5 ml/min (by C-G formula based on Cr of 1.34).   Medications:  Prescriptions prior to admission  Medication Sig Dispense Refill  . acetaminophen (TYLENOL) 325 MG tablet Take 325-650 mg by mouth daily as needed. For leg pain      . aspirin 81 MG tablet Take 81 mg by mouth every morning.       . brimonidine-timolol (COMBIGAN) 0.2-0.5 % ophthalmic solution Place 1 drop into both eyes 2 (two) times daily.      . carvedilol (COREG) 12.5 MG tablet Take 12.5 mg by mouth 2 (two) times daily with a meal.        . Cholecalciferol (VITAMIN D3) 400 UNITS CAPS Take by mouth every morning.       Marland Kitchen HYDROcodone-acetaminophen (NORCO) 5-325 MG per tablet Take 1-2 tablets by mouth every 6 (six) hours as needed. For pain.      Marland Kitchen losartan (COZAAR) 100 MG tablet Take 25 mg by mouth daily.       . magnesium oxide (MAG-OX) 400 MG tablet Take 400 mg by mouth 2 (two) times daily.        . metFORMIN (GLUCOPHAGE) 500 MG tablet Take 1,000-1,500 mg by mouth daily with supper.       . Omega-3 Fatty  Acids (FISH OIL) 1000 MG CAPS Take 1 capsule by mouth every morning.       . Red Yeast Rice Extract (RED YEAST RICE PO) Take 1.2 g by mouth at bedtime.      Marland Kitchen warfarin (COUMADIN) 5 MG tablet Take 2.5-5 mg by mouth daily. Patient takes 5 mg daily except 2.5 mg on Tuesday's and Saturday's       Scheduled:     . aspirin  81 mg Oral Daily  . brimonidine  1 drop Both Eyes BID   And  . timolol  1 drop Both Eyes BID  . carvedilol  12.5 mg Oral BID WC  . cefUROXime  500 mg Oral BID WC  . doxycycline  100 mg Oral Q12H  . insulin aspart  0-9 Units Subcutaneous TID WC  . insulin glargine  10 Units Subcutaneous QHS  . LORazepam      . LORazepam  0.5 mg Intravenous Once  . magnesium oxide  400 mg Oral BID  . oxyCODONE-acetaminophen  1 tablet Oral Q4H  . sodium chloride  3 mL Intravenous Q12H  . warfarin  5 mg Oral ONCE-1800  .  Warfarin - Pharmacist Dosing Inpatient   Does not apply q1800  . DISCONTD: ceFEPime (MAXIPIME) IV  2 g Intravenous Q24H  . DISCONTD: vancomycin  1,000 mg Intravenous Q24H    Assessment: Today the INR is 2.88 in this 78 y.o. F on warfarin resumed from PTA for hx Afib. No bleeding reported. PTA  dose of coumadin was 5mg  daily except 2.5mg  every Tues and Sat. INR remains therapeutic. Will resume her home dose today.   Goal of Therapy:  INR 2-3 Monitor platelets by anticoagulation protocol: Yes   Plan:  Coumadin 5mg  qday except 2.5mg  Tue/sat Monitor daily PT/INR

## 2011-10-21 NOTE — Consult Note (Addendum)
Wound care follow-up:  Legs remain with full thickness wounds unchanged in sizes from previous assessment.  Decreased amt edema and erythremia   Remains with strong odor and small amt yellow-green drainage.  Slough beginning to loosen.  [facility]VS progress note indicates calcification and mixed etiology of arterial and venous components and they have ordered ace wraps to BLE over dressings for light compression.  Son at bedside states [facility]VS plans to follow after discharge.  Pt will require serial debridements to assist with removal of nonviable tissue once slough becomes less tightly adhered. Continue present plan of care with silver wound gel and non-adherent silicone dressing to decrease discomfort.  Left buttock with stage 2 wound, .6X.6X.1cm, 100% pink and dry.  Sacral area with stage 1; 5X5cm.  Pt on air mattress to reduce pressure to sites and is turned Q 2 hours.  Foam dressing to protect site and promote healing. Will not plan to follow further unless re-consulted.  [address], RN, MSN, [employer]  [phone]

## 2011-10-21 NOTE — Progress Notes (Signed)
[facility] PROGRESS NOTE  Kathryn Schroeder ZOX:[id number] DOB: 07-Oct-1930 DOA: 10/17/2011   Assessment/Plan: Leg pain (10/17/2011)/Cellulitis (10/17/2011)/Bilateral leg ulcer (10/17/2011) -patient relates her bilateral leg pain is much improved, she has remained afebrile white count is trending down. Antibiotics were changed to doxycycline and Cipro for total 14 days. -Cultures grew Klebsiella pneumonia MRSA.-Cultures grew Klebsiella pneumonia MRSA.-We'll watch her for the next 24 hours to see she spikes any fevers. - ABIs : Significant decrease flow to the right lower extremity. -keep legs elevated above heart level. -Vascular surgery was consulted for followup and to see whether any surgical intervention was possible. Dr. Durwin Nora will like to go ahead and treat this conservatively and evaluate as an outpatient. -We'll change her narcotic to schedule and IV morphine for when necessary.  Chronic anticoagulation () -continue coumadin, INR daily.  Atrial fibrillation () Rate controlled. INR supra-therapeutic.  Diabetes mellitus, type 2 (10/17/2011) -continues to improved, continue to hold metformin.  Family Communication: patient and son Disposition Plan: inpatient   LOS: 4 days   Procedures:  Doppler/ABI  Antibiotics:  Vanc/unasyn 10/18/2011>> 10/21/2011  Doxycycline and ciprofloxacin 10/21/2011   Subjective: Leg pain worse today. She did not have a good night.  Objective: Filed Vitals:   10/20/11 1500 10/20/11 2219 10/21/11 0457 10/21/11 0752  BP: 104/65 117/66 166/81 161/93  Pulse: 73 76 69 67  Temp: 98.4 F (36.9 C) 98.6 F (37 C) 97.9 F (36.6 C)   TempSrc: Oral Oral Oral   Resp: 18 18 18    Height:      Weight:      SpO2: 95% 92% 95%     Intake/Output Summary (Last 24 hours) at 10/21/11 0933 Last data filed at 10/20/11 1600  Gross per 24 hour  Intake   1410 ml  Output      0 ml  Net   1410 ml   Weight change:   Exam:  General: Alert, awake,  oriented x3, in no acute distress.  HEENT: No bruits, no goiter.  Heart: Regular rate and rhythm, without murmurs, rubs, gallops.  Lungs: Good air movement, bilateral air movement.  Extremities: Legs are wrapped in Ace bandages.   Data Reviewed: Basic Metabolic Panel:  Lab 10/18/11 8119 10/17/11 1624  NA 134* 136  K 4.5 5.0  CL 99 97  CO2 26 26  GLUCOSE 174* 228*  BUN 35* 34*  CREATININE 1.34* 1.25*  CALCIUM 9.3 9.7  MG -- --  PHOS -- --   Liver Function Tests:  Lab 10/17/11 1624  AST 18  ALT 9  ALKPHOS 79  BILITOT 0.9  PROT 8.2  ALBUMIN 3.6   No results found for this basename: LIPASE:5,AMYLASE:5 in the last 168 hours No results found for this basename: AMMONIA:5 in the last 168 hours CBC:  Lab 10/18/11 0535 10/17/11 1624  WBC 11.6* 12.7*  NEUTROABS -- 9.8*  HGB 11.7* 12.6  HCT 35.5* 37.8  MCV 94.2 93.3  PLT 205 228   Cardiac Enzymes: No results found for this basename: CKTOTAL:5,CKMB:5,CKMBINDEX:5,TROPONINI:5 in the last 168 hours BNP: No components found with this basename: POCBNP:5 CBG:  Lab 10/21/11 0847 10/20/11 2058 10/20/11 1836 10/20/11 1143 10/20/11 0807  GLUCAP 181* 224* 207* 252* 176*    Recent Results (from the past 240 hour(s))  URINE CULTURE     Status: Normal   Collection Time   10/17/11  3:32 PM      Component Value Range Status Comment   Specimen Description URINE, CLEAN CATCH  Final    Special Requests ADDED 10/17/11 1707   Final    Culture  Setup Time 10/17/2011 17:45   Final    Colony Count 15,000 COLONIES/ML   Final    Culture     Final    Value: Multiple bacterial morphotypes present, none predominant. Suggest appropriate recollection if clinically indicated.   Report Status 10/19/2011 FINAL   Final   WOUND CULTURE     Status: Normal   Collection Time   10/18/11 10:17 AM      Component Value Range Status Comment   Specimen Description WOUND LEFT LEG   Final    Special Requests Normal   Final    Gram Stain     Final     Value: NO WBC SEEN     NO SQUAMOUS EPITHELIAL CELLS SEEN     ABUNDANT GRAM POSITIVE COCCI IN PAIRS     ABUNDANT GRAM NEGATIVE COCCOBACILLI   Culture     Final    Value: ABUNDANT METHICILLIN RESISTANT STAPHYLOCOCCUS AUREUS     Note: RIFAMPIN AND GENTAMICIN SHOULD NOT BE USED AS SINGLE DRUGS FOR TREATMENT OF STAPH INFECTIONS. This organism DOES NOT demonstrate inducible Clindamycin resistance in vitro. CRITICAL RESULT CALLED TO, READ BACK BY AND VERIFIED WITH: PEGGY ELLER@1126       ON [id number] BY [facility]     FEW KLEBSIELLA PNEUMONIAE   Report Status 10/20/2011 FINAL   Final    Organism ID, Bacteria METHICILLIN RESISTANT STAPHYLOCOCCUS AUREUS   Final    Organism ID, Bacteria KLEBSIELLA PNEUMONIAE   Final      Studies: No results found.  Scheduled Meds:    . aspirin  81 mg Oral Daily  . brimonidine  1 drop Both Eyes BID   And  . timolol  1 drop Both Eyes BID  . carvedilol  12.5 mg Oral BID WC  . cefUROXime  500 mg Oral BID WC  . doxycycline  100 mg Oral Q12H  . insulin aspart  0-9 Units Subcutaneous TID WC  . insulin glargine  10 Units Subcutaneous QHS  . LORazepam      . LORazepam  0.5 mg Intravenous Once  . magnesium oxide  400 mg Oral BID  . oxyCODONE-acetaminophen  1 tablet Oral Q4H  . sodium chloride  3 mL Intravenous Q12H  . warfarin  5 mg Oral ONCE-1800  . Warfarin - Pharmacist Dosing Inpatient   Does not apply q1800  . DISCONTD: ceFEPime (MAXIPIME) IV  2 g Intravenous Q24H  . DISCONTD: vancomycin  1,000 mg Intravenous Q24H   Continuous Infusions:    . sodium chloride 75 mL/hr at 10/21/11 0655    Lambert Keto, MD  [facility] Pager [phone]  If 7PM-7AM, please contact night-coverage [email or web address] Password [facility] 10/21/2011, 9:33 AM

## 2011-10-22 ENCOUNTER — Inpatient Hospital Stay (HOSPITAL_COMMUNITY): Payer: Medicare Other

## 2011-10-22 DIAGNOSIS — J9601 Acute respiratory failure with hypoxia: Secondary | ICD-10-CM | POA: Diagnosis not present

## 2011-10-22 DIAGNOSIS — I359 Nonrheumatic aortic valve disorder, unspecified: Secondary | ICD-10-CM

## 2011-10-22 DIAGNOSIS — G9341 Metabolic encephalopathy: Secondary | ICD-10-CM | POA: Diagnosis not present

## 2011-10-22 DIAGNOSIS — R609 Edema, unspecified: Secondary | ICD-10-CM | POA: Diagnosis not present

## 2011-10-22 DIAGNOSIS — I7409 Other arterial embolism and thrombosis of abdominal aorta: Secondary | ICD-10-CM | POA: Diagnosis present

## 2011-10-22 LAB — CBC WITH DIFFERENTIAL/PLATELET
Eosinophils Absolute: 0.1 10*3/uL (ref 0.0–0.7)
Eosinophils Relative: 1 % (ref 0–5)
HCT: 34.1 % — ABNORMAL LOW (ref 36.0–46.0)
Hemoglobin: 11.4 g/dL — ABNORMAL LOW (ref 12.0–15.0)
Lymphs Abs: 0.9 10*3/uL (ref 0.7–4.0)
MCH: 30.8 pg (ref 26.0–34.0)
MCV: 92.2 fL (ref 78.0–100.0)
Monocytes Absolute: 1.9 10*3/uL — ABNORMAL HIGH (ref 0.1–1.0)
Monocytes Relative: 15 % — ABNORMAL HIGH (ref 3–12)
RBC: 3.7 MIL/uL — ABNORMAL LOW (ref 3.87–5.11)

## 2011-10-22 LAB — BASIC METABOLIC PANEL
CO2: 23 mEq/L (ref 19–32)
Calcium: 9 mg/dL (ref 8.4–10.5)
Calcium: 9.2 mg/dL (ref 8.4–10.5)
GFR calc Af Amer: 74 mL/min — ABNORMAL LOW (ref >90)
GFR calc non Af Amer: 63 mL/min — ABNORMAL LOW (ref >90)
Glucose, Bld: 148 mg/dL — ABNORMAL HIGH (ref 70–99)
Glucose, Bld: 235 mg/dL — ABNORMAL HIGH (ref 70–99)
Potassium: 5.2 mEq/L — ABNORMAL HIGH (ref 3.5–5.1)
Potassium: 4.7 mEq/L (ref 3.5–5.1)
Sodium: 135 mEq/L (ref 135–145)
Sodium: 134 mEq/L — ABNORMAL LOW (ref 135–145)

## 2011-10-22 LAB — GLUCOSE, CAPILLARY
Glucose-Capillary: 148 mg/dL — ABNORMAL HIGH (ref 70–99)
Glucose-Capillary: 192 mg/dL — ABNORMAL HIGH (ref 70–99)
Glucose-Capillary: 275 mg/dL — ABNORMAL HIGH (ref 70–99)

## 2011-10-22 LAB — BLOOD GAS, ARTERIAL
Acid-base deficit: 1.7 mmol/L (ref 0.0–2.0)
TCO2: 24.7 mmol/L (ref 0–100)
pCO2 arterial: 44.8 mmHg (ref 35.0–45.0)

## 2011-10-22 LAB — PROTIME-INR: INR: 3.98 — ABNORMAL HIGH (ref 0.00–1.49)

## 2011-10-22 MED ORDER — LEVALBUTEROL HCL 0.63 MG/3ML IN NEBU
0.6300 mg | INHALATION_SOLUTION | Freq: Four times a day (QID) | RESPIRATORY_TRACT | Status: DC
  Administered 2011-10-22 (×2): 0.63 mg via RESPIRATORY_TRACT
  Filled 2011-10-22 (×11): qty 3

## 2011-10-22 MED ORDER — FUROSEMIDE 10 MG/ML IJ SOLN
20.0000 mg | Freq: Once | INTRAMUSCULAR | Status: AC
  Administered 2011-10-22: 20 mg via INTRAVENOUS
  Filled 2011-10-22: qty 2

## 2011-10-22 MED ORDER — METHYLPREDNISOLONE SODIUM SUCC 125 MG IJ SOLR
90.0000 mg | Freq: Once | INTRAMUSCULAR | Status: AC
  Administered 2011-10-22: 90 mg via INTRAVENOUS

## 2011-10-22 MED ORDER — METHYLPREDNISOLONE SODIUM SUCC 40 MG IJ SOLR: 40.0000 mg | Freq: Two times a day (BID) | INTRAMUSCULAR | Status: DC

## 2011-10-22 MED ORDER — OXYCODONE-ACETAMINOPHEN 5-325 MG PO TABS: 1.0000 | ORAL_TABLET | ORAL | Status: DC | PRN

## 2011-10-22 MED ORDER — LEVALBUTEROL HCL 0.63 MG/3ML IN NEBU
0.6300 mg | INHALATION_SOLUTION | Freq: Four times a day (QID) | RESPIRATORY_TRACT | Status: DC | PRN
  Filled 2011-10-22: qty 3

## 2011-10-22 MED ORDER — METHYLPREDNISOLONE SODIUM SUCC 125 MG IJ SOLR
80.0000 mg | Freq: Four times a day (QID) | INTRAMUSCULAR | Status: DC
  Filled 2011-10-22 (×5): qty 1.28

## 2011-10-22 MED ORDER — TRAMADOL HCL 50 MG PO TABS
50.0000 mg | ORAL_TABLET | Freq: Four times a day (QID) | ORAL | Status: DC | PRN
  Administered 2011-10-22 – 2011-10-24 (×8): 50 mg via ORAL
  Filled 2011-10-22 (×8): qty 1

## 2011-10-22 MED ORDER — LEVOFLOXACIN IN D5W 750 MG/150ML IV SOLN
750.0000 mg | INTRAVENOUS | Status: DC
  Administered 2011-10-22 – 2011-10-23 (×2): 750 mg via INTRAVENOUS
  Filled 2011-10-22 (×3): qty 150

## 2011-10-22 MED ORDER — METHYLPREDNISOLONE SODIUM SUCC 40 MG IJ SOLR
40.0000 mg | Freq: Two times a day (BID) | INTRAMUSCULAR | Status: AC
  Administered 2011-10-22 – 2011-10-23 (×2): 40 mg via INTRAVENOUS
  Filled 2011-10-22 (×2): qty 1

## 2011-10-22 MED ORDER — IOHEXOL 350 MG/ML SOLN
100.0000 mL | Freq: Once | INTRAVENOUS | Status: AC | PRN
  Administered 2011-10-22: 100 mL via INTRAVENOUS

## 2011-10-22 NOTE — Progress Notes (Signed)
Patient being transferred to 6738 via her bed. Family members present and aware of the new room number. Phone report called to Royal Kunia, Charity fundraiser.

## 2011-10-22 NOTE — Progress Notes (Signed)
Clinical Social Work Department BRIEF PSYCHOSOCIAL ASSESSMENT 10/22/2011  Patient:  Kathryn Schroeder, Kathryn Schroeder     Account Number:  [id number]     Admit date:  10/17/2011  Clinical Social Worker:  Dennison Bulla  Date/Time:  10/22/2011 02:00 PM  Referred by:  Physician  Date Referred:  10/22/2011 Referred for  SNF Placement   Other Referral:   Interview type:  Patient Other interview type:   Dtr-Kathryn Schroeder present    PSYCHOSOCIAL DATA Living Status:  ALONE Admitted from facility:   Level of care:   Primary support name:  Kathryn Schroeder Primary support relationship to patient:  CHILD, ADULT Degree of support available:   Adequate    CURRENT CONCERNS Current Concerns  Post-Acute Placement   Other Concerns:    SOCIAL WORK ASSESSMENT / PLAN CSW received referral for possible SNF placement. CSW reviewed chart and met with patient at bedside. Dtr was present. Patient agreeable to dtr being involved in care.    CSW introduced myself and explained role. CSW spoke with patient regarding prior living arrangements. Patient reports that she was independent prior to admission and completed all ADL. Dtr confirmed this information. CSW spoke with patient and dtr regarding having 24 hour assistance at dc. Dtr reports she is concerned about patient returning home since she lives alone. Per dtr, patient is "not acting like herself and talking out of her head". Dtr reports that medication has made patient be confused. Dtr reports she and family cannot offer 24 hour care. CSW discussed SNF option. CSW provided family with SNF list and explained Medicare benefits. Dtr reports if patient recovers then she could possibly return home but is agreeable to SNF search. Dtr prefers [facility] or [facility].    CSW completed FL2, submitted pasarr and faxed patient out. CSW will follow up with bed offers.   Assessment/plan status:  Psychosocial Support/Ongoing Assessment of Needs Other assessment/ plan:     Information/referral to community resources:   SNF list    PATIENT'S/FAMILY'S RESPONSE TO PLAN OF CARE: Patient was confused throughout assessment. Dtr was engaged throughout assessment and asked appropriate questions. Dtr reviewed list and is agreeable to SNF search.

## 2011-10-22 NOTE — Progress Notes (Addendum)
Clinical Social Work Department CLINICAL SOCIAL WORK PLACEMENT NOTE 10/22/2011  Patient:  Kathryn Schroeder, Kathryn Schroeder  Account Number:  [id number] Admit date:  10/17/2011  Clinical Social Worker:  Unk Lightning, LCSW  Date/time:  10/22/2011 02:00 PM  Clinical Social Work is seeking post-discharge placement for this patient at the following level of care:   SKILLED NURSING   (*CSW will update this form in Epic as items are completed)   10/22/2011  Patient/family provided with [facility] Department of Clinical Social Work's list of facilities offering this level of care within the geographic area requested by the patient (or if unable, by the patient's family).  10/22/2011  Patient/family informed of their freedom to choose among providers that offer the needed level of care, that participate in Medicare, Medicaid or managed care program needed by the patient, have an available bed and are willing to accept the patient.  10/22/2011  Patient/family informed of [facility]' ownership interest in [facility], as well as of the fact that they are under no obligation to receive care at this facility.  PASARR submitted to EDS on 10/22/2011 PASARR number received from EDS on 10/22/2011  FL2 transmitted to all facilities in geographic area requested by pt/family on  10/22/2011 FL2 transmitted to all facilities within larger geographic area on   Patient informed that his/her managed care company has contracts with or will negotiate with  certain facilities, including the following:     Patient/family informed of bed offers received: 10/24/11  Patient chooses bed at [facility] Physician recommends and patient chooses bed at    Patient to be transferred to [facility] on 10/25/11   Patient to be transferred to facility by ambulance  The following physician request were entered in Epic:   Additional Comments: 10/24/11 - Patient and family informed that [facility] made bed offer and [facility] had not  responded. Calls made to [facility] and messages left with no response. Patient and family fine with [facility]. Call made to [facility] and message left with Marta Lamas in admissions regarding patient discharging to their facility on Friday, 7/19. Alvina Chou, LCSW

## 2011-10-22 NOTE — Progress Notes (Signed)
Patient has shallow breathing, complaining of SOB, and confusion.  Called hospitalist and chest xray ordered.  Patient continued to have labored breathing with use of accessory muscles and wheezing, hospitalist arrived and ordered labs, medications.  Patient is still working hard to breath and the order is to transfer to stepdown.  Macarthur Critchley, RN

## 2011-10-22 NOTE — Progress Notes (Signed)
10/22/11 1230  PT Visit Information  Last PT Received On 10/22/11  PT Time Calculation  PT Start Time 1147  PT Stop Time 1230  PT Time Calculation (min) 43 min  Subjective Data  Subjective No I'm not ready to get rid of the RW  Patient Stated Goal Like I was before  Cognition  Overall Cognitive Status Appears within functional limits for tasks assessed/performed  Arousal/Alertness Lethargic  Orientation Level Appears intact for tasks assessed  Behavior During Session [facility] for tasks performed  Current Attention Level Sustained  Bed Mobility  Bed Mobility Supine to Sit;Sitting - Scoot to Edge of Bed  Supine to Sit 4: Min assist  Sitting - Scoot to Edge of Bed 4: Min guard  Sit to Supine 4: Min guard;With rail;HOB elevated  Details for Bed Mobility Assistance cues for technique and safety  Transfers  Transfers Sit to Stand;Stand to Sit  Sit to Stand 4: Min assist;3: Mod assist;From bed;From toilet (mod assist from low toilet)  Stand to Sit 4: Min guard;3: Mod assist;To chair/3-in-1;To toilet (mod to low toilet)  Details for Transfer Assistance vc's for technique and safety, stability and mild lifting assist  Ambulation/Gait  Ambulation/Gait Assistance 4: Min guard;4: Min assist (min for maneuvering RW)  Ambulation Distance (Feet) 85 Feet  Assistive device Rolling walker  Ambulation/Gait Assistance Details min guard for safety, min for maneuvering RW, VC's for safety issues  Gait Pattern Step-through pattern  Balance  Balance Assessed No  Total Joint Exercises  Heel Slides Strengthening;Both;10 reps;Supine;Other (comment) (resisted)  PT - End of Session  Activity Tolerance Patient tolerated treatment well  Patient left in chair;with call bell/phone within reach;with family/visitor present  Nurse Communication Mobility status  PT - Assessment/Plan  Comments on Treatment Session Pt much improved with activity tolerance today.  LE' pain appears to be managed, but pt remains  weak.  Discussed likely progression of pt leaving soon with HHPT and family staying with pt in the short-term.  PT Plan Discharge plan remains appropriate;Frequency remains appropriate  Follow Up Recommendations Home health PT;Supervision/Assistance - 24 hour  Equipment Recommended None recommended by PT  Acute Rehab PT Goals  Potential to Achieve Goals Good  PT Goal: Supine/Side to Sit - Progress Progressing toward goal  PT Goal: Sit to Supine/Side - Progress Progressing toward goal  PT Goal: Sit to Stand - Progress Progressing toward goal  PT Goal: Stand to Sit - Progress Progressing toward goal  PT Transfer Goal: Bed to Chair/Chair to Bed - Progress Progressing toward goal  PT Goal: Ambulate - Progress Progressing toward goal  PT General Charges  $$ ACUTE PT VISIT 1 Procedure  PT Treatments  $Gait Training 8-22 mins  $Therapeutic Activity 23-37 mins   10/22/2011  Greenfield Bing, PT [phone] (pager)

## 2011-10-22 NOTE — Progress Notes (Signed)
[facility] Progress Note [place] TEAM 1 - Stepdown/ICU TEAM   BATUL DIEGO JWJ:[id number] DOB: [birth year] DOA: 10/17/2011 PCP: Romeo Rabon, MD  Assessment/Plan: Principal Problem:  *Leg pain/ Bilateral leg ulcer/Aorto-iliac disease (MILD) *Stable *Lower strategy arterial Dopplers and ABIs indicate only mild aortic iliac disease *Continue aspirin *She has also been evaluated by vascular surgery this admission and at this time arteriography and revascularization are not indicated and unless she worsens or fails to show improvement with aggressive wound care, elevation and compression. The vascular M.D. also endorses patient would be at increased risk for surgery given her age and her history of coronary artery disease.  Active Problems:  Acute respiratory failure with hypoxia/Interstitial edema *Suspect probable volume overload *Has diuresed almost 1500 cc since was given Lasix overnight and respiratory status has improved markedly *Check 2-D echocardiogram-last echocardiogram was completed in 2008 *Continues to require nasal cannula oxygen *Was started empirically on Solu-Medrol-currently not wheezing and see no other indications for this medication therefore we'll taper down to a lower dosage and give 2 more dosages with automatic stop date *Continue nebs and other supportive care including oxygen * placed on levaquin for possible pna- per ct chest.  Metabolic encephalopathy *Suspect related to narcotic medications *On her current medications have been discontinued in favor of Tylenol   Atrial fibrillation with controlled ventricular response *Rate remains controlled   Hyperkalemia *Etiology unclear and when reviewing her problem list is as noted she has had this in the past *Repeat electrolyte panel this afternoon and in the morning   Cellulitis *Extremities currently show no skin changes consistent with cellulitis *Continue cefuroxime   Chronic  anticoagulation *INR has crept up to 3.98 which is supratherapeutic and suspect this is related to concomitant use of *Coumadin dosing managed by pharmacist   HTN (hypertension) *Blood pressure is well managed and actually somewhat soft after administration of Lasix overnight *Continue carvedilol   Diabetes mellitus type 2, controlled *Continue sliding scale insulin and Lantus *No significant increase in CBGs with the addition of steroids   Code Status: Full  Family Communication: Patient decompensated overnight in the nighttime nurse practitioner did speak with the patient's daughter regarding CODE STATUS. Daughter plans on speaking with the rest of the family before deciding against a full CODE STATUS and is aware that if the patient continues to decompensate from respiratory standpoint she may have to be intubated. At this point the daughter wishes for the patient to remain a full code and be intubated if necessary. Since that time the patient has stabilized.  Disposition Plan: Transfer to telemetry  Brief narrative: 78 year old woman who presented to the emergency department with bilateral lower leg pain and ulcers. Symptoms began to half months ago when she developed a small ulcer on the back of her left calf. This was thought to be a spider bite and was treated with antibiotic. She was hospitalized shortly thereafter with severe hyperkalemia thought to be secondary to this antibiotic (Septra?). She reports subsequently she has been on to other antibiotics which she cannot recall. Her primary care physician referred her to the wound care center where she has been receiving treatment for the last 2 and half months.  Despite wound care treatment her left leg continued to worsen in regard to ulceration and pain. Approximately 2 weeks ago she developed a similar ulcer on her right leg and increased pain in this leg as well. She has had intermittent swelling over the last 2 and half months and  intermittent erythema  of the lower legs. She was recently placed in boots and referred to infectious disease physician who subsequently recommended she seek out hospital care. She denied any injury and no previous history of leg problems. The pain in her leg was constant and severe in nature and is worse with ambulation.   Consultants: Jaynie Collins, M.D./vascular surgery  Procedures: Lower extremity arterial Dopplers and ABIs  Antibiotics: Cefuroxime started 10/18/2011 Levaquin started 10/17/2011 resumed today.  HPI/Subjective: She is alert and denies any problems with shortness of breath, chest pain, abdominal pain or nausea and vomiting.   Objective: Blood pressure 118/60, pulse 85, temperature 97.2 F (36.2 C), temperature source Oral, resp. rate 20, height 5\' 7"  (1.702 m), weight 100.6 kg (221 lb 12.5 oz), SpO2 97.00%.  Intake/Output Summary (Last 24 hours) at 10/22/11 1318 Last data filed at 10/22/11 1200  Gross per 24 hour  Intake 1986.25 ml  Output   1925 ml  Net  61.25 ml     Exam: General: Awake and in no acute distress, appears older than stated age Lungs: Clear to auscultation bilaterally without wheezes or crackles, 2 L nasal cannula oxygen, no tachypnea or labored respiratory effort Cardiovascular: Regular rate and rhythm without murmur gallop or rub normal S1 and S2, no edema or cyanosis Abdomen: Nontender, nondistended, soft, bowel sounds positive, no rebound, no ascites, no appreciable mass Genitourinary: Foley catheter to straight drain with clearly yellow urine Musculoskeletal: No significant cyanosis, clubbing; patient has dressings with bilateral Ace wraps applied, no cellulitic skin changes noted Neurological: Patient is awake although little sleepy but easily arousable in no focal neurological deficits are appreciated  Data Reviewed: Basic Metabolic Panel:  Lab 10/22/11 1610 10/18/11 0535 10/17/11 1624  NA 135 134* 136  K 5.2* 4.5 5.0  CL 101 99  97  CO2 23 26 26   GLUCOSE 148* 174* 228*  BUN 23 35* 34*  CREATININE 0.77 1.34* 1.25*  CALCIUM 9.0 9.3 9.7  MG -- -- --  PHOS -- -- --   Liver Function Tests:  Lab 10/17/11 1624  AST 18  ALT 9  ALKPHOS 79  BILITOT 0.9  PROT 8.2  ALBUMIN 3.6   No results found for this basename: LIPASE:5,AMYLASE:5 in the last 168 hours No results found for this basename: AMMONIA:5 in the last 168 hours CBC:  Lab 10/22/11 0151 10/18/11 0535 10/17/11 1624  WBC 13.2* 11.6* 12.7*  NEUTROABS 10.2* -- 9.8*  HGB 11.4* 11.7* 12.6  HCT 34.1* 35.5* 37.8  MCV 92.2 94.2 93.3  PLT 243 205 228   Cardiac Enzymes: No results found for this basename: CKTOTAL:5,CKMB:5,CKMBINDEX:5,TROPONINI:5 in the last 168 hours BNP (last 3 results)  Basename 10/22/11 0341  PROBNP 5797.0*   CBG:  Lab 10/22/11 1137 10/22/11 0720 10/22/11 0114 10/21/11 2143 10/21/11 1705  GLUCAP 192* 148* 121* 141* 130*    Recent Results (from the past 240 hour(s))  URINE CULTURE     Status: Normal   Collection Time   10/17/11  3:32 PM      Component Value Range Status Comment   Specimen Description URINE, CLEAN CATCH   Final    Special Requests ADDED 10/17/11 1707   Final    Culture  Setup Time 10/17/2011 17:45   Final    Colony Count 15,000 COLONIES/ML   Final    Culture     Final    Value: Multiple bacterial morphotypes present, none predominant. Suggest appropriate recollection if clinically indicated.   Report Status 10/19/2011 FINAL  Final   WOUND CULTURE     Status: Normal   Collection Time   10/18/11 10:17 AM      Component Value Range Status Comment   Specimen Description WOUND LEFT LEG   Final    Special Requests Normal   Final    Gram Stain     Final    Value: NO WBC SEEN     NO SQUAMOUS EPITHELIAL CELLS SEEN     ABUNDANT GRAM POSITIVE COCCI IN PAIRS     ABUNDANT GRAM NEGATIVE COCCOBACILLI   Culture     Final    Value: ABUNDANT METHICILLIN RESISTANT STAPHYLOCOCCUS AUREUS     Note: RIFAMPIN AND GENTAMICIN  SHOULD NOT BE USED AS SINGLE DRUGS FOR TREATMENT OF STAPH INFECTIONS. This organism DOES NOT demonstrate inducible Clindamycin resistance in vitro. CRITICAL RESULT CALLED TO, READ BACK BY AND VERIFIED WITH: PEGGY ELLER@1126       ON [id number] BY [facility]     FEW KLEBSIELLA PNEUMONIAE   Report Status 10/20/2011 FINAL   Final    Organism ID, Bacteria METHICILLIN RESISTANT STAPHYLOCOCCUS AUREUS   Final    Organism ID, Bacteria KLEBSIELLA PNEUMONIAE   Final      Studies:  Recent x-ray studies have been reviewed in detail by the Attending Physician  Scheduled Meds:  Reviewed in detail by the Attending Physician   Junious Silk, ANP [facility] Office  [phone] Pager [phone]  On-Call/Text Page:      [email or web address]      password [email or web address]  If 7PM-7AM, please contact night-coverage [email or web address] Password [email or web address] 10/22/2011, 1:18 PM   LOS: 5 days   I saw and examined Ms Gonsalves at bed side in presence of her daughter. Switched doxycycline to levaquin for better coverage of klebsiella, especially forr the lungs.  Dawson Hollman,MD pager#[phone].

## 2011-10-22 NOTE — Progress Notes (Signed)
  Echocardiogram 2D Echocardiogram has been performed.  Kathryn Schroeder 10/22/2011, 4:46 PM

## 2011-10-22 NOTE — Progress Notes (Signed)
VASCULAR PROGRESS NOTE  SUBJECTIVE: Leg pain has been improving.  PHYSICAL EXAM: Filed Vitals:   10/22/11 0630 10/22/11 0722 10/22/11 1231 10/22/11 1438  BP: 155/73 118/60 107/75   Pulse: 85     Temp: 98.1 F (36.7 C) 98.6 F (37 C) 97.2 F (36.2 C)   TempSrc:  Axillary Oral   Resp:      Height: 5\' 7"  (1.702 m)     Weight: 221 lb 12.5 oz (100.6 kg)     SpO2:   97% 96%   Min drainage on dressings which were just changed. Feet warm  LABS: Lab Results  Component Value Date   WBC 13.2* 10/22/2011   HGB 11.4* 10/22/2011   HCT 34.1* 10/22/2011   MCV 92.2 10/22/2011   PLT 243 10/22/2011   Lab Results  Component Value Date   CREATININE 0.77 10/22/2011   Lab Results  Component Value Date   INR 3.98* 10/22/2011   CBG (last 3)   Basename 10/22/11 1137 10/22/11 0720 10/22/11 0114  GLUCAP 192* 148* 121*     ASSESSMENT/PLAN: 1. No change in plan from my standpoint. Continue dressing changes, antibiotics, elevation, & compression. 2. I can follow wounds as an outpt. As long as wounds are continuing to gradually improve, will hold off on arteriography.  Waverly Ferrari, MD, FACS Beeper: [phone] 10/22/2011

## 2011-10-22 NOTE — Progress Notes (Addendum)
Patient ID: Kathryn Schroeder, female   DOB: 1930/06/29, 78 y.o.   MRN: [id number]  See H&P and daily progress notes since admission for details of hospitalization.  S: RN called NP earlier in shift 2/2 increased work of breathing and some confusion. NP to bedside. Pt says she is SOB. She denies any chest pain. Per RN, she just received Percocet which is ordered ATC for her cellulitis pain. Daughter says pt was confused, talking about people who weren't here.  O: All VS reviewed. HR is normal and her BP is less than 170 systolic and has not acutely increased. Pt is lying in bed with eyes closed. She appears fairly unwell but is in NAD. She arouses to name calling. She is able to tell me the month, year, place, and recognizes her daughter. She is sleepy. Pt on 2L [place] with normal sats, however, she is working hard to breathe, using abdominal muscles and tachypneic. Lungs are tight with expiratory wheezing. CXR revealed possible early interstitial edema vs. PNA vs. Bronchitis. Pallor, skin is dry. CBG was normal. A/P:  1. Increased work of breathing-I am concerned that this lady may not be able to compensate for long even given the fact that her ABG didn't show retained CO2. We had placed her on NRB but her PO2 was high, so weaned her down to [place] again and her sats have stayed normal. She is tight with wheezing which has improved some after Solumedrol and Xopenex, but she conts to have tachypnea with use of accessory muscles. She has some cardio hx and given her CXR, WBCC with neuts increased and possible early PNA and bronchitis, feel it is prudent to move her to step down and start her on some Bipap to see if this eases her breathing. We are going to get a CTA to r/o PE b/c so far, there is no obvious cause of her tachypnea. Her BMP shows normal creat now and GFR of 78 and she is making urine. We gave Lasix x one dose given possible interstitial edema on CXR. Pro BNP is pending. She is already on 3 abx including  Vanc for her cellulitis, so we will cont those.  2. AFib-her INR is supra-therapeutic and managed by pharmacy. Will be sure to hold her coumadin for now. Her HR is normal.  3. ARF-labs back to normal. CTA. 4. Confusion-this seemed to be possibly associated with the Percocet. So will d/c that for now.   Discussed case with Dr. Toniann Fail who also came to see the pt and review chart and gave orders.   Maren Reamer, NP [facility]  Addendum: This NP spoke with daughter about code status. Says her mother always said she "didn't want to be a vegetable", but the actual discussion has never occurred. Daughter would like to speak to rest of the family before deciding anything different. I explained that her mother is ill and may have to be intubated if respiratory status declines further. Daughter understands this and for now, the pt will be a FULL CODE and be intubated if necessary.  Maren Reamer, NP  Addendum [phone]: Called [phone] to see how pt was doing. RN says she is less tachyneic and satting well. RN says she looks comfortable. ELink camera'd in to look at pt. Pt on [place] O2 still and we will just monitor now instead of applying bipap. CTA neg for PE. Will report to oncoming docs.  Maren Reamer, NP

## 2011-10-22 NOTE — Consult Note (Signed)
PHARMACY CONSULT NOTE   Pharmacy Consult for Levaquin, Coumadin Indication: Cellulitis, atrial fibrillation   Allergies  Allergen Reactions  . Bactrim (Sulfamethoxazole W-Trimethoprim) Hives  . Adhesive (Tape)     WHITE tape: Causes blisters and skin reaction  . Sulfa Antibiotics     Hyperkalemia with bactrim  . Penicillins Itching and Rash    Childhood allergy:     Patient Measurements: Height: 5\' 7"  (170.2 cm) Weight: 221 lb 12.5 oz (100.6 kg) IBW/kg (Calculated) : 61.6   Vital Signs: Temp: 97.2 F (36.2 C) (07/16 1231) Temp src: Oral (07/16 1231) BP: 118/60 mmHg (07/16 0722) Pulse Rate: 85  (07/16 0630)  SpO2 Readings from Last 3 Encounters:  10/22/11 97%  08/23/11 94%   Intake/Output from previous day: 07/15 0701 - 07/16 0700 In: 1989.3 [I.V.:1989.3] Out: 1450 [Urine:1450] Intake/Output from this shift: Total I/O In: -  Out: 475 [Urine:475]  Labs:  [facility] 10/22/11 0151  WBC 13.2*  HGB 11.4*  PLT 243  LABCREA --  CREATININE 0.77   Lab Results  Component Value Date   INR 3.98* 10/22/2011   INR 2.88* 10/21/2011   INR 2.93* 10/20/2011    Estimated Creatinine Clearance: 69.5 ml/min (by C-G formula based on Cr of 0.77).  Microbiology: Recent Results (from the past 720 hour(s))  URINE CULTURE     Status: Normal   Collection Time   10/17/11  3:32 PM      Component Value Range Status Comment   Specimen Description URINE, CLEAN CATCH   Final    Special Requests ADDED 10/17/11 1707   Final    Culture  Setup Time 10/17/2011 17:45   Final    Colony Count 15,000 COLONIES/ML   Final    Culture     Final    Value: Multiple bacterial morphotypes present, none predominant. Suggest appropriate recollection if clinically indicated.   Report Status 10/19/2011 FINAL   Final   WOUND CULTURE     Status: Normal   Collection Time   10/18/11 10:17 AM      Component Value Range Status Comment   Specimen Description WOUND LEFT LEG   Final    Special Requests Normal    Final    Gram Stain     Final    Value: NO WBC SEEN     NO SQUAMOUS EPITHELIAL CELLS SEEN     ABUNDANT GRAM POSITIVE COCCI IN PAIRS     ABUNDANT GRAM NEGATIVE COCCOBACILLI   Culture     Final    Value: ABUNDANT METHICILLIN RESISTANT STAPHYLOCOCCUS AUREUS     Note: RIFAMPIN AND GENTAMICIN SHOULD NOT BE USED AS SINGLE DRUGS FOR TREATMENT OF STAPH INFECTIONS. This organism DOES NOT demonstrate inducible Clindamycin resistance in vitro. CRITICAL RESULT CALLED TO, READ BACK BY AND VERIFIED WITH: PEGGY ELLER@1126       ON [id number] BY [facility]     FEW KLEBSIELLA PNEUMONIAE   Report Status 10/20/2011 FINAL   Final    Organism ID, Bacteria METHICILLIN RESISTANT STAPHYLOCOCCUS AUREUS   Final    Organism ID, Bacteria KLEBSIELLA PNEUMONIAE   Final     Medical History: Past Medical History  Diagnosis Date  . Coronary artery disease     STATUS POST OLD INFERIOR WALL M.I.  . Diabetes mellitus   . Chronic anticoagulation   . Atrial fibrillation   . MI, old 2008    Medications:  Scheduled:    . aspirin  81 mg Oral Daily  . brimonidine  1 drop Both  Eyes BID   And  . timolol  1 drop Both Eyes BID  . carvedilol  12.5 mg Oral BID WC  . cefUROXime  500 mg Oral BID WC  . furosemide  20 mg Intravenous Once  . insulin aspart  0-9 Units Subcutaneous TID WC  . insulin glargine  10 Units Subcutaneous QHS  . levalbuterol  0.63 mg Nebulization Q6H  . magnesium oxide  400 mg Oral BID  . methylPREDNISolone (SOLU-MEDROL) injection  90 mg Intravenous Once  . methylPREDNISolone (SOLU-MEDROL) injection  40 mg Intravenous Q12H  . sodium chloride  3 mL Intravenous Q12H  . warfarin  2.5 mg Oral Custom  . warfarin  5 mg Oral Custom  . Warfarin - Pharmacist Dosing Inpatient   Does not apply q1800  . DISCONTD: doxycycline  100 mg Oral Q12H  . DISCONTD: LORazepam  0.5 mg Intravenous Once  . DISCONTD: methylPREDNISolone (SOLU-MEDROL) injection  80 mg Intravenous Q6H  . DISCONTD: methylPREDNISolone (SOLU-MEDROL)  injection  40 mg Intravenous Q12H  . DISCONTD: oxyCODONE-acetaminophen  1 tablet Oral Q4H   Infusions:    . sodium chloride 75 mL/hr at 10/21/11 0655   Anti-infectives     Start     Dose/Rate Route Frequency Ordered Stop   10/21/11 1000   doxycycline (VIBRA-TABS) tablet 100 mg  Status:  Discontinued        100 mg Oral Every 12 hours 10/21/11 0915 10/22/11 1140   10/21/11 1000   cefUROXime (CEFTIN) tablet 500 mg        500 mg Oral 2 times daily with meals 10/21/11 0915     10/19/11 2200   levofloxacin (LEVAQUIN) IVPB 750 mg  Status:  Discontinued        750 mg 100 mL/hr over 90 Minutes Intravenous Every 48 hours 10/18/11 0830 10/18/11 0901   10/18/11 1600   vancomycin (VANCOCIN) IVPB 1000 mg/200 mL premix  Status:  Discontinued        1,000 mg 200 mL/hr over 60 Minutes Intravenous Every 24 hours 10/18/11 0944 10/21/11 0914   10/18/11 1100   ceFEPIme (MAXIPIME) 2 g in dextrose 5 % 50 mL IVPB  Status:  Discontinued        2 g 100 mL/hr over 30 Minutes Intravenous Every 24 hours 10/18/11 0945 10/21/11 0914   10/18/11 1000   ceFEPIme (MAXIPIME) 1 g in dextrose 5 % 50 mL IVPB  Status:  Discontinued        1 g 100 mL/hr over 30 Minutes Intravenous Every 12 hours 10/18/11 0901 10/18/11 0943   10/17/11 2200   levofloxacin (LEVAQUIN) IVPB 750 mg  Status:  Discontinued        750 mg 100 mL/hr over 90 Minutes Intravenous Every 24 hours 10/17/11 2057 10/18/11 0830   10/17/11 1545   vancomycin (VANCOCIN) IVPB 1000 mg/200 mL premix        1,000 mg 200 mL/hr over 60 Minutes Intravenous  Once 10/17/11 1534 10/17/11 1749   10/17/11 1530   vancomycin (VANCOCIN) injection 1,000 mg  Status:  Discontinued        1,000 mg Intravenous  Once 10/17/11 1517 10/17/11 1533         Assessment:  Patient is a 78 y/o female on chronic Coumadin therapy for history of atrial fibrillation.    Day # 6 antibiotic therapy for leg cellulitis.  Vancomycin and doxycycline have been discontinued, cefuroxime  continued.  Levaquin per protocol has been restarted [received 1 dose 10/17/11].  She  is currently not taking po's well, nor any recorded meals/snacks.  Goal of Therapy:   Levaquin to broaden coverage-no renal adjustments required.  INR goal  2 - 3  Plan:   No Coumadin today.  With addition of Levaquin today, I expect that the INR will remain above 3 for the time being.  Will begin Levaquin 750 mg IV q 24 hours until patient is eating and taking po's well, then change Levaquin to po--same dose.  Anelly Samarin, Elisha Headland, Pharm.D.  10/22/2011 1:32 PM

## 2011-10-23 DIAGNOSIS — J9600 Acute respiratory failure, unspecified whether with hypoxia or hypercapnia: Secondary | ICD-10-CM

## 2011-10-23 LAB — CBC
HCT: 34.8 % — ABNORMAL LOW (ref 36.0–46.0)
Hemoglobin: 11.7 g/dL — ABNORMAL LOW (ref 12.0–15.0)
MCH: 30.6 pg (ref 26.0–34.0)
MCHC: 33.6 g/dL (ref 30.0–36.0)
RBC: 3.82 MIL/uL — ABNORMAL LOW (ref 3.87–5.11)

## 2011-10-23 LAB — GLUCOSE, CAPILLARY
Glucose-Capillary: 275 mg/dL — ABNORMAL HIGH (ref 70–99)
Glucose-Capillary: 383 mg/dL — ABNORMAL HIGH (ref 70–99)
Glucose-Capillary: 309 mg/dL — ABNORMAL HIGH (ref 70–99)

## 2011-10-23 LAB — BASIC METABOLIC PANEL
BUN: 37 mg/dL — ABNORMAL HIGH (ref 6–23)
Chloride: 98 mEq/L (ref 96–112)
Glucose, Bld: 293 mg/dL — ABNORMAL HIGH (ref 70–99)
Potassium: 5.1 mEq/L (ref 3.5–5.1)
Sodium: 135 mEq/L (ref 135–145)

## 2011-10-23 MED ORDER — INSULIN ASPART 100 UNIT/ML ~~LOC~~ SOLN
0.0000 [IU] | Freq: Three times a day (TID) | SUBCUTANEOUS | Status: DC
  Administered 2011-10-23: 11 [IU] via SUBCUTANEOUS
  Administered 2011-10-24: 3 [IU] via SUBCUTANEOUS
  Administered 2011-10-24: 5 [IU] via SUBCUTANEOUS
  Administered 2011-10-24: 8 [IU] via SUBCUTANEOUS
  Administered 2011-10-25: 2 [IU] via SUBCUTANEOUS

## 2011-10-23 NOTE — Progress Notes (Signed)
[facility] PROGRESS NOTE  Kathryn Schroeder:[id number] DOB: May 22, 1930 DOA: 10/17/2011 PCP: Romeo Rabon, MD  Assessment/Plan: Principal Problem:  *Leg pain Active Problems:  Chronic anticoagulation  Atrial fibrillation with controlled ventricular response  HTN (hypertension)  Hyperkalemia  Diabetes mellitus type 2, controlled  Cellulitis  Bilateral leg ulcer  Aorto-iliac disease (MILD)  Acute respiratory failure with hypoxia  Interstitial edema  Metabolic encephalopathy   Active Problems:  1. Leg pain/ Bilateral leg ulcer/Aorto-iliac disease (MILD)  This was a large part of patient's presenting problems. Lower strategy arterial Dopplers and ABIs indicate only mild aortic iliac disease. She has also been evaluated by Dr Edilia Bo, vascular surgeon this admission and at this time arteriography and revascularization are not indicated, unless she worsens or fails to show improvement with aggressive wound care, elevation and compression. Furthermore, patient would be at increased risk for surgery given her age and her history of coronary artery disease. Continued on Aspirin.  2. Acute respiratory failure with hypoxia/Interstitial edema: Patient decompensated in early AM of 10/22/11, with dyspnea, respiratory distress and wheeze, against a background of CXR 10/21/11, indicating shallow inspiration with elevation of right hemidiaphragm. Increasing interstitial changes. Patient was treated with diuretics on suspicion of probable volume overload, diuresed almost 1500 cc and respiratory status improved markedly. CTA of 10/22/11 showed No evidence of significant pulmonary embolus. Small bilateral pleural effusions with atelectasis or infiltration in the lung bases. 2-D echocardiogram showed normal LV size and systolic function, EF 55-60%. Moderate diastolic dysfunction. Mild to moderate AS (mild by mean gradient, moderate by appearance). Severe MAC and severely calcified anterior leaflet  of the mitral valve without significant stenosis. Normal RV size and systolic function. Moderate pulmonary hypertension. Solu-Medrol was discontinued on 10/22/11. Currently not wheezing. Continue nebs and other supportive care including oxygen. Levaquin was started on 10/22/11, for "Chest Ct findings". Patient has no productive cough or pyrexia.  3. Metabolic encephalopathy:   Transient confusion, likely related to acute problems and narcotic medication. Today, mental status is at baseline. Pre-admission opioid analgesics have been discontinued in favor of Tylenol. 4. Atrial fibrillation with controlled ventricular response: Rate remains controlled. Other wise stable. Anticoagulation is being managed by clinical pharmacologist.  5. Hyperkalemia: Etiology unclear, although patient did indicate that she was on oral Potassium at some point, when she was on oral diuretics. Apparently, this has been discontinued. Following lytes.  6. Lower extremity wounds/Cellulitis:   Patient presented with bilateral lower leg pain and ulcers and had been receiving treatment for the last 2 and half months for lesions on her left leg, without improvement. The right lower extremity lesion developed approximately 2 weeks ago. Since hospitalization, she has been managed with cephalosporins, Vancomycin and briefly, Doxycycline. Wound cultures grew Klebsiella Pneumoniae and MRSA. She is currently on Keflex, and is being managed with Local care. Given the multiplicity of antibiotic changes, I feel that the best approach would be to consult ID to rationalize therapy, in view of bacteriologic findings, appearance of the wounds and equivocal chest Ct findings.  7. Chronic anticoagulation  INR has crept up to a high of 3.98 which is supratherapeutic. As described above, Coumadin dosing is being managed by pharmacist. 8. HTN (hypertension)  Blood pressure is sub-optimal today. Continue Carvedilol and observe. Will consider re-starting  Losartan on 10/24/11. 9. Diabetes mellitus type 2, controlled:   Per patient DM was well controlled pre-admission, and she has even had a few hypoglycemic episodes. Pre-admission, she was on Metformin. We shall check HBA1C. CBGs are elevated  today, presumably due to brief steroid therapy. Will continue SSI/Lantus (have increased SSI to moderate scale on 10/23/11).   Code Status: Full Family Communication:  Disposition Plan: For SNF, when stable.    Brief narrative: 78 year old woman who presented to the emergency department with bilateral lower leg pain and ulcers. Symptoms began to half months ago when she developed a small ulcer on the back of her left calf. This was thought to be a spider bite and was treated with antibiotic. She was hospitalized shortly thereafter with severe hyperkalemia thought to be secondary to this antibiotic (Septra?). She reports subsequently she has been on other antibiotics, which she cannot recall. Her primary care physician referred her to the wound care center where she has been receiving treatment for the last 2 and half months.  Despite wound care treatment her left leg continued to worsen in regard to ulceration and pain. Approximately 2 weeks ago she developed a similar ulcer on her right leg and increased pain in this leg as well. She has had intermittent swelling over the last 2 and half months and intermittent erythema of the lower legs. She was recently placed in graduated hose and referred to infectious disease physician who subsequently recommended she seek out hospital care. She denied any injury and has no previous history of leg problems. The pain in her leg was constant and severe in nature and was worse with ambulation.  Consultants: Jaynie Collins, M.D./vascular surgery   Procedures: Lower extremity arterial Dopplers and ABIs. 2D Echocardiogram.    Antibiotics: See discussion.  HPI/Subjective: Feels better today.   Objective: Filed Vitals:    10/22/11 1620 10/22/11 2020 10/23/11 0549 10/23/11 0923  BP: 133/72 154/84 150/81 149/73  Pulse:    84  Temp: 98.5 F (36.9 C) 99 F (37.2 C) 98.6 F (37 C) 97.3 F (36.3 C)  TempSrc: Oral Oral Oral Oral  Resp:  20 18 18   Height:      Weight:  90.9 kg (200 lb 6.4 oz)    SpO2:  96% 94% 93%    Intake/Output Summary (Last 24 hours) at 10/23/11 1700 Last data filed at 10/23/11 0900  Gross per 24 hour  Intake    240 ml  Output    550 ml  Net   -310 ml    Exam:  General: Comfortable, alert, communicative, fully oriented, not short of breath at rest.  HEENT:  Mild clinical pallor, no jaundice, no conjunctival injection or discharge. Hydration appears fair.  NECK:  Supple, JVP not seen, no carotid bruits, no palpable lymphadenopathy, no palpable goiter. CHEST:  Clinically clear to auscultation, no wheezes, no crackles. HEART:  Sounds 1 and 2 heard, normal, irregular, no murmurs. ABDOMEN:  Moderately obese, soft, non-tender, no palpable organomegaly, no palpable masses, normal bowel sounds. GENITALIA:  Not examined. LOWER EXTREMITIES:  Bilateral ACE wraps. . MUSCULOSKELETAL SYSTEM:  Generalized osteoarthritic changes, otherwise, normal. CENTRAL NERVOUS SYSTEM:  No focal neurologic deficit on gross examination.  Data Reviewed: Basic Metabolic Panel:  Lab 10/23/11 1610 10/22/11 1717 10/22/11 0151 10/18/11 0535 10/17/11 1624  NA 135 134* 135 134* 136  K 5.1 4.7 5.2* 4.5 5.0  CL 98 98 101 99 97  CO2 23 23 23 26 26   GLUCOSE 293* 235* 148* 174* 228*  BUN 37* 30* 23 35* 34*  CREATININE 0.92 0.85 0.77 1.34* 1.25*  CALCIUM 9.3 9.2 9.0 9.3 9.7  MG -- -- -- -- --  PHOS -- -- -- -- --   Liver  Function Tests:  Lab 10/17/11 1624  AST 18  ALT 9  ALKPHOS 79  BILITOT 0.9  PROT 8.2  ALBUMIN 3.6   No results found for this basename: LIPASE:5,AMYLASE:5 in the last 168 hours No results found for this basename: AMMONIA:5 in the last 168 hours CBC:  Lab 10/23/11 0620 10/22/11 0151  10/18/11 0535 10/17/11 1624  WBC 11.5* 13.2* 11.6* 12.7*  NEUTROABS -- 10.2* -- 9.8*  HGB 11.7* 11.4* 11.7* 12.6  HCT 34.8* 34.1* 35.5* 37.8  MCV 91.1 92.2 94.2 93.3  PLT 257 243 205 228   Cardiac Enzymes: No results found for this basename: CKTOTAL:5,CKMB:5,CKMBINDEX:5,TROPONINI:5 in the last 168 hours BNP (last 3 results)  Basename 10/22/11 0341  PROBNP 5797.0*   CBG:  Lab 10/23/11 1645 10/23/11 1150 10/23/11 0740 10/22/11 2149 10/22/11 1618  GLUCAP 309* 383* 275* 275* 246*    Recent Results (from the past 240 hour(s))  URINE CULTURE     Status: Normal   Collection Time   10/17/11  3:32 PM      Component Value Range Status Comment   Specimen Description URINE, CLEAN CATCH   Final    Special Requests ADDED 10/17/11 1707   Final    Culture  Setup Time 10/17/2011 17:45   Final    Colony Count 15,000 COLONIES/ML   Final    Culture     Final    Value: Multiple bacterial morphotypes present, none predominant. Suggest appropriate recollection if clinically indicated.   Report Status 10/19/2011 FINAL   Final   WOUND CULTURE     Status: Normal   Collection Time   10/18/11 10:17 AM      Component Value Range Status Comment   Specimen Description WOUND LEFT LEG   Final    Special Requests Normal   Final    Gram Stain     Final    Value: NO WBC SEEN     NO SQUAMOUS EPITHELIAL CELLS SEEN     ABUNDANT GRAM POSITIVE COCCI IN PAIRS     ABUNDANT GRAM NEGATIVE COCCOBACILLI   Culture     Final    Value: ABUNDANT METHICILLIN RESISTANT STAPHYLOCOCCUS AUREUS     Note: RIFAMPIN AND GENTAMICIN SHOULD NOT BE USED AS SINGLE DRUGS FOR TREATMENT OF STAPH INFECTIONS. This organism DOES NOT demonstrate inducible Clindamycin resistance in vitro. CRITICAL RESULT CALLED TO, READ BACK BY AND VERIFIED WITH: PEGGY ELLER@[id number]       ON [id number] BY [facility]     FEW KLEBSIELLA PNEUMONIAE   Report Status 10/20/2011 FINAL   Final    Organism ID, Bacteria METHICILLIN RESISTANT STAPHYLOCOCCUS AUREUS   Final     Organism ID, Bacteria KLEBSIELLA PNEUMONIAE   Final      Studies: Ct Angio Chest W/cm &/or Wo Cm  10/22/2011  *RADIOLOGY REPORT*  Clinical Data: Shortness of breath.  Chest pain. Tachypnea.  CT ANGIOGRAPHY CHEST  Technique:  Multidetector CT imaging of the chest using the standard protocol during bolus administration of intravenous contrast. Multiplanar reconstructed images including MIPs were obtained and reviewed to evaluate the vascular anatomy.  Contrast: OMNIPAQUE IOHEXOL 350 MG/ML SOLN  Comparison: None.  Findings: Technically adequate study with good opacification of the central and segmental pulmonary arteries.  No focal filling defects.  No evidence of significant pulmonary embolus.  Normal heart size.  Coronary artery calcification.  Normal caliber thoracic aorta with calcification.  No significant lymphadenopathy in the chest.  Esophagus is decompressed.  Diffuse enlargement of the thyroid gland  with heterogeneous parenchymal pattern and multiple calcifications.  Changes most likely represent multinodular goiter.  Visualized portions of the upper abdominal organs demonstrate cholelithiasis and extensive vascular calcification.  Visualization of the lungs is limited due to respiratory motion artifact.  There are small bilateral pleural effusions with basilar atelectasis or infiltration bilaterally.  These changes are more prominent on the right.  The airways appear patent.  No pneumothorax.  Degenerative changes in the thoracic spine.  IMPRESSION: No evidence of significant pulmonary embolus.  Small bilateral pleural effusions with atelectasis or infiltration in the lung bases.  Diffuse enlargement of the thyroid gland with multiple nodules and multiple calcifications.  Original Report Authenticated By: Marlon Pel, M.D.   Dg Chest Port 1 View  10/21/2011  *RADIOLOGY REPORT*  Clinical Data: Increasing wheezing and respiratory distress.  PORTABLE CHEST - 1 VIEW  Comparison: 10/19/2011   Findings: Shallow inspiration with elevation of the right hemidiaphragm.  Heart size and pulmonary vascularity are normal. Increasing interstitial changes may represent developing interstitial edema, bronchitis, or pneumonia.  No blunting of costophrenic angles.  No pneumothorax.  Calcification of the aorta. Degenerative changes in the spine and shoulders.  IMPRESSION: Shallow inspiration with elevation of right hemidiaphragm. Increasing interstitial changes as described.  Original Report Authenticated By: Marlon Pel, M.D.   Dg Chest Port 1 View  10/19/2011  *RADIOLOGY REPORT*  Clinical Data: Hypoxia.  PORTABLE CHEST - 1 VIEW  Comparison: 08/21/2011 and 08/07/2004  Findings: Again noted is prominent paratracheal soft tissue which appears chronic and may be vascular in etiology.  There is stable elevation of the right hemidiaphragm.  Few densities at the lung bases are suggestive for atelectasis.  The heart size is grossly stable.  Negative for a pneumothorax.  IMPRESSION: Mild basilar atelectasis without focal chest disease.  Prominent paratracheal soft tissue appears stable.  This may be related to vascular structures.  Original Report Authenticated By: Richarda Overlie, M.D.    Scheduled Meds:   . aspirin  81 mg Oral Daily  . brimonidine  1 drop Both Eyes BID   And  . timolol  1 drop Both Eyes BID  . carvedilol  12.5 mg Oral BID WC  . cefUROXime  500 mg Oral BID WC  . insulin aspart  0-9 Units Subcutaneous TID WC  . insulin glargine  10 Units Subcutaneous QHS  . levofloxacin  750 mg Intravenous Q24H  . magnesium oxide  400 mg Oral BID  . methylPREDNISolone (SOLU-MEDROL) injection  40 mg Intravenous Q12H  . sodium chloride  3 mL Intravenous Q12H  . Warfarin - Pharmacist Dosing Inpatient   Does not apply q1800  . DISCONTD: levalbuterol  0.63 mg Nebulization Q6H   Continuous Infusions:   . sodium chloride 75 mL/hr at 10/21/11 1610    Principal Problem:  *Leg pain Active Problems:   Chronic anticoagulation  Atrial fibrillation with controlled ventricular response  HTN (hypertension)  Hyperkalemia  Diabetes mellitus type 2, controlled  Cellulitis  Bilateral leg ulcer  Aorto-iliac disease (MILD)  Acute respiratory failure with hypoxia  Interstitial edema  Metabolic encephalopathy     Kathryn Schroeder,CHRISTOPHER  [facility] Pager [phone]. If 8PM-8AM, please contact night-coverage at [email or web address], password [email or web address] 10/23/2011, 5:00 PM  LOS: 6 days

## 2011-10-23 NOTE — Evaluation (Signed)
Occupational Therapy Evaluation Patient Details Name: Kathryn Schroeder MRN: [id number] DOB: 30-Jul-1930 Today's Date: 10/23/2011 Time: 1308-6578 OT Time Calculation (min): 57 min  OT Assessment / Plan / Recommendation Clinical Impression  Pt is a 78 yo female who presents with B LE pain and cellulitis. Pt and family now agreeable to snf. Pt very weak and deconditioned and presents as a high fall risk at this time. Skilled OT indicated to maximize I w/BADLs to min A level in prep for d/c to next venue of care.    OT Assessment  Patient needs continued OT Services    Follow Up Recommendations  Skilled nursing facility    Barriers to Discharge Decreased caregiver support    Equipment Recommendations  Defer to next venue    Recommendations for Other Services    Frequency  Min 2X/week    Precautions / Restrictions Precautions Precautions: Fall Restrictions Weight Bearing Restrictions: No   Pertinent Vitals/Pain     ADL  Grooming: Performed;Wash/dry hands;Min guard;Other (comment) (Pt had a LOB while standing at sink 2* fatigue.) Where Assessed - Grooming: Supported standing Upper Body Bathing: Simulated;Set up Where Assessed - Upper Body Bathing: Unsupported sitting Lower Body Bathing: Simulated;Moderate assistance Where Assessed - Lower Body Bathing: Supported sit to stand Upper Body Dressing: Simulated;Set up Where Assessed - Upper Body Dressing: Unsupported sitting Lower Body Dressing: Simulated;Maximal assistance Where Assessed - Lower Body Dressing: Supported sit to stand Toilet Transfer: Performed;Minimal assistance Toilet Transfer Method: Sit to Barista: Raised toilet seat with arms (or 3-in-1 over toilet) Toileting - Clothing Manipulation and Hygiene: Performed;Moderate assistance Where Assessed - Toileting Clothing Manipulation and Hygiene: Sit to stand from 3-in-1 or toilet Equipment Used: Gait belt;Rolling walker Transfers/Ambulation  Related to ADLs: Pt was extremely fatigued after walking the length of the hallway and using the bathroom. One LOB while standing at the sink which pt was unable to self correct. ADL Comments: Discussed d/c options with family and pt. Feel st snf would be safest setting. Pt and family agree.    OT Diagnosis: Generalized weakness  OT Problem List: Decreased activity tolerance;Decreased safety awareness;Decreased knowledge of use of DME or AE OT Treatment Interventions: Self-care/ADL training;Therapeutic activities;DME and/or AE instruction;Patient/family education   OT Goals Acute Rehab OT Goals OT Goal Formulation: With patient/family Time For Goal Achievement: 11/06/11 Potential to Achieve Goals: Good ADL Goals Pt Will Perform Grooming: with supervision;Standing at sink (x 3 tasks to improve standing activity tolerance.) ADL Goal: Grooming - Progress: Goal set today Pt Will Perform Lower Body Bathing: with min assist;Sit to stand from chair;Sit to stand from bed ADL Goal: Lower Body Bathing - Progress: Goal set today Pt Will Perform Lower Body Dressing: with min assist;Sit to stand from chair;Sit to stand from bed ADL Goal: Lower Body Dressing - Progress: Goal set today Pt Will Perform Toileting - Clothing Manipulation: with supervision;Standing ADL Goal: Toileting - Clothing Manipulation - Progress: Goal set today Pt Will Perform Toileting - Hygiene: with supervision;Sit to stand from 3-in-1/toilet ADL Goal: Toileting - Hygiene - Progress: Goal set today  Visit Information  Last OT Received On: 10/23/11 Assistance Needed: +2 PT/OT Co-Evaluation/Treatment: Yes    Subjective Data  Subjective: 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 Patient Stated Goal: Not asked.   Prior Functioning  Vision/Perception  Home Living Lives With: Alone Available Help at Discharge: Family;Available PRN/intermittently Type of Home: House Home Access: Stairs to enter Entrance Stairs-Number of  Steps: 12 Entrance Stairs-Rails: Right;Left;Can reach both  Home Layout: Laundry or work area in basement;Two level Alternate Level Stairs-Number of Steps: son does Marketing executive Shower/Tub: Walk-in Soil scientist Toilet: Handicapped height Home Adaptive Equipment: Environmental consultant - rolling;Walker - four wheeled;Bedside commode/3-in-1 Prior Function Level of Independence: Independent Able to Take Stairs?: Yes Driving: Yes Vocation: Retired Musician: No difficulties Dominant Hand: Right      Cognition  Overall Cognitive Status: Appears within functional limits for tasks assessed/performed Arousal/Alertness: Awake/alert Orientation Level: Appears intact for tasks assessed Behavior During Session: [facility] for tasks performed Current Attention Level: Selective    Extremity/Trunk Assessment Right Upper Extremity Assessment RUE ROM/Strength/Tone: [facility] for tasks assessed Left Upper Extremity Assessment LUE ROM/Strength/Tone: WFL for tasks assessed   Mobility Bed Mobility Bed Mobility: Rolling Right;Right Sidelying to Sit;Sitting - Scoot to Edge of Bed Rolling Right: 5: Supervision;With rail Right Sidelying to Sit: 5: Supervision;With rails;HOB flat Sitting - Scoot to Edge of Bed: 5: Supervision Details for Bed Mobility Assistance: cues to get into full SL before sliding legs off bed Transfers Sit to Stand: From chair/3-in-1;From bed;With upper extremity assist;3: Mod assist;4: Min assist Stand to Sit: 3: Mod assist;4: Min assist;To chair/3-in-1 Details for Transfer Assistance: pt able to perform transfers with min A and increased time at beginning of session, with vc's for technique and hand placement.  However, with fatigue after ambulating, pt needing mod A for transfers and feeling very weak.   Exercise    Balance Balance Balance Assessed: Yes Static Standing Balance Static Standing - Balance Support: Bilateral upper extremity supported;During functional activity Static  Standing - Level of Assistance: 3: Mod assist;5: Stand by assistance Static Standing - Comment/# of Minutes: Pt needing only stand by assist until she fatigued and then had an episode of posterior LOB with mod A to correct  End of Session OT - End of Session Equipment Utilized During Treatment: Gait belt Activity Tolerance: Patient limited by fatigue Patient left: in chair;with call bell/phone within reach;with family/visitor present  GO     Mercie Balsley A OTR/L [phone] 10/23/2011, 1:05 PM

## 2011-10-23 NOTE — Progress Notes (Signed)
Physical Therapy Treatment Patient Details Name: Kathryn Schroeder MRN: [id number] DOB: May 30, 1930 Today's Date: 10/23/2011 Time: 8657-8469 PT Time Calculation (min): 48 min  PT Assessment / Plan / Recommendation Comments on Treatment Session  Pt continues to improve but becomes very weak when she fatigues.  Also continues to demonstrate a significant fall risk with gait speed at 0.64ft/sec.  Spoke with pt and family about ST-SNF for rehab before going home to have more supervision before going home and to receive more therapy.  Pt and family agreeable.  Recommendation changed to SNF.  PT will continue to follow.    Follow Up Recommendations  Skilled nursing facility    Barriers to Discharge        Equipment Recommendations  None recommended by PT    Recommendations for Other Services    Frequency Min 3X/week   Plan Discharge plan needs to be updated;Frequency remains appropriate    Precautions / Restrictions Precautions Precautions: Fall Restrictions Weight Bearing Restrictions: No   Pertinent Vitals/Pain 6/10 leg pain, meds requested    Mobility  Bed Mobility Bed Mobility: Rolling Right;Right Sidelying to Sit;Sitting - Scoot to Edge of Bed Rolling Right: 5: Supervision;With rail Right Sidelying to Sit: 5: Supervision;With rails;HOB flat Sitting - Scoot to Edge of Bed: 5: Supervision Details for Bed Mobility Assistance: cues to get into full SL before sliding legs off bed Transfers Transfers: Sit to Stand;Stand to Sit Sit to Stand: From chair/3-in-1;From bed;With upper extremity assist;3: Mod assist;4: Min assist Stand to Sit: 3: Mod assist;4: Min assist;To chair/3-in-1 Details for Transfer Assistance: pt able to perform transfers with min A and increased time at beginning of session, with vc's for technique and hand placement.  However, with fatigue after ambulating, pt needing mod A for transfers and feeling very weak. Ambulation/Gait Ambulation/Gait Assistance: 4: Min  guard Ambulation Distance (Feet): 200 Feet Assistive device: Rolling walker Ambulation/Gait Assistance Details: vc's for maneuvering RW but pt able to do it on her own.  vc's for upright posture.  Encourgament that she needs to use RW for all ambulation at this time. Gait Pattern: Step-to pattern;Step-through pattern;Decreased stance time - left;Trunk flexed Gait velocity: 0.55 ft/sec General Gait Details: began with step-to pattern due to left knee pain, progressed to step-through pattern Stairs: No Wheelchair Mobility Wheelchair Mobility: No    Exercises     PT Diagnosis:    PT Problem List:   PT Treatment Interventions:     PT Goals Acute Rehab PT Goals PT Goal Formulation: With patient Time For Goal Achievement: 11/02/11 Potential to Achieve Goals: Good Pt will go Supine/Side to Sit: with modified independence PT Goal: Supine/Side to Sit - Progress: Progressing toward goal Pt will go Sit to Supine/Side: with modified independence PT Goal: Sit to Supine/Side - Progress: Progressing toward goal Pt will go Sit to Stand: with modified independence PT Goal: Sit to Stand - Progress: Progressing toward goal Pt will go Stand to Sit: with modified independence PT Goal: Stand to Sit - Progress: Progressing toward goal Pt will Transfer Bed to Chair/Chair to Bed: with modified independence PT Transfer Goal: Bed to Chair/Chair to Bed - Progress: Progressing toward goal Pt will Ambulate: 51 - 150 feet;with modified independence;with least restrictive assistive device PT Goal: Ambulate - Progress: Progressing toward goal  Visit Information  Last PT Received On: 10/23/11 Assistance Needed: +2    Subjective Data  Subjective: I went to rehab after my hip replacement Patient Stated Goal: return home   Cognition  Overall  Cognitive Status: Appears within functional limits for tasks assessed/performed Arousal/Alertness: Awake/alert Orientation Level: Appears intact for tasks  assessed Behavior During Session: [facility] for tasks performed Current Attention Level: Selective    Balance  Balance Balance Assessed: Yes Static Standing Balance Static Standing - Balance Support: Bilateral upper extremity supported;During functional activity Static Standing - Level of Assistance: 3: Mod assist;5: Stand by assistance Static Standing - Comment/# of Minutes: Pt needing only stand by assist until she fatigued and then had an episode of posterior LOB with mod A to correct  End of Session PT - End of Session Equipment Utilized During Treatment: Gait belt Activity Tolerance: Patient limited by fatigue Patient left: in chair;with call bell/phone within reach;with family/visitor present Nurse Communication: Mobility status   GP   Lyanne Co, PT  Acute Rehab Services  [phone]   Lyanne Co 10/23/2011, 12:01 PM

## 2011-10-23 NOTE — Progress Notes (Signed)
  Pharmacy Consult for Levaquin, coumadin Indication:Cellulitis, atrial fibrillation  Allergies  Allergen Reactions  . Bactrim (Sulfamethoxazole W-Trimethoprim) Hives  . Adhesive (Tape)     WHITE tape: Causes blisters and skin reaction  . Sulfa Antibiotics     Hyperkalemia with bactrim  . Penicillins Itching and Rash    Childhood allergy:     Patient Measurements: Height: 5\' 7"  (170.2 cm) Weight: 200 lb 6.4 oz (90.9 kg) IBW/kg (Calculated) : 61.6   Vital Signs: Temp: 98.6 F (37 C) (07/17 0549) Temp src: Oral (07/17 0549) BP: 150/81 mmHg (07/17 0549)  Labs:  Basename 10/23/11 0620 10/22/11 1717 10/22/11 0151 10/21/11 0940  HGB 11.7* -- 11.4* --  HCT 34.8* -- 34.1* --  PLT 257 -- 243 --  APTT -- -- -- --  LABPROT 39.3* -- 39.4* 30.6*  INR 3.96* -- 3.98* 2.88*  HEPARINUNFRC -- -- -- --  CREATININE 0.92 0.85 0.77 --  CKTOTAL -- -- -- --  CKMB -- -- -- --  TROPONINI -- -- -- --    Estimated Creatinine Clearance: 57.4 ml/min (by C-G formula based on Cr of 0.92).     Assessment: Patient is a 78 y/o female on chronic Coumadin therapy for history of atrial fibrillation.  Day #7 antibiotic therapy for leg cellulitis. Vancomycin and doxycycline have been discontinued, cefuroxime continued.   Levaquin per protocol has been restarted [received 1 dose 10/17/11].   She is currently not taking po's well, nor any recorded meals/snacks  Goal of Therapy:  Levaquin to broaden coverage-no renal adjustments required.  INR goal 2 - 3 Monitor platelets by anticoagulation protocol: Yes   Plan:  No Coumadin today. With addition of Levaquin today, I expect that the INR will remain above 3 for the time being.  Will continue Levaquin 750 mg IV q 24 hours until patient is eating and taking po's well, then change Levaquin to po--same dose.   Lucille Passy 10/23/2011,8:45 AM

## 2011-10-24 DIAGNOSIS — I872 Venous insufficiency (chronic) (peripheral): Secondary | ICD-10-CM

## 2011-10-24 DIAGNOSIS — L97909 Non-pressure chronic ulcer of unspecified part of unspecified lower leg with unspecified severity: Secondary | ICD-10-CM

## 2011-10-24 LAB — GLUCOSE, CAPILLARY
Glucose-Capillary: 271 mg/dL — ABNORMAL HIGH (ref 70–99)
Glucose-Capillary: 231 mg/dL — ABNORMAL HIGH (ref 70–99)
Glucose-Capillary: 184 mg/dL — ABNORMAL HIGH (ref 70–99)

## 2011-10-24 LAB — CBC
Hemoglobin: 12.1 g/dL (ref 12.0–15.0)
MCH: 30.2 pg (ref 26.0–34.0)
MCHC: 33.4 g/dL (ref 30.0–36.0)
MCV: 90.3 fL (ref 78.0–100.0)
RBC: 4.01 MIL/uL (ref 3.87–5.11)

## 2011-10-24 LAB — HEMOGLOBIN A1C: Hgb A1c MFr Bld: 7.9 % — ABNORMAL HIGH (ref <5.7)

## 2011-10-24 LAB — BASIC METABOLIC PANEL
BUN: 42 mg/dL — ABNORMAL HIGH (ref 6–23)
CO2: 26 mEq/L (ref 19–32)
Calcium: 9.3 mg/dL (ref 8.4–10.5)
Creatinine, Ser: 1 mg/dL (ref 0.50–1.10)
GFR calc non Af Amer: 52 mL/min — ABNORMAL LOW (ref >90)
Glucose, Bld: 277 mg/dL — ABNORMAL HIGH (ref 70–99)

## 2011-10-24 MED ORDER — TRAMADOL HCL 50 MG PO TABS
50.0000 mg | ORAL_TABLET | Freq: Four times a day (QID) | ORAL | Status: DC
  Administered 2011-10-24 – 2011-10-25 (×4): 50 mg via ORAL
  Filled 2011-10-24 (×6): qty 1

## 2011-10-24 MED ORDER — LOSARTAN POTASSIUM 25 MG PO TABS
25.0000 mg | ORAL_TABLET | Freq: Every day | ORAL | Status: DC
  Administered 2011-10-24 – 2011-10-25 (×2): 25 mg via ORAL
  Filled 2011-10-24 (×2): qty 1

## 2011-10-24 MED ORDER — INSULIN GLARGINE 100 UNIT/ML ~~LOC~~ SOLN
15.0000 [IU] | Freq: Every day | SUBCUTANEOUS | Status: DC
  Administered 2011-10-24: 15 [IU] via SUBCUTANEOUS

## 2011-10-24 MED ORDER — DOXYCYCLINE HYCLATE 100 MG PO TABS
100.0000 mg | ORAL_TABLET | Freq: Two times a day (BID) | ORAL | Status: DC
  Administered 2011-10-24 – 2011-10-25 (×3): 100 mg via ORAL
  Filled 2011-10-24 (×4): qty 1

## 2011-10-24 NOTE — Progress Notes (Signed)
[facility] PROGRESS NOTE  Kathryn Schroeder:[id number] DOB: Jun 02, 1930 DOA: 10/17/2011 PCP: Romeo Rabon, MD  Assessment/Plan: Principal Problem:  *Venous stasis ulcers Active Problems:  Chronic anticoagulation  Atrial fibrillation with controlled ventricular response  HTN (hypertension)  Hyperkalemia  Diabetes mellitus type 2, controlled  Cellulitis  Leg pain  Aorto-iliac disease (MILD)  Acute respiratory failure with hypoxia  Interstitial edema  Metabolic encephalopathy   Active Problems:  1. Leg pain/ Bilateral leg ulcer/Aorto-iliac disease (MILD)  This was a large part of patient's presenting problems. Lower strategy arterial Dopplers and ABIs indicate only mild aortic iliac disease. She has also been evaluated by Dr Edilia Bo, vascular surgeon this admission and at this time arteriography and revascularization are not indicated, unless she worsens or fails to show improvement with aggressive wound care, elevation and compression. Furthermore, patient would be at increased risk for surgery given her age and her history of coronary artery disease. Continued on Aspirin. Pain control has been achieved with analgesics. 2. Acute respiratory failure with hypoxia/Interstitial edema: Patient decompensated in early AM of 10/22/11, with dyspnea, respiratory distress and wheeze, against a background of CXR 10/21/11, indicating shallow inspiration with elevation of right hemidiaphragm. Increasing interstitial changes. Patient was treated with diuretics on suspicion of probable volume overload, diuresed almost 1500 cc and respiratory status improved markedly. CTA of 10/22/11 showed No evidence of significant pulmonary embolus. Small bilateral pleural effusions with atelectasis or infiltration in the lung bases. 2-D echocardiogram showed normal LV size and systolic function, EF 55-60%. Moderate diastolic dysfunction. Mild to moderate AS (mild by mean gradient, moderate by appearance).  Severe MAC and severely calcified anterior leaflet of the mitral valve without significant stenosis. Normal RV size and systolic function. Moderate pulmonary hypertension. Solu-Medrol was discontinued on 10/22/11. As of 10/23/11, patient had no evidence of respiratory distress. Levaquin was started on 10/22/11, for "Chest Ct findings", but as patient has no productive cough or pyrexia, and there was no definitive focal consolidation, this has been discontinued.  3. Metabolic encephalopathy:   Transient confusion, likely related to acute problems and narcotic medication. Today, mental status is at baseline. Pre-admission opioid analgesics have been discontinued in favor of Tylenol/Ultram. 4. Atrial fibrillation with controlled ventricular response: Rate remains controlled. Other wise stable. Anticoagulation is being managed by clinical pharmacologist.  5. Hyperkalemia: Etiology unclear, although patient did indicate that she was on oral Potassium at some point, when she was on oral diuretics. Apparently, this has been discontinued. Following lytes, and hyperkalemia has resolved.  6. Lower extremity wounds/Cellulitis:   Patient presented with bilateral lower leg pain and ulcers and had been receiving treatment for the last 2 and half months for lesions on her left leg, without improvement. The right lower extremity lesion developed approximately 2 weeks ago. Since hospitalization, she has been managed with cephalosporins, Vancomycin and briefly, Doxycycline. Wound cultures grew Klebsiella Pneumoniae and MRSA. Given the multiplicity of antibiotic changes, we consulted Dr Cliffton Asters, infectious diseases specialist, who has recommended a 2 -week course of Doxycycline and Keflex. This will be concluded on 11/06/11.  7. Chronic anticoagulation  INR crept up to a high of 3.98 during this hospitalization, necessitating holding Coumadin temporarily. Dosing is being managed by pharmacist. 8. HTN (hypertension)    Blood pressure was sub-optimal controlled, on Carvedilol alone. Losartan was reinstated on 10/24/11. 9. Diabetes mellitus type 2, controlled:   Per patient DM was well controlled pre-admission, and she has even had a few hypoglycemic episodes. Pre-admission, she was on Metformin. HBA1C is  7.9. CBGs are somewhat elevated, so she is being managed with SSI/Lantus, adjusted as indicated. .   Code Status: Full Family Communication:  Disposition Plan: For SNF, on 10/25/11, if stable.    Brief narrative: 78 year old woman who presented to the emergency department with bilateral lower leg pain and ulcers. Symptoms began to half months ago when she developed a small ulcer on the back of her left calf. This was thought to be a spider bite and was treated with antibiotic. She was hospitalized shortly thereafter with severe hyperkalemia thought to be secondary to this antibiotic (Septra?). She reports subsequently she has been on other antibiotics, which she cannot recall. Her primary care physician referred her to the wound care center where she has been receiving treatment for the last 2 and half months.  Despite wound care treatment her left leg continued to worsen in regard to ulceration and pain. Approximately 2 weeks ago she developed a similar ulcer on her right leg and increased pain in this leg as well. She has had intermittent swelling over the last 2 and half months and intermittent erythema of the lower legs. She was recently placed in graduated hose and referred to infectious disease physician who subsequently recommended she seek out hospital care. She denied any injury and has no previous history of leg problems. The pain in her leg was constant and severe in nature and was worse with ambulation.  Consultants: Jaynie Collins, M.D./vascular surgery   Procedures: Lower extremity arterial Dopplers and ABIs. 2D Echocardiogram.    Antibiotics: See discussion.  HPI/Subjective: No new issues.    Objective: Filed Vitals:   10/23/11 2104 10/24/11 0520 10/24/11 0949 10/24/11 1400  BP: 172/95 183/79 147/94 167/85  Pulse: 73 68 73 70  Temp: 97.6 F (36.4 C) 97.7 F (36.5 C) 97.9 F (36.6 C) 97.7 F (36.5 C)  TempSrc: Oral Oral Oral Oral  Resp: 18 18 18 18   Height:      Weight: 91 kg (200 lb 9.9 oz)     SpO2: 92% 91% 96% 96%    Intake/Output Summary (Last 24 hours) at 10/24/11 1513 Last data filed at 10/24/11 1500  Gross per 24 hour  Intake    680 ml  Output   1400 ml  Net   -720 ml    Exam:  General: Comfortable, alert, communicative, fully oriented, not short of breath at rest.  HEENT:  Mild clinical pallor, no jaundice, no conjunctival injection or discharge. Hydration appears fair.  NECK:  Supple, JVP not seen, no carotid bruits, no palpable lymphadenopathy, no palpable goiter. CHEST:  Clinically clear to auscultation, no wheezes, no crackles. HEART:  Sounds 1 and 2 heard, normal, irregular, no murmurs. ABDOMEN:  Moderately obese, soft, non-tender, no palpable organomegaly, no palpable masses, normal bowel sounds. GENITALIA:  Not examined. LOWER EXTREMITIES:  Bilateral ACE wraps. . MUSCULOSKELETAL SYSTEM:  Generalized osteoarthritic changes, otherwise, normal. CENTRAL NERVOUS SYSTEM:  No focal neurologic deficit on gross examination.  Data Reviewed: Basic Metabolic Panel:  Lab 10/24/11 1610 10/23/11 0620 10/22/11 1717 10/22/11 0151 10/18/11 0535  NA 134* 135 134* 135 134*  K 4.8 5.1 4.7 5.2* 4.5  CL 97 98 98 101 99  CO2 26 23 23 23 26   GLUCOSE 277* 293* 235* 148* 174*  BUN 42* 37* 30* 23 35*  CREATININE 1.00 0.92 0.85 0.77 1.34*  CALCIUM 9.3 9.3 9.2 9.0 9.3  MG -- -- -- -- --  PHOS -- -- -- -- --   Liver Function  Tests:  Lab 10/17/11 1624  AST 18  ALT 9  ALKPHOS 79  BILITOT 0.9  PROT 8.2  ALBUMIN 3.6   No results found for this basename: LIPASE:5,AMYLASE:5 in the last 168 hours No results found for this basename: AMMONIA:5 in the last 168  hours CBC:  Lab 10/24/11 0605 10/23/11 0620 10/22/11 0151 10/18/11 0535 10/17/11 1624  WBC 12.5* 11.5* 13.2* 11.6* 12.7*  NEUTROABS -- -- 10.2* -- 9.8*  HGB 12.1 11.7* 11.4* 11.7* 12.6  HCT 36.2 34.8* 34.1* 35.5* 37.8  MCV 90.3 91.1 92.2 94.2 93.3  PLT 265 257 243 205 228   Cardiac Enzymes: No results found for this basename: CKTOTAL:5,CKMB:5,CKMBINDEX:5,TROPONINI:5 in the last 168 hours BNP (last 3 results)  Basename 10/22/11 0341  PROBNP 5797.0*   CBG:  Lab 10/24/11 1142 10/24/11 0738 10/23/11 2114 10/23/11 1645 10/23/11 1150  GLUCAP 231* 271* 210* 309* 383*    Recent Results (from the past 240 hour(s))  URINE CULTURE     Status: Normal   Collection Time   10/17/11  3:32 PM      Component Value Range Status Comment   Specimen Description URINE, CLEAN CATCH   Final    Special Requests ADDED 10/17/11 1707   Final    Culture  Setup Time 10/17/2011 17:45   Final    Colony Count 15,000 COLONIES/ML   Final    Culture     Final    Value: Multiple bacterial morphotypes present, none predominant. Suggest appropriate recollection if clinically indicated.   Report Status 10/19/2011 FINAL   Final   WOUND CULTURE     Status: Normal   Collection Time   10/18/11 10:17 AM      Component Value Range Status Comment   Specimen Description WOUND LEFT LEG   Final    Special Requests Normal   Final    Gram Stain     Final    Value: NO WBC SEEN     NO SQUAMOUS EPITHELIAL CELLS SEEN     ABUNDANT GRAM POSITIVE COCCI IN PAIRS     ABUNDANT GRAM NEGATIVE COCCOBACILLI   Culture     Final    Value: ABUNDANT METHICILLIN RESISTANT STAPHYLOCOCCUS AUREUS     Note: RIFAMPIN AND GENTAMICIN SHOULD NOT BE USED AS SINGLE DRUGS FOR TREATMENT OF STAPH INFECTIONS. This organism DOES NOT demonstrate inducible Clindamycin resistance in vitro. CRITICAL RESULT CALLED TO, READ BACK BY AND VERIFIED WITH: [email or web address]       ON [id number] BY [facility]     FEW KLEBSIELLA PNEUMONIAE   Report Status 10/20/2011 FINAL    Final    Organism ID, Bacteria METHICILLIN RESISTANT STAPHYLOCOCCUS AUREUS   Final    Organism ID, Bacteria KLEBSIELLA PNEUMONIAE   Final      Studies: Ct Angio Chest W/cm &/or Wo Cm  10/22/2011  *RADIOLOGY REPORT*  Clinical Data: Shortness of breath.  Chest pain. Tachypnea.  CT ANGIOGRAPHY CHEST  Technique:  Multidetector CT imaging of the chest using the standard protocol during bolus administration of intravenous contrast. Multiplanar reconstructed images including MIPs were obtained and reviewed to evaluate the vascular anatomy.  Contrast: OMNIPAQUE IOHEXOL 350 MG/ML SOLN  Comparison: None.  Findings: Technically adequate study with good opacification of the central and segmental pulmonary arteries.  No focal filling defects.  No evidence of significant pulmonary embolus.  Normal heart size.  Coronary artery calcification.  Normal caliber thoracic aorta with calcification.  No significant lymphadenopathy in the chest.  Esophagus is decompressed.  Diffuse enlargement of the thyroid gland with heterogeneous parenchymal pattern and multiple calcifications.  Changes most likely represent multinodular goiter.  Visualized portions of the upper abdominal organs demonstrate cholelithiasis and extensive vascular calcification.  Visualization of the lungs is limited due to respiratory motion artifact.  There are small bilateral pleural effusions with basilar atelectasis or infiltration bilaterally.  These changes are more prominent on the right.  The airways appear patent.  No pneumothorax.  Degenerative changes in the thoracic spine.  IMPRESSION: No evidence of significant pulmonary embolus.  Small bilateral pleural effusions with atelectasis or infiltration in the lung bases.  Diffuse enlargement of the thyroid gland with multiple nodules and multiple calcifications.  Original Report Authenticated By: Marlon Pel, M.D.   Dg Chest Port 1 View  10/21/2011  *RADIOLOGY REPORT*  Clinical Data:  Increasing wheezing and respiratory distress.  PORTABLE CHEST - 1 VIEW  Comparison: 10/19/2011  Findings: Shallow inspiration with elevation of the right hemidiaphragm.  Heart size and pulmonary vascularity are normal. Increasing interstitial changes may represent developing interstitial edema, bronchitis, or pneumonia.  No blunting of costophrenic angles.  No pneumothorax.  Calcification of the aorta. Degenerative changes in the spine and shoulders.  IMPRESSION: Shallow inspiration with elevation of right hemidiaphragm. Increasing interstitial changes as described.  Original Report Authenticated By: Marlon Pel, M.D.   Dg Chest Port 1 View  10/19/2011  *RADIOLOGY REPORT*  Clinical Data: Hypoxia.  PORTABLE CHEST - 1 VIEW  Comparison: 08/21/2011 and 08/07/2004  Findings: Again noted is prominent paratracheal soft tissue which appears chronic and may be vascular in etiology.  There is stable elevation of the right hemidiaphragm.  Few densities at the lung bases are suggestive for atelectasis.  The heart size is grossly stable.  Negative for a pneumothorax.  IMPRESSION: Mild basilar atelectasis without focal chest disease.  Prominent paratracheal soft tissue appears stable.  This may be related to vascular structures.  Original Report Authenticated By: Richarda Overlie, M.D.    Scheduled Meds:    . aspirin  81 mg Oral Daily  . brimonidine  1 drop Both Eyes BID   And  . timolol  1 drop Both Eyes BID  . carvedilol  12.5 mg Oral BID WC  . cefUROXime  500 mg Oral BID WC  . doxycycline  100 mg Oral Q12H  . insulin aspart  0-15 Units Subcutaneous TID WC  . insulin glargine  15 Units Subcutaneous QHS  . magnesium oxide  400 mg Oral BID  . sodium chloride  3 mL Intravenous Q12H  . traMADol  50 mg Oral QID  . Warfarin - Pharmacist Dosing Inpatient   Does not apply q1800  . DISCONTD: insulin aspart  0-9 Units Subcutaneous TID WC  . DISCONTD: insulin glargine  10 Units Subcutaneous QHS  . DISCONTD:  levofloxacin  750 mg Intravenous Q24H   Continuous Infusions:    . sodium chloride 75 mL/hr at 10/21/11 1610    Principal Problem:  *Venous stasis ulcers Active Problems:  Chronic anticoagulation  Atrial fibrillation with controlled ventricular response  HTN (hypertension)  Hyperkalemia  Diabetes mellitus type 2, controlled  Cellulitis  Leg pain  Aorto-iliac disease (MILD)  Acute respiratory failure with hypoxia  Interstitial edema  Metabolic encephalopathy     Baxter Gonzalez,CHRISTOPHER  [facility] Pager [phone]. If 8PM-8AM, please contact night-coverage at [email or web address], password [facility] 10/24/2011, 3:13 PM  LOS: 7 days

## 2011-10-24 NOTE — Consult Note (Signed)
Wound care follow-up:  Legs with slough beginning to soften to leg wounds.  Unchanged in measurements. Pt states she is in severe leg pain all the time, not just with dressing changes.  This is consistent with arterial disease.  Right leg with patchy areas of wounds 5X5cm.  60% red, 40% slough. Left leg 10X7cm and 7X5cm, both with 60% red, 40% slough after debridement of nonviable tissue.  Mod thick tan drainage, strong odor.  Pt only able to tolerate minimal amt of conservative debridement related to pain and would not be able to tolerate hydrotherapy.  Excisional conservative sharp debridement performed using scissors and forceps to left and right legs.  Able to remove mod amt nonviable tissue without bleeding. Plan:  Continue silver wound gel to asist with antimicrobial benefits and soften nonviable tissue.  VVS has ordered light compression with Ace wraps.  Infectious disease scheduled to assess legs later today.  Bedside nurse will reapply ace wraps at that time.  Foam dressings in place to protect wound bed over hydrogel application and minimize discomfort.Son at bedside to assess wounds and discuss plan of care.  Pt plans to be followed as outpatient at wound center after discharge and by Dr Edilia Bo, also, according to son.  She can resume previous plan of care with Santyl ointment after discharge to assist with chemical debridement.  Cammie Mcgee, RN, MSN, [employer]  [phone]

## 2011-10-24 NOTE — Discharge Summary (Signed)
Physician Discharge Summary  ARLYNE BRANDES ZOX:[id number] DOB: Dec 28, 1930 DOA: 10/17/2011  PCP: Romeo Rabon, MD  Admit date: 10/17/2011 Discharge date: 10/25/2011  Recommendations for Outpatient Follow-up:  Follow up with primary MD and with SNF MD.   Discharge Diagnoses:  Principal Problem:  *Venous stasis ulcers Active Problems:  Chronic anticoagulation  Atrial fibrillation with controlled ventricular response  HTN (hypertension)  Hyperkalemia  Diabetes mellitus type 2, controlled  Cellulitis  Leg pain  Aorto-iliac disease (MILD)  Acute respiratory failure with hypoxia  Interstitial edema  Metabolic encephalopathy  Discharge Condition: Satisfactory.  Diet recommendation: Carbohydrate-modified.  History of present illness:  78 year old woman who presented to the emergency department with bilateral lower leg pain and ulcers. Symptoms began to half months ago when she developed a small ulcer on the back of her left calf. This was thought to be a spider bite and was treated with antibiotic. She was hospitalized shortly thereafter with severe hyperkalemia thought to be secondary to this antibiotic (Septra?). She reports subsequently she has been on other antibiotics, which she cannot recall. Her primary care physician referred her to the wound care center where she has been receiving treatment for the last 2 and half months.  Despite wound care treatment her left leg continued to worsen in regard to ulceration and pain. Approximately 2 weeks ago she developed a similar ulcer on her right leg and increased pain in this leg as well. She has had intermittent swelling over the last 2 and half months and intermittent erythema of the lower legs. She was recently placed in graduated hose and referred to infectious disease physician who subsequently recommended she seek out hospital care. She denied any injury and has no previous history of leg problems. The pain in her leg was constant  and severe in nature and was worse with ambulation.  Hospital Course:  Active Problems:  1. Leg pain/ Bilateral leg ulcer/Aorto-iliac disease (MILD)  This was a large part of patient's presenting problems. Lower extremity arterial Dopplers and ABIs indicated only mild aortic iliac disease. She has also been evaluated by Dr Edilia Bo, vascular surgeon this admission and at this time arteriography and revascularization are not indicated, unless she worsens or fails to show improvement with aggressive wound care, elevation and compression. Furthermore, patient would be at increased risk for surgery given her age and her history of coronary artery disease. She ws continued on Aspirin. Pain control was achieved with analgesics.  2. Acute respiratory failure with hypoxia/Interstitial edema:  Patient decompensated in early AM of 10/22/11, with dyspnea, respiratory distress and wheeze, against a background of CXR 10/21/11, indicating shallow inspiration with elevation of right hemidiaphragm. Increasing interstitial changes. Patient was treated with diuretics on suspicion of probable volume overload, diuresed almost 1500 cc and respiratory status improved markedly. CTA of 10/22/11 showed no evidence of significant pulmonary embolus. Small bilateral pleural effusions with atelectasis or infiltration in the lung bases. 2-D echocardiogram showed normal LV size and systolic function, EF 55-60%. Moderate diastolic dysfunction. Mild to moderate AS (mild by mean gradient, moderate by appearance). Severe MAC and severely calcified anterior leaflet of the mitral valve without significant stenosis. Normal RV size and systolic function. Moderate pulmonary hypertension. Solu-Medrol was discontinued on 10/22/11. As of 10/23/11, patient had no evidence of respiratory distress. Levaquin was started on 10/22/11, for "Chest Ct findings", but as patient has no productive cough or pyrexia, and there was no definitive focal consolidation, this  has been discontinued.  3. Metabolic encephalopathy:  Transient confusion, likely  related to acute problems and narcotic medication. Today, mental status is at baseline. Pre-admission opioid analgesics have been discontinued in favor of Tylenol/Ultram.  4. Atrial fibrillation with controlled ventricular response:  Rate remains controlled. Other wise stable. Anticoagulation was managed by clinical pharmacologist.  5. Hyperkalemia:  Etiology unclear, although patient did indicate that she was on oral Potassium at some point, when she was on oral diuretics. Apparently, this has been discontinued. We have since followed her electrolytes, and hyperkalemia has resolved.  6. Lower extremity wounds/Cellulitis:  Patient presented with bilateral lower leg pain and ulcers and had been receiving treatment for the last 2 and half months for lesions on her left leg, without improvement. The right lower extremity lesion developed approximately 2 weeks ago. During this hospitalization, she was managed with cephalosporins, Vancomycin and briefly, Doxycycline. Wound cultures grew Klebsiella Pneumoniae and MRSA. Given the multiplicity of antibiotic changes, we consulted Dr Cliffton Asters, infectious diseases specialist, who has recommended a 2 -week course of Doxycycline and Keflex. This will be concluded on 11/06/11.  7. Chronic anticoagulation  INR crept up to a high of 3.98 during this hospitalization, necessitating holding Coumadin temporarily. Dosing is being managed by pharmacist.  8. HTN (hypertension)  Blood pressure was sub-optimal controlled, on Carvedilol alone. Losartan was reinstated on 10/24/11, with satisfactory response. .  9. Diabetes mellitus type 2, controlled:  Per patient DM was well controlled pre-admission, and she even had a few hypoglycemic episodes. Pre-admission, she was on Metformin. HBA1C is 7.9. CBGs remained somewhat elevated, so she is being managed with SSI/Lantus, adjusted as indicated.     Procedures:  CXR/CTA.  Consultations:  Dr Waverly Ferrari, vascular surgeon.  Dr Cliffton Asters, infectious diseases specialist.  Discharge Exam: Filed Vitals:   10/25/11 1012  BP: 121/79  Pulse: 68  Temp: 97.7 F (36.5 C)  Resp: 18   Filed Vitals:   10/24/11 2158 10/24/11 2217 10/25/11 0502 10/25/11 1012  BP:  164/76 151/84 121/79  Pulse: 65  66 68  Temp: 98.1 F (36.7 C)  97.7 F (36.5 C) 97.7 F (36.5 C)  TempSrc: Oral  Oral Oral  Resp: 18  18 18   Height:      Weight:      SpO2: 97%  95% 92%   General: Comfortable, alert, communicative, fully oriented, not short of breath at rest.  HEENT: Mild clinical pallor, no jaundice, no conjunctival injection or discharge. Hydration appears fair.  NECK: Supple, JVP not seen, no carotid bruits, no palpable lymphadenopathy, no palpable goiter.  CHEST: Clinically clear to auscultation, no wheezes, no crackles.  HEART: Sounds 1 and 2 heard, normal, irregular, no murmurs.  ABDOMEN: Moderately obese, soft, non-tender, no palpable organomegaly, no palpable masses, normal bowel sounds.  GENITALIA: Not examined.  LOWER EXTREMITIES: Bilateral ACE wraps. .  MUSCULOSKELETAL SYSTEM: Generalized osteoarthritic changes, otherwise, normal.  CENTRAL NERVOUS SYSTEM: No focal neurologic deficit on gross examination.  Discharge Instructions  Discharge Orders    Future Orders Please Complete By Expires   Diet - low sodium heart healthy      Diet Carb Modified      Increase activity slowly        Medication List  As of 10/25/2011  1:06 PM   STOP taking these medications         HYDROcodone-acetaminophen 5-325 MG per tablet      metFORMIN 500 MG tablet      RED YEAST RICE PO         TAKE  these medications         acetaminophen 325 MG tablet   Commonly known as: TYLENOL   Take 2 tablets (650 mg total) by mouth every 6 (six) hours as needed (or Fever >/= 101).      aspirin 81 MG tablet   Take 81 mg by mouth every  morning.      bisacodyl 5 MG EC tablet   Commonly known as: DULCOLAX   Take 1 tablet (5 mg total) by mouth daily as needed.      carvedilol 12.5 MG tablet   Commonly known as: COREG   Take 12.5 mg by mouth 2 (two) times daily with a meal.      cefUROXime 500 MG tablet   Commonly known as: CEFTIN   Take 1 tablet (500 mg total) by mouth 2 (two) times daily with a meal.      COMBIGAN 0.2-0.5 % ophthalmic solution   Generic drug: brimonidine-timolol   Place 1 drop into both eyes 2 (two) times daily.      doxycycline 100 MG tablet   Commonly known as: VIBRA-TABS   Take 1 tablet (100 mg total) by mouth every 12 (twelve) hours.      Fish Oil 1000 MG Caps   Take 1 capsule by mouth every morning.      insulin aspart 100 UNIT/ML injection   Commonly known as: novoLOG   For CBG 70-120, no Insulin; CBG 121-150, 2 units; CBG 151-200, 3 units; CBG 201-250, 5 units; CBG 251-300, 8 units; CBG 301-350, 11 units; CBG 351-400, 15 units.      insulin glargine 100 UNIT/ML injection   Commonly known as: LANTUS   Inject 15 Units into the skin at bedtime.      losartan 100 MG tablet   Commonly known as: COZAAR   Take 25 mg by mouth daily.      magnesium oxide 400 MG tablet   Commonly known as: MAG-OX   Take 400 mg by mouth 2 (two) times daily.      metoCLOPramide 5 MG tablet   Commonly known as: REGLAN   Take 1 tablet (5 mg total) by mouth 4 (four) times daily -  before meals and at bedtime.      senna-docusate 8.6-50 MG per tablet   Commonly known as: Senokot-S   Take 1 tablet by mouth daily.      traMADol 50 MG tablet   Commonly known as: ULTRAM   Take 1 tablet (50 mg total) by mouth 4 (four) times daily.      Vitamin D3 400 UNITS Caps   Take by mouth every morning.      warfarin 5 MG tablet   Commonly known as: COUMADIN   Take 0.5-1 tablets (2.5-5 mg total) by mouth daily. Patient takes 5 mg daily except 2.5 mg on Tuesday's and Saturday's           Follow-up Information     Follow up with Romeo Rabon, MD. (As needed)    Contact information:   [address] [place] [address] [phone]       Please follow up. (SNF MD)           The results of significant diagnostics from this hospitalization (including imaging, microbiology, ancillary and laboratory) are listed below for reference.    Significant Diagnostic Studies: Ct Angio Chest W/cm &/or Wo Cm  10/22/2011  *RADIOLOGY REPORT*  Clinical Data: Shortness of breath.  Chest pain. Tachypnea.  CT ANGIOGRAPHY CHEST  Technique:  Multidetector CT imaging of the chest using the standard protocol during bolus administration of intravenous contrast. Multiplanar reconstructed images including MIPs were obtained and reviewed to evaluate the vascular anatomy.  Contrast: OMNIPAQUE IOHEXOL 350 MG/ML SOLN  Comparison: None.  Findings: Technically adequate study with good opacification of the central and segmental pulmonary arteries.  No focal filling defects.  No evidence of significant pulmonary embolus.  Normal heart size.  Coronary artery calcification.  Normal caliber thoracic aorta with calcification.  No significant lymphadenopathy in the chest.  Esophagus is decompressed.  Diffuse enlargement of the thyroid gland with heterogeneous parenchymal pattern and multiple calcifications.  Changes most likely represent multinodular goiter.  Visualized portions of the upper abdominal organs demonstrate cholelithiasis and extensive vascular calcification.  Visualization of the lungs is limited due to respiratory motion artifact.  There are small bilateral pleural effusions with basilar atelectasis or infiltration bilaterally.  These changes are more prominent on the right.  The airways appear patent.  No pneumothorax.  Degenerative changes in the thoracic spine.  IMPRESSION: No evidence of significant pulmonary embolus.  Small bilateral pleural effusions with atelectasis or infiltration in the lung bases.   Diffuse enlargement of the thyroid gland with multiple nodules and multiple calcifications.  Original Report Authenticated By: Marlon Pel, M.D.   Dg Chest Port 1 View  10/21/2011  *RADIOLOGY REPORT*  Clinical Data: Increasing wheezing and respiratory distress.  PORTABLE CHEST - 1 VIEW  Comparison: 10/19/2011  Findings: Shallow inspiration with elevation of the right hemidiaphragm.  Heart size and pulmonary vascularity are normal. Increasing interstitial changes may represent developing interstitial edema, bronchitis, or pneumonia.  No blunting of costophrenic angles.  No pneumothorax.  Calcification of the aorta. Degenerative changes in the spine and shoulders.  IMPRESSION: Shallow inspiration with elevation of right hemidiaphragm. Increasing interstitial changes as described.  Original Report Authenticated By: Marlon Pel, M.D.   Dg Chest Port 1 View  10/19/2011  *RADIOLOGY REPORT*  Clinical Data: Hypoxia.  PORTABLE CHEST - 1 VIEW  Comparison: 08/21/2011 and 08/07/2004  Findings: Again noted is prominent paratracheal soft tissue which appears chronic and may be vascular in etiology.  There is stable elevation of the right hemidiaphragm.  Few densities at the lung bases are suggestive for atelectasis.  The heart size is grossly stable.  Negative for a pneumothorax.  IMPRESSION: Mild basilar atelectasis without focal chest disease.  Prominent paratracheal soft tissue appears stable.  This may be related to vascular structures.  Original Report Authenticated By: Richarda Overlie, M.D.    Microbiology: Recent Results (from the past 240 hour(s))  URINE CULTURE     Status: Normal   Collection Time   10/17/11  3:32 PM      Component Value Range Status Comment   Specimen Description URINE, CLEAN CATCH   Final    Special Requests ADDED 10/17/11 1707   Final    Culture  Setup Time 10/17/2011 17:45   Final    Colony Count 15,000 COLONIES/ML   Final    Culture     Final    Value: Multiple bacterial  morphotypes present, none predominant. Suggest appropriate recollection if clinically indicated.   Report Status 10/19/2011 FINAL   Final   WOUND CULTURE     Status: Normal   Collection Time   10/18/11 10:17 AM      Component Value Range Status Comment   Specimen Description WOUND LEFT LEG   Final    Special  Requests Normal   Final    Gram Stain     Final    Value: NO WBC SEEN     NO SQUAMOUS EPITHELIAL CELLS SEEN     ABUNDANT GRAM POSITIVE COCCI IN PAIRS     ABUNDANT GRAM NEGATIVE COCCOBACILLI   Culture     Final    Value: ABUNDANT METHICILLIN RESISTANT STAPHYLOCOCCUS AUREUS     Note: RIFAMPIN AND GENTAMICIN SHOULD NOT BE USED AS SINGLE DRUGS FOR TREATMENT OF STAPH INFECTIONS. This organism DOES NOT demonstrate inducible Clindamycin resistance in vitro. CRITICAL RESULT CALLED TO, READ BACK BY AND VERIFIED WITH: PEGGY ELLER@[email or web address]       ON [id number] BY [facility]     FEW KLEBSIELLA PNEUMONIAE   Report Status 10/20/2011 FINAL   Final    Organism ID, Bacteria METHICILLIN RESISTANT STAPHYLOCOCCUS AUREUS   Final    Organism ID, Bacteria KLEBSIELLA PNEUMONIAE   Final      Labs: Basic Metabolic Panel:  Lab 10/25/11 9147 10/24/11 0605 10/23/11 0620 10/22/11 1717 10/22/11 0151  NA 136 134* 135 134* 135  K 3.9 4.8 5.1 4.7 5.2*  CL 99 97 98 98 101  CO2 29 26 23 23 23   GLUCOSE 101* 277* 293* 235* 148*  BUN 36* 42* 37* 30* 23  CREATININE 1.08 1.00 0.92 0.85 0.77  CALCIUM 9.0 9.3 9.3 9.2 9.0  MG -- -- -- -- --  PHOS -- -- -- -- --   Liver Function Tests: No results found for this basename: AST:5,ALT:5,ALKPHOS:5,BILITOT:5,PROT:5,ALBUMIN:5 in the last 168 hours No results found for this basename: LIPASE:5,AMYLASE:5 in the last 168 hours No results found for this basename: AMMONIA:5 in the last 168 hours CBC:  Lab 10/25/11 0635 10/24/11 0605 10/23/11 0620 10/22/11 0151  WBC 11.9* 12.5* 11.5* 13.2*  NEUTROABS -- -- -- 10.2*  HGB 12.4 12.1 11.7* 11.4*  HCT 37.4 36.2 34.8* 34.1*  MCV 90.8 90.3 91.1  92.2  PLT 262 265 257 243   Cardiac Enzymes: No results found for this basename: CKTOTAL:5,CKMB:5,CKMBINDEX:5,TROPONINI:5 in the last 168 hours BNP: BNP (last 3 results)  Basename 10/22/11 0341  PROBNP 5797.0*   CBG:  Lab 10/25/11 1133 10/25/11 0757 10/24/11 2156 10/24/11 1709 10/24/11 1142  GLUCAP 123* 103* 150* 184* 231*    Time coordinating discharge: 1 hour.  Signed:  Beck Cofer,CHRISTOPHER  [facility] 10/25/2011, 1:06 PM

## 2011-10-24 NOTE — Progress Notes (Signed)
Anticoagulation Consult Note - Follow up Pharmacy Consult for Levaquin, coumadin Indication:Cellulitis, atrial fibrillation  Allergies  Allergen Reactions  . Bactrim (Sulfamethoxazole W-Trimethoprim) Hives  . Adhesive (Tape)     WHITE tape: Causes blisters and skin reaction  . Sulfa Antibiotics     Hyperkalemia with bactrim  . Penicillins Itching and Rash    Childhood allergy:     Patient Measurements: Height: 5\' 7"  (170.2 cm) Weight: 200 lb 9.9 oz (91 kg) IBW/kg (Calculated) : 61.6   Vital Signs: Temp: 97.7 F (36.5 C) (07/18 0520) Temp src: Oral (07/18 0520) BP: 183/79 mmHg (07/18 0520) Pulse Rate: 68  (07/18 0520)  Labs:  Basename 10/24/11 0605 10/23/11 0620 10/22/11 1717 10/22/11 0151  HGB 12.1 11.7* -- --  HCT 36.2 34.8* -- 34.1*  PLT 265 257 -- 243  APTT -- -- -- --  LABPROT 37.4* 39.3* -- 39.4*  INR 3.72* 3.96* -- 3.98*  HEPARINUNFRC -- -- -- --  CREATININE 1.00 0.92 0.85 --  CKTOTAL -- -- -- --  CKMB -- -- -- --  TROPONINI -- -- -- --    Estimated Creatinine Clearance: 52.9 ml/min (by C-G formula based on Cr of 1).     Assessment: Patient is a 78 yo female on chronic Coumadin therapy for history of atrial fibrillation. INR today remains supratherapeutic at 3.72. PTA dose of 5 mg daily except 2.5 mg on Tues and Sat. CBC stable from yesterday.  Day #8 antibiotic therapy for leg cellulitis currently on levaquin and cefuroxime.   Goal of Therapy:  INR goal 2 - 3 Monitor platelets by anticoagulation protocol: Yes   Plan:  No Coumadin today. Follow up daily INR  Concha Norway 10/24/2011,8:58 AM

## 2011-10-24 NOTE — Consult Note (Signed)
[facility]  Total days of antibiotics 8        Day 4 cefuroxime        Day 3 levofloxacin               Reason for Consult: Infected venous stasis ulcers    Referring Physician: Dr. Ricke Hey  Principal Problem:  *Venous stasis ulcers Active Problems:  Cellulitis  Chronic anticoagulation  Atrial fibrillation with controlled ventricular response  HTN (hypertension)  Hyperkalemia  Diabetes mellitus type 2, controlled  Leg pain  Aorto-iliac disease (MILD)  Acute respiratory failure with hypoxia  Interstitial edema  Metabolic encephalopathy      . aspirin  81 mg Oral Daily  . brimonidine  1 drop Both Eyes BID   And  . timolol  1 drop Both Eyes BID  . carvedilol  12.5 mg Oral BID WC  . cefUROXime  500 mg Oral BID WC  . insulin aspart  0-15 Units Subcutaneous TID WC  . insulin glargine  10 Units Subcutaneous QHS  . levofloxacin  750 mg Intravenous Q24H  . magnesium oxide  400 mg Oral BID  . sodium chloride  3 mL Intravenous Q12H  . Warfarin - Pharmacist Dosing Inpatient   Does not apply q1800  . DISCONTD: insulin aspart  0-9 Units Subcutaneous TID WC    Recommendations: 1. Continue cefuroxime for 6 more days 2. Change levofloxacin to doxycycline and treat for 6 more days   Assessment: Kathryn Schroeder has superficial infection complicating her venous stasis ulcers. Swab cultures of her left leg ulcer have grown MRSA and Klebsiella. The MRSA is intermediately resistant to levofloxacin. I will change levofloxacin to doxycycline and continue the cefuroxime. She has improved and I think a total of 14 days of therapy should suffice. I agree with continuing local wound care and Ace wraps. Please call if I can be of further assistance while here.    HPI: Kathryn Schroeder is a 78 y.o. female who has chronic venous insufficiency and developed a left leg ulcer about 2-1/2 months ago. She was being followed by doctors in [place] and was  placed on trimethoprim sulfamethoxazole. She developed severe hyperkalemia requiring admission to the hospital for 3 days. She was then placed on clindamycin in late May. This was switched to ciprofloxacin on June 3. She took it for one week before developing a diffuse pruritic rash. All antibiotics were stopped at that time and she was referred to the wound care center in [place]. From there she was referred to infectious disease physician (she does not recall his name) in [place] who told her that she needed to go immediately to the hospital and be admitted for surgery. Her family brought her here and she was admitted on July 11. She still having quite a bit of leg pain but is feeling better.   Review of Systems: Pertinent items are noted in HPI.  Past Medical History  Diagnosis Date  . Coronary artery disease     STATUS POST OLD INFERIOR WALL M.I.  . Diabetes mellitus   . Chronic anticoagulation   . Atrial fibrillation   . MI, old 2008    History  Substance Use Topics  . Smoking status: Never Smoker   . Smokeless tobacco: Not on file  . Alcohol Use: No    Family History  Problem Relation Age of Onset  . Cancer Father 27   Allergies  Allergen Reactions  . Bactrim (  Sulfamethoxazole W-Trimethoprim) Hives  . Adhesive (Tape)     WHITE tape: Causes blisters and skin reaction  . Sulfa Antibiotics     Hyperkalemia with bactrim  . Penicillins Itching and Rash    Childhood allergy:     OBJECTIVE: Blood pressure 147/94, pulse 73, temperature 97.9 F (36.6 C), temperature source Oral, resp. rate 18, height 5\' 7"  (1.702 m), weight 91 kg (200 lb 9.9 oz), SpO2 96.00%. General: She is in bed and in no distress. She is alert and conversant. Skin: No generalized rash Lungs: Clear Cor: Regular S1 and S2 no murmurs Abdomen: Nontender Extremities: She has discoloration of her lower legs bilaterally compatible with venous stasis dermatitis. She has ulcers of her lower legs bilaterally  with some hearing exudate. Ink marks that defined the border of her cellulitis upon admission showed that the erythema has receded significantly.  Microbiology: Recent Results (from the past 240 hour(s))  URINE CULTURE     Status: Normal   Collection Time   10/17/11  3:32 PM      Component Value Range Status Comment   Specimen Description URINE, CLEAN CATCH   Final    Special Requests ADDED 10/17/11 1707   Final    Culture  Setup Time 10/17/2011 17:45   Final    Colony Count 15,000 COLONIES/ML   Final    Culture     Final    Value: Multiple bacterial morphotypes present, none predominant. Suggest appropriate recollection if clinically indicated.   Report Status 10/19/2011 FINAL   Final   WOUND CULTURE     Status: Normal   Collection Time   10/18/11 10:17 AM      Component Value Range Status Comment   Specimen Description WOUND LEFT LEG   Final    Special Requests Normal   Final    Gram Stain     Final    Value: NO WBC SEEN     NO SQUAMOUS EPITHELIAL CELLS SEEN     ABUNDANT GRAM POSITIVE COCCI IN PAIRS     ABUNDANT GRAM NEGATIVE COCCOBACILLI   Culture     Final    Value: ABUNDANT METHICILLIN RESISTANT STAPHYLOCOCCUS AUREUS     Note: RIFAMPIN AND GENTAMICIN SHOULD NOT BE USED AS SINGLE DRUGS FOR TREATMENT OF STAPH INFECTIONS. This organism DOES NOT demonstrate inducible Clindamycin resistance in vitro. CRITICAL RESULT CALLED TO, READ BACK BY AND VERIFIED WITH: PEGGY [email or web address]       ON [id number] BY [facility]     FEW KLEBSIELLA PNEUMONIAE   Report Status 10/20/2011 FINAL   Final    Organism ID, Bacteria METHICILLIN RESISTANT STAPHYLOCOCCUS AUREUS   Final    Organism ID, Bacteria KLEBSIELLA PNEUMONIAE   Final     Cliffton Asters, MD [facility] [phone] pager   [phone] cell 10/24/2011, 12:27 PM

## 2011-10-25 ENCOUNTER — Telehealth: Payer: Self-pay | Admitting: Vascular Surgery

## 2011-10-25 LAB — PROTIME-INR
INR: 3.32 — ABNORMAL HIGH (ref 0.00–1.49)
Prothrombin Time: 34.2 seconds — ABNORMAL HIGH (ref 11.6–15.2)

## 2011-10-25 LAB — GLUCOSE, CAPILLARY

## 2011-10-25 LAB — CBC
HCT: 37.4 % (ref 36.0–46.0)
MCHC: 33.2 g/dL (ref 30.0–36.0)
Platelets: 262 10*3/uL (ref 150–400)
RDW: 12.5 % (ref 11.5–15.5)
WBC: 11.9 10*3/uL — ABNORMAL HIGH (ref 4.0–10.5)

## 2011-10-25 LAB — BASIC METABOLIC PANEL
BUN: 36 mg/dL — ABNORMAL HIGH (ref 6–23)
GFR calc Af Amer: 55 mL/min — ABNORMAL LOW (ref >90)
GFR calc non Af Amer: 48 mL/min — ABNORMAL LOW (ref >90)
Potassium: 3.9 mEq/L (ref 3.5–5.1)

## 2011-10-25 MED ORDER — SENNOSIDES-DOCUSATE SODIUM 8.6-50 MG PO TABS: 1.0000 | ORAL_TABLET | Freq: Every day | ORAL | Status: DC

## 2011-10-25 MED ORDER — DOXYCYCLINE HYCLATE 100 MG PO TABS: 100.0000 mg | ORAL_TABLET | Freq: Two times a day (BID) | ORAL | Status: AC

## 2011-10-25 MED ORDER — METOCLOPRAMIDE HCL 5 MG PO TABS: 5.0000 mg | ORAL_TABLET | Freq: Three times a day (TID) | ORAL | Status: DC

## 2011-10-25 MED ORDER — TRAMADOL HCL 50 MG PO TABS: 50.0000 mg | ORAL_TABLET | Freq: Four times a day (QID) | ORAL | Status: AC

## 2011-10-25 MED ORDER — INSULIN ASPART 100 UNIT/ML ~~LOC~~ SOLN: SUBCUTANEOUS | Status: DC

## 2011-10-25 MED ORDER — BISACODYL 5 MG PO TBEC: 5.0000 mg | DELAYED_RELEASE_TABLET | Freq: Every day | ORAL | Status: AC | PRN

## 2011-10-25 MED ORDER — CEFUROXIME AXETIL 500 MG PO TABS: 500.0000 mg | ORAL_TABLET | Freq: Two times a day (BID) | ORAL | Status: AC

## 2011-10-25 MED ORDER — HYDROCODONE-ACETAMINOPHEN 5-325 MG PO TABS: 1.0000 | ORAL_TABLET | Freq: Once | ORAL | Status: DC

## 2011-10-25 MED ORDER — METOCLOPRAMIDE HCL 5 MG PO TABS
5.0000 mg | ORAL_TABLET | Freq: Three times a day (TID) | ORAL | Status: DC
  Filled 2011-10-25 (×3): qty 1

## 2011-10-25 MED ORDER — WARFARIN SODIUM 5 MG PO TABS: 2.5000 mg | ORAL_TABLET | Freq: Every day | ORAL | Status: DC

## 2011-10-25 MED ORDER — ACETAMINOPHEN 650 MG RE SUPP: 650.0000 mg | RECTAL | Status: DC | PRN

## 2011-10-25 MED ORDER — ACETAMINOPHEN 325 MG PO TABS: 650.0000 mg | ORAL_TABLET | Freq: Four times a day (QID) | ORAL | Status: AC | PRN

## 2011-10-25 MED ORDER — INSULIN GLARGINE 100 UNIT/ML ~~LOC~~ SOLN: 15.0000 [IU] | Freq: Every day | SUBCUTANEOUS | Status: DC

## 2011-10-25 MED ORDER — ACETAMINOPHEN 325 MG PO TABS
650.0000 mg | ORAL_TABLET | ORAL | Status: DC | PRN
  Administered 2011-10-25 (×4): 650 mg via ORAL
  Filled 2011-10-25 (×3): qty 2

## 2011-10-25 NOTE — Progress Notes (Signed)
Physical Therapy Treatment Patient Details Name: Kathryn Schroeder MRN: [id number] DOB: 1930/12/05 Today's Date: 10/25/2011 Time: 4098-1191 PT Time Calculation (min): 12 min  PT Assessment / Plan / Recommendation Comments on Treatment Session  Pt.'s mobility limited by nausea this am.   Legs remain painful especially when she is up on them.    Follow Up Recommendations  Skilled nursing facility    Barriers to Discharge        Equipment Recommendations  Defer to next venue    Recommendations for Other Services    Frequency Min 3X/week   Plan Discharge plan remains appropriate    Precautions / Restrictions Precautions Precautions: Fall Restrictions Weight Bearing Restrictions: No   Pertinent Vitals/Pain Pain in both lower legs 7/10; RN aware    Mobility  Bed Mobility Bed Mobility: Not assessed (pt. up in chair) Transfers Transfers: Sit to Stand;Stand to Sit Sit to Stand: From chair/3-in-1;4: Min assist;With upper extremity assist Stand to Sit: 4: Min assist;With upper extremity assist;To chair/3-in-1 Details for Transfer Assistance: pt. able to stand with min assist on 2nd attempt, some initial difficulty rising to stand.  VC's for hand placement Ambulation/Gait Ambulation/Gait Assistance: 4: Min guard Ambulation Distance (Feet): 45 Feet Assistive device: Rolling walker Ambulation/Gait Assistance Details: manual assist to stabilize; pt. declined hallway ambulation due to her nausea.  She reports RN gave her nausea med earlier Gait Pattern: Step-through pattern    Exercises     PT Diagnosis:    PT Problem List:   PT Treatment Interventions:     PT Goals Acute Rehab PT Goals PT Goal: Sit to Stand - Progress: Progressing toward goal PT Goal: Stand to Sit - Progress: Progressing toward goal PT Goal: Ambulate - Progress: Progressing toward goal  Visit Information  Last PT Received On: 10/25/11 Assistance Needed: +1    Subjective Data  Subjective: I feel so  nauseated this morning   Cognition  Overall Cognitive Status: Appears within functional limits for tasks assessed/performed Area of Impairment: Memory Arousal/Alertness: Awake/alert Orientation Level: Appears intact for tasks assessed Behavior During Session: [facility] for tasks performed Current Attention Level: Selective    Balance     End of Session PT - End of Session Equipment Utilized During Treatment: Gait belt Activity Tolerance: Patient limited by fatigue;Other (comment) (limited by nausea) Patient left: in chair;with call bell/phone within reach;with family/visitor present Nurse Communication: Mobility status   GP     Ferman Hamming 10/25/2011, 12:26 PM Acute Rehabilitation Services [phone] (pager)

## 2011-10-25 NOTE — Progress Notes (Signed)
ANTICOAGULATION CONSULT NOTE - Follow Up Consult  Pharmacy Consult for Coumadin Indication: atrial fibrillation Cellulitis   Allergies  Allergen Reactions  . Bactrim (Sulfamethoxazole W-Trimethoprim) Hives  . Adhesive (Tape)     WHITE tape: Causes blisters and skin reaction  . Sulfa Antibiotics     Hyperkalemia with bactrim  . Penicillins Itching and Rash    Childhood allergy:     Patient Measurements: Height: 5\' 7"  (170.2 cm) Weight: 200 lb 9.9 oz (91 kg) IBW/kg (Calculated) : 61.6    Vital Signs: Temp: 97.7 F (36.5 C) (07/19 0502) Temp src: Oral (07/19 0502) BP: 151/84 mmHg (07/19 0502) Pulse Rate: 66  (07/19 0502)  Labs:  Basename 10/25/11 2956 10/24/11 0605 10/23/11 0620  HGB 12.4 12.1 --  HCT 37.4 36.2 34.8*  PLT 262 265 257  APTT -- -- --  LABPROT 34.2* 37.4* 39.3*  INR 3.32* 3.72* 3.96*  HEPARINUNFRC -- -- --  CREATININE 1.08 1.00 0.92  CKTOTAL -- -- --  CKMB -- -- --  TROPONINI -- -- --    Estimated Creatinine Clearance: 48.9 ml/min (by C-G formula based on Cr of 1.08).     Assessment:  Patient is a 78 yo female on chronic Coumadin therapy for history of atrial fibrillation. INR today remains supratherapeutic at 3.32. PTA dose of 5 mg daily except 2.5 mg on Tues and Sat. CBC stable from yesterday.  Day #9 antibiotic therapy for leg cellulitis currently on cefuroxime (levofloxacin dced)   Goal of Therapy:  INR 2-3 Monitor platelets by anticoagulation protocol: Yes   Plan:  No Coumadin today.  Follow up daily INR   Lucille Passy 10/25/2011,9:32 AM

## 2011-10-25 NOTE — Telephone Encounter (Addendum)
Message copied by Rosalyn Charters on Fri Oct 25, 2011  4:53 PM ------      Message from: Melene Plan      Created: Fri Oct 25, 2011  3:28 PM      Regarding: FW: F/U                   ----- Message -----         From: Chuck Hint, MD         Sent: 10/25/2011   3:04 PM           To: Melene Plan, RN, Conley Simmonds Pullins, RN      Subject: F/U                                                      She needs a F/U apt in 2-3 weeks      Thanks      [facility]  L/V/M NOTIFYING PATIENT OF FU APPT.WITH DR. DICKSON ON 11-13-11 3:30

## 2011-10-25 NOTE — Progress Notes (Signed)
Attempted to call report to [facility]. Left message with admission coordinator to call us. Pt is ready for discharge. Informed Erie Noe that transport can be called. IV removed. Tele removed. Assessment unchanged from morning.

## 2011-10-25 NOTE — Clinical Social Work Note (Signed)
Patient medically stable for discharge today to [facility] in [place]. Discharge information forwarded to facility. CSW facilitated transport to SNF via ambulance, with daughter at the bedside.  Genelle Bal, MSW, LCSW [phone]

## 2011-10-26 ENCOUNTER — Encounter: Payer: Self-pay | Admitting: Internal Medicine

## 2011-10-30 NOTE — Progress Notes (Signed)
agree with above

## 2011-11-07 ENCOUNTER — Telehealth: Payer: Self-pay | Admitting: *Deleted

## 2011-11-07 NOTE — Telephone Encounter (Signed)
Ms. Vandenberg called in to cancel her appt w/ [facility] on 11-13-11.  She said that Dr. Tanda Rockers was seeing her and she had transportation issues. I called [facility] ( Dr. Tanda Rockers [phone]) and spoke to the nurse, she said Dr. Tanda Rockers saw the patient on 10-30-11 and the wounds were stable. She said that it was OK to cancel [facility]'s appt because they would be seeing Mrs Deblasi on that day. They will contact us if the patient's wounds worsen or don't show healing.  I called Mrs. Jemmott back and we discussed this plan, patient is in agreement and voiced understanding; she will keep her appt with Dr. Tanda Rockers on 11-13-11.

## 2011-11-13 ENCOUNTER — Ambulatory Visit: Payer: Medicare Other | Admitting: Vascular Surgery

## 2011-12-10 ENCOUNTER — Encounter: Payer: Self-pay | Admitting: Vascular Surgery

## 2011-12-11 ENCOUNTER — Encounter: Payer: Self-pay | Admitting: Vascular Surgery

## 2011-12-11 ENCOUNTER — Other Ambulatory Visit: Payer: Self-pay

## 2011-12-11 ENCOUNTER — Ambulatory Visit (INDEPENDENT_AMBULATORY_CARE_PROVIDER_SITE_OTHER): Payer: Medicare Other | Admitting: Vascular Surgery

## 2011-12-11 VITALS — BP 147/66 | HR 77 | Temp 97.5°F | Ht 67.0 in | Wt 195.0 lb

## 2011-12-11 DIAGNOSIS — I83009 Varicose veins of unspecified lower extremity with ulcer of unspecified site: Secondary | ICD-10-CM

## 2011-12-11 MED ORDER — FENTANYL CITRATE 0.05 MG/ML IJ SOLN
INTRAMUSCULAR | Status: AC
  Filled 2011-12-11: qty 2

## 2011-12-11 MED ORDER — DOXYCYCLINE HYCLATE 100 MG PO CAPS: 100.0000 mg | ORAL_CAPSULE | Freq: Two times a day (BID) | ORAL | Status: AC

## 2011-12-11 MED ORDER — LIDOCAINE HCL (PF) 1 % IJ SOLN
INTRAMUSCULAR | Status: AC
  Filled 2011-12-11: qty 30

## 2011-12-11 NOTE — Progress Notes (Signed)
[facility] [place]  Patient name: Kathryn Schroeder MRN: [id number] DOB: [birth year] Sex: female  REASON FOR VISIT: follow up of bilateral venous stasis ulcers and peripheral vascular disease  HPI: Kathryn Schroeder is a 78 y.o. female who I saw in consultation on 10/17/2011 in the hospital with bilateral venous stasis ulcers. We discussed the importance of intermittent leg elevation and compression therapy. In addition she had evidence of mild infrainguinal arterial occlusive disease but I thought she likely had adequate circulation to heal these wounds. I planed on following these wounds as an outpatient. She comes in for a follow up visit.  She does not describe any claudication. She is ambulatory. I do not get any symptoms consistent with rest pain in her feet. She has had some pain at the ball of her foot but this does not seem to be relieved with dependency and does not fit with classic rest pain. The wounds have actually progressed. She developed a new wound on her right lateral leg and also new wounds on her left leg. She has been going to the wound care center and receiving aggressive outpatient care.  Past Medical History  Diagnosis Date  . Coronary artery disease     STATUS POST OLD INFERIOR WALL M.I.  . Diabetes mellitus   . Chronic anticoagulation   . Atrial fibrillation   . Hypertension   . Hyperlipidemia   . MI, old 2008    Family History  Problem Relation Age of Onset  . Cancer Father 65  . Cancer Mother   . Cancer Daughter   . Diabetes Daughter   . Hypertension Daughter   . Other Daughter     varicose veins    SOCIAL HISTORY: History  Substance Use Topics  . Smoking status: Never Smoker   . Smokeless tobacco: Never Used  . Alcohol Use: No    Allergies  Allergen Reactions  . Bactrim (Sulfamethoxazole W-Trimethoprim) Hives  . Adhesive (Tape)     WHITE tape: Causes blisters and skin reaction  . Sulfa Antibiotics     Hyperkalemia  with bactrim  . Penicillins Itching and Rash    Childhood allergy:     Current Outpatient Prescriptions  Medication Sig Dispense Refill  . acetaminophen (TYLENOL) 325 MG tablet Take 2 tablets (650 mg total) by mouth every 6 (six) hours as needed (or Fever >/= 101).  100 tablet  0  . aspirin 81 MG tablet Take 81 mg by mouth every morning.       . brimonidine-timolol (COMBIGAN) 0.2-0.5 % ophthalmic solution Place 1 drop into both eyes 2 (two) times daily.      . carvedilol (COREG) 12.5 MG tablet Take 12.5 mg by mouth 2 (two) times daily with a meal.        . Cholecalciferol (VITAMIN D3) 400 UNITS CAPS Take by mouth every morning.       Marland Kitchen losartan (COZAAR) 100 MG tablet Take 25 mg by mouth daily.       . magnesium oxide (MAG-OX) 400 MG tablet Take 400 mg by mouth 2 (two) times daily.        . Omega-3 Fatty Acids (FISH OIL) 1000 MG CAPS Take 1 capsule by mouth every morning.       . SitaGLIPtin-MetFORMIN HCl [id number] MG TB24 Take by mouth 2 (two) times daily.      . traMADol (ULTRAM) 50 MG tablet Take 50 mg by mouth every 8 (eight) hours as needed.      Marland Kitchen  warfarin (COUMADIN) 5 MG tablet Take 0.5-1 tablets (2.5-5 mg total) by mouth daily. Patient takes 5 mg daily except 2.5 mg on Tuesday's and Saturday's  30 tablet  0  . doxycycline (VIBRAMYCIN) 100 MG capsule Take 1 capsule (100 mg total) by mouth 2 (two) times daily.  28 capsule  0  . insulin aspart (NOVOLOG) 100 UNIT/ML injection For CBG 70-120, no Insulin; CBG 121-150, 2 units; CBG 151-200, 3 units; CBG 201-250, 5 units; CBG 251-300, 8 units; CBG 301-350, 11 units; CBG 351-400, 15 units.  1 vial  0  . insulin glargine (LANTUS) 100 UNIT/ML injection Inject 15 Units into the skin at bedtime.  10 mL  0  . metoCLOPramide (REGLAN) 5 MG tablet Take 1 tablet (5 mg total) by mouth 4 (four) times daily -  before meals and at bedtime.  40 tablet  0  . senna-docusate (SENOKOT-S) 8.6-50 MG per tablet Take 1 tablet by mouth daily.  30 tablet  0    REVIEW  OF SYSTEMS: Arly.Keller ] denotes positive finding; [  ] denotes negative finding CARDIOVASCULAR:  [ ]  chest pain   [ ]  chest pressure   [ ]  palpitations   Arly.Keller ] orthopnea   Arly.Keller ] dyspnea on exertion   [ ]  claudication   [ ]  rest pain   [ ]  DVT   [ ]  phlebitis PULMONARY:   [ ]  productive cough   [ ]  asthma   [ ]  wheezing NEUROLOGIC:   [ ]  weakness  [ ]  paresthesias  [ ]  aphasia  [ ]  amaurosis  [ ]  dizziness HEMATOLOGIC:   [ ]  bleeding problems   [ ]  clotting disorders MUSCULOSKELETAL:  [ ]  joint pain   [ ]  joint swelling [ ]  leg swelling GASTROINTESTINAL: [ ]   blood in stool  [ ]   hematemesis GENITOURINARY:  [ ]   dysuria  [ ]   hematuria PSYCHIATRIC:  [ ]  history of major depression INTEGUMENTARY:  [ ]  rashes  [ ]  ulcers CONSTITUTIONAL:  [ ]  fever   [ ]  chills  PHYSICAL EXAM: Filed Vitals:   12/11/11 0948  BP: 147/66  Pulse: 77  Temp: 97.5 F (36.4 C)  TempSrc: Oral  Height: 5\' 7"  (1.702 m)  Weight: 195 lb (88.451 kg)  SpO2: 100%   Body mass index is 30.54 kg/(m^2). GENERAL: The patient is a well-nourished female, in no acute distress. The vital signs are documented above. CARDIOVASCULAR: There is a regular rate and rhythm. I do not detect carotid bruits. She has palpable femoral pulses. I cannot palpate popliteal pulses although she does have weakly palpable dorsalis pedis pulses. She has biphasic dorsalis pedis signals. She has monophasic peroneal signals. PULMONARY: There is good air exchange bilaterally without wheezing or rales. ABDOMEN: Soft and non-tender with normal pitched bowel sounds. She does have a large pannus. MUSCULOSKELETAL: There are no major deformities or cyanosis. NEUROLOGIC: No focal weakness or paresthesias are detected. SKIN: she has a full thickness wound on the lateral aspect of her right leg with mild erythema. She has essentially a circumferential venous stasis ulcer on her left leg which is very extensive with surrounding erythema. PSYCHIATRIC: The patient has a  normal affect.  DATA:  I have independently interpreted her Doppler study which shows monophasic Doppler signals in both feet on 10/20/2011. ABI on the right was greater than 100% and ABI on the left with 85%. I think that these ABIs are falsely elevated as she has significant calcific disease.  The  patient is on Coumadin for atrial fibrillation.  MEDICAL ISSUES: This patient has nonhealing venous stasis ulcers of both lower extremities. I think she also has evidence of diffuse peripheral vascular disease although she has a reasonable Doppler flow in the dorsalis pedis positions bilaterally. However, given the extensive nature of her wounds I have recommended we proceed with arteriography to further assess her circulation. I think this could become a limb threatening situation given the extent of the wound especially on the left leg.I have reviewed with the patient the indications for arteriography. In addition, I have reviewed the potential complications of arteriography including but not limited to: Bleeding, arterial injury, arterial thrombosis, dye action, renal insufficiency, or other unpredictable medical problems. I have explained to the patient that if we find disease amenable to angioplasty we could potentially address this at the same time. I have discussed the potential complications of angioplasty and stenting, including but not limited to: Bleeding, arterial thrombosis, arterial injury, dissection, or the need for surgical intervention. We will have to hold her Coumadin 4 days prior to the procedure. Of note her most recent creatinine was normal. We'll make further recommendations pending the results of her arteriogram. If her circulation is fairly reasonable and we can feel better about continuing with elevation and compression therapy and possibly consider hyperbaric oxygen. If on the other hand she has more significant infrainguinal arterial occlusive disease and is not a candidate for  endovascular approach we would have to consider bypass. Certainly she would be at increased risk for bypass given her age, her cardiac history, and obesity.  Venida Tsukamoto S [facility] [place] Beeper: [phone]

## 2011-12-15 MED ORDER — SODIUM CHLORIDE 0.9 % IV SOLN
INTRAVENOUS | Status: DC
  Administered 2011-12-16: 08:00:00 via INTRAVENOUS

## 2011-12-16 ENCOUNTER — Encounter (HOSPITAL_COMMUNITY)
Admission: RE | Disposition: A | Discharge status: Home / Self Care | Payer: Self-pay | Source: Ambulatory Visit | Attending: Vascular Surgery

## 2011-12-16 ENCOUNTER — Ambulatory Visit (HOSPITAL_COMMUNITY): Payer: Medicare Other

## 2011-12-16 ENCOUNTER — Ambulatory Visit (HOSPITAL_COMMUNITY)
Admission: RE | Admit: 2011-12-16 | Discharge: 2011-12-16 | Disposition: A | Discharge status: Home / Self Care | Payer: Medicare Other | Source: Ambulatory Visit | Attending: Vascular Surgery | Admitting: Vascular Surgery

## 2011-12-16 DIAGNOSIS — Z5309 Procedure and treatment not carried out because of other contraindication: Secondary | ICD-10-CM | POA: Insufficient documentation

## 2011-12-16 DIAGNOSIS — E042 Nontoxic multinodular goiter: Secondary | ICD-10-CM | POA: Insufficient documentation

## 2011-12-16 DIAGNOSIS — R791 Abnormal coagulation profile: Secondary | ICD-10-CM | POA: Insufficient documentation

## 2011-12-16 DIAGNOSIS — K8020 Calculus of gallbladder without cholecystitis without obstruction: Secondary | ICD-10-CM | POA: Insufficient documentation

## 2011-12-16 DIAGNOSIS — I739 Peripheral vascular disease, unspecified: Secondary | ICD-10-CM | POA: Insufficient documentation

## 2011-12-16 LAB — POCT I-STAT, CHEM 8
BUN: 25 mg/dL — ABNORMAL HIGH (ref 6–23)
Calcium, Ion: 1.23 mmol/L (ref 1.13–1.30)
Chloride: 102 mEq/L (ref 96–112)
Glucose, Bld: 127 mg/dL — ABNORMAL HIGH (ref 70–99)

## 2011-12-16 SURGERY — ABDOMINAL AORTAGRAM
Anesthesia: LOCAL

## 2011-12-16 MED ORDER — IOHEXOL 350 MG/ML SOLN
200.0000 mL | Freq: Once | INTRAVENOUS | Status: AC | PRN
  Administered 2011-12-16: 200 mL via INTRAVENOUS

## 2011-12-16 NOTE — Progress Notes (Signed)
VASCULAR PROGRESS NOTE  PHYSICAL EXAM: Filed Vitals:   12/16/11 0717  BP: 161/85  Pulse: 97  Temp: 97 F (36.1 C)  TempSrc: Oral  Resp: 16  Height: 5\' 8"  (1.727 m)  Weight: 195 lb (88.451 kg)  SpO2: 98%   Dressings with min drainage  LABS: Lab Results  Component Value Date   WBC 11.9* 10/25/2011   HGB 11.6* 12/16/2011   HCT 34.0* 12/16/2011   MCV 90.8 10/25/2011   PLT 262 10/25/2011   Lab Results  Component Value Date   CREATININE 1.00 12/16/2011   Lab Results  Component Value Date   INR 2.22* 12/16/2011   CBG (last 3)  No results found for this basename: GLUCAP:3 in the last 72 hours   ASSESSMENT/PLAN: INR -= 2.2, therefore, not safe to proceed with aortogram today. Will obtain CT Angio Aorta and runoff in order to see if this provides adequate info.  Waverly Ferrari, MD, FACS Beeper: [phone] 12/16/2011

## 2011-12-17 ENCOUNTER — Telehealth: Payer: Self-pay | Admitting: Vascular Surgery

## 2011-12-17 NOTE — Telephone Encounter (Addendum)
Message copied by Shari Prows on Tue Dec 17, 2011 11:29 AM ------      Message from: Phillips Odor      Created: Tue Dec 17, 2011 10:39 AM      Regarding: FW: F/U                   ----- Message -----         From: Chuck Hint, MD         Sent: 12/17/2011  10:35 AM           To: Melene Plan, RN, Conley Simmonds Pullins, RN      Subject: F/U                                                      This patient needs a follow up visit in 4 weeks to check on her venous ulcers.  I scheduled an appt for this pt on 01/15/12 at 1:15pm w/ dr.Dickson. I mailed an appt letter and also left a message for the patient on her voicemail.awt

## 2012-01-02 ENCOUNTER — Emergency Department (HOSPITAL_COMMUNITY)
Admission: EM | Admit: 2012-01-02 | Discharge: 2012-01-02 | Disposition: A | Discharge status: Home / Self Care | Payer: Medicare Other | Attending: Emergency Medicine | Admitting: Emergency Medicine

## 2012-01-02 ENCOUNTER — Encounter (HOSPITAL_COMMUNITY): Payer: Self-pay | Admitting: Emergency Medicine

## 2012-01-02 DIAGNOSIS — Z7901 Long term (current) use of anticoagulants: Secondary | ICD-10-CM | POA: Insufficient documentation

## 2012-01-02 DIAGNOSIS — Z885 Allergy status to narcotic agent status: Secondary | ICD-10-CM | POA: Insufficient documentation

## 2012-01-02 DIAGNOSIS — I252 Old myocardial infarction: Secondary | ICD-10-CM | POA: Insufficient documentation

## 2012-01-02 DIAGNOSIS — Z79899 Other long term (current) drug therapy: Secondary | ICD-10-CM | POA: Insufficient documentation

## 2012-01-02 DIAGNOSIS — I4891 Unspecified atrial fibrillation: Secondary | ICD-10-CM | POA: Insufficient documentation

## 2012-01-02 DIAGNOSIS — I2510 Atherosclerotic heart disease of native coronary artery without angina pectoris: Secondary | ICD-10-CM | POA: Insufficient documentation

## 2012-01-02 DIAGNOSIS — R58 Hemorrhage, not elsewhere classified: Secondary | ICD-10-CM

## 2012-01-02 DIAGNOSIS — I10 Essential (primary) hypertension: Secondary | ICD-10-CM | POA: Insufficient documentation

## 2012-01-02 DIAGNOSIS — I83009 Varicose veins of unspecified lower extremity with ulcer of unspecified site: Secondary | ICD-10-CM

## 2012-01-02 DIAGNOSIS — E119 Type 2 diabetes mellitus without complications: Secondary | ICD-10-CM | POA: Insufficient documentation

## 2012-01-02 DIAGNOSIS — Z880 Allergy status to penicillin: Secondary | ICD-10-CM | POA: Insufficient documentation

## 2012-01-02 DIAGNOSIS — E785 Hyperlipidemia, unspecified: Secondary | ICD-10-CM | POA: Insufficient documentation

## 2012-01-02 DIAGNOSIS — I872 Venous insufficiency (chronic) (peripheral): Secondary | ICD-10-CM | POA: Insufficient documentation

## 2012-01-02 DIAGNOSIS — Z881 Allergy status to other antibiotic agents status: Secondary | ICD-10-CM | POA: Insufficient documentation

## 2012-01-02 DIAGNOSIS — Z882 Allergy status to sulfonamides status: Secondary | ICD-10-CM | POA: Insufficient documentation

## 2012-01-02 DIAGNOSIS — L97309 Non-pressure chronic ulcer of unspecified ankle with unspecified severity: Secondary | ICD-10-CM | POA: Insufficient documentation

## 2012-01-02 LAB — CBC
Hemoglobin: 10.8 g/dL — ABNORMAL LOW (ref 12.0–15.0)
MCH: 30.2 pg (ref 26.0–34.0)
MCV: 91.3 fL (ref 78.0–100.0)
RBC: 3.58 MIL/uL — ABNORMAL LOW (ref 3.87–5.11)

## 2012-01-02 LAB — PROTIME-INR: Prothrombin Time: 30.4 seconds — ABNORMAL HIGH (ref 11.6–15.2)

## 2012-01-02 MED ORDER — HYDROCODONE-ACETAMINOPHEN 5-325 MG PO TABS
1.0000 | ORAL_TABLET | Freq: Once | ORAL | Status: DC
  Filled 2012-01-02: qty 1

## 2012-01-02 MED ORDER — "THROMBI-PAD 3""X3"" EX PADS"
1.0000 | MEDICATED_PAD | Freq: Once | CUTANEOUS | Status: AC
  Administered 2012-01-02: 1 via TOPICAL
  Filled 2012-01-02: qty 1

## 2012-01-02 MED ORDER — FENTANYL CITRATE 0.05 MG/ML IJ SOLN
25.0000 ug | Freq: Once | INTRAMUSCULAR | Status: DC
Start: 2012-01-02 — End: 2012-01-02

## 2012-01-02 MED ORDER — TRAMADOL HCL 50 MG PO TABS
50.0000 mg | ORAL_TABLET | Freq: Once | ORAL | Status: AC
  Administered 2012-01-02: 50 mg via ORAL
  Filled 2012-01-02: qty 1

## 2012-01-02 NOTE — ED Provider Notes (Signed)
History     CSN: [id number]  Arrival date & time 01/02/12  1408   First MD Initiated Contact with Patient 01/02/12 1725      Chief Complaint  Patient presents with  . Leg Pain    (Consider location/radiation/quality/duration/timing/severity/associated sxs/prior treatment) Patient is a 78 y.o. female presenting with leg pain. The history is provided by the patient and a relative.  Leg Pain  The incident occurred more than 1 week ago. The incident occurred at home. There was no injury mechanism. The pain is present in the left ankle and left heel. The pain is moderate. The pain has been constant since onset. Associated symptoms include inability to bear weight. Pertinent negatives include no numbness, no loss of motion, no muscle weakness, no loss of sensation and no tingling. Associated symptoms comments: Bleeding from venous stasis ulcer. She reports no foreign bodies present. The symptoms are aggravated by bearing weight and palpation. Treatments tried: wound care treatmetn. The treatment provided mild relief.    Past Medical History  Diagnosis Date  . Coronary artery disease     STATUS POST OLD INFERIOR WALL M.I.  . Diabetes mellitus   . Chronic anticoagulation   . Atrial fibrillation   . Hypertension   . Hyperlipidemia   . MI, old 2008    Past Surgical History  Procedure Date  . Appendectomy   . Total hip arthroplasty     RIGHT HIP  . Abdominal hysterectomy 1976    1976  . Cataract extraction     Bilateral  . Breast biopsy 2013    Family History  Problem Relation Age of Onset  . Cancer Father 24  . Cancer Mother   . Cancer Daughter   . Diabetes Daughter   . Hypertension Daughter   . Other Daughter     varicose veins    History  Substance Use Topics  . Smoking status: Never Smoker   . Smokeless tobacco: Never Used  . Alcohol Use: No    OB History    Grav Para Term Preterm Abortions TAB SAB Ect Mult Living                  Review of Systems    Constitutional: Negative for fever.  Respiratory: Negative for cough and shortness of breath.   Cardiovascular: Negative for chest pain.  Gastrointestinal: Negative for nausea, abdominal pain and diarrhea.  Skin: Positive for wound (left heel as well as on bilateral legs and feet).  Neurological: Negative for tingling, numbness and headaches.  All other systems reviewed and are negative.    Allergies  Bactrim; Janumet; Adhesive; Ciprofloxacin; Percocet; Sulfa antibiotics; and Penicillins  Home Medications   Current Outpatient Rx  Name Route Sig Dispense Refill  . ACETAMINOPHEN 325 MG PO TABS Oral Take 2 tablets (650 mg total) by mouth every 6 (six) hours as needed (or Fever >/= 101). 100 tablet 0  . ASPIRIN 81 MG PO TABS Oral Take 81 mg by mouth every morning.     Marland Kitchen BRIMONIDINE TARTRATE-TIMOLOL 0.2-0.5 % OP SOLN Both Eyes Place 1 drop into both eyes 2 (two) times daily.    Marland Kitchen CARVEDILOL 12.5 MG PO TABS Oral Take 12.5 mg by mouth 2 (two) times daily with a meal.      . VITAMIN D3 400 UNITS PO CAPS Oral Take 1 tablet by mouth daily.     Marland Kitchen DOXYCYCLINE HYCLATE 100 MG PO TABS Oral Take 100 mg by mouth 2 (two) times daily.    Marland Kitchen  LOSARTAN POTASSIUM 25 MG PO TABS Oral Take 25 mg by mouth daily.    Marland Kitchen MAGNESIUM OXIDE 400 MG PO TABS Oral Take 400 mg by mouth 2 (two) times daily.      Marland Kitchen METFORMIN HCL 500 MG PO TABS Oral Take 500 mg by mouth daily.    Marland Kitchen FISH OIL 1000 MG PO CAPS Oral Take 1 capsule by mouth every morning.     . RED YEAST RICE PO Oral Take 1 tablet by mouth at bedtime.    . WARFARIN SODIUM 5 MG PO TABS Oral Take 0.5-1 tablets (2.5-5 mg total) by mouth daily. Patient takes 5 mg daily except 2.5 mg on Tuesday's and Saturday's 30 tablet 0    Per INR MD to review.  Marland Kitchen METOCLOPRAMIDE HCL 5 MG PO TABS Oral Take 1 tablet (5 mg total) by mouth 4 (four) times daily -  before meals and at bedtime. 40 tablet 0  . TRAMADOL HCL 50 MG PO TABS Oral Take 50 mg by mouth every 8 (eight) hours as  needed. For pain      BP 105/59  Pulse 96  Temp 98.7 F (37.1 C) (Oral)  Resp 18  SpO2 97%  Physical Exam  Nursing note and vitals reviewed. Constitutional: She is oriented to person, place, and time. She appears well-developed and well-nourished. No distress.  HENT:  Head: Normocephalic and atraumatic.  Eyes: EOM are normal. Pupils are equal, round, and reactive to light.  Neck: Normal range of motion.  Cardiovascular: Normal rate and normal heart sounds.   Pulmonary/Chest: Effort normal and breath sounds normal. No respiratory distress.  Abdominal: Soft. She exhibits no distension. There is no tenderness.  Musculoskeletal: Normal range of motion.       Feet:  Neurological: She is alert and oriented to person, place, and time.  Skin: Skin is warm and dry.    ED Course  Procedures (including critical care time)  Labs Reviewed  PROTIME-INR - Abnormal; Notable for the following:    Prothrombin Time 30.4 (*)     INR 3.12 (*)     All other components within normal limits  CBC - Abnormal; Notable for the following:    RBC 3.58 (*)     Hemoglobin 10.8 (*)     HCT 32.7 (*)     All other components within normal limits   No results found.   1. Bleeding   2. Venous stasis ulcers       MDM  5:26 PM Pt seen and examined. Pt with bleeding from left heel since around 11 AM this morning. She was sent from wound care clinic due to this. Patient also with chronic exposure to achilles tendon, now with concern for rupture per wound care. Will first check INR and get bleeding under control (seems to only be slight now).  INR slightly elevated. Will apply thromi-pad to help with clotting. Wounds dressed with Vaseline gauze. Pt instructed to follow with either wound care or ortho tomorrow for recheck. Pt instructed not to bear weight but do not feel it is safe to splint due to the extent of her venous stasis ulcers and the need to be able to visualize those. Pt placed in post-op shoe  for extra support.         Daleen Bo, MD 01/03/12 [phone]

## 2012-01-02 NOTE — ED Provider Notes (Signed)
78 year old female who of with atrial fibrillation for which she is taking warfarin, and multiple ulcers on her legs which required skin grafting came in with bleeding over the posterior aspect of the distal left lower leg. Family states that the ulcer in that area has been down to the Achilles tendon. No definite bleeder is seen but there is some oozing from that site. The INR will be checked and coagulopathy that is beyond the therapeutic range will be corrected if needed. The bleeding will be controlled with the assistance of topical thrombin pad.  I saw and evaluated the patient, reviewed the resident's note and I agree with the findings and plan.   Dione Booze, MD 01/02/12 [phone]

## 2012-01-02 NOTE — Progress Notes (Signed)
Orthopedic Tech Progress Note Patient Details:  Kathryn Schroeder [birth year] [id number] Post op shoe ordered and delivered to room. Doctor to change patient's dressing and reapply. Doctor will also apply post op shoe. Ortho Devices Type of Ortho Device: Postop boot Ortho Device/Splint Location: Left LE Ortho Device/Splint Interventions: Ordered   [place] R Thompson 01/02/2012, 7:43 PM

## 2012-01-02 NOTE — ED Notes (Signed)
Updated on wait times.  Advised to notify of any change in condition or needs. 

## 2012-01-02 NOTE — ED Notes (Signed)
Pt from wound center with ruptured achilles tendon; pt a pt a wound clinic for large venous stasis ulcer to that leg with exposed tendon that ruptured today; pt sent here for eval c/o pain

## 2012-01-13 ENCOUNTER — Encounter (HOSPITAL_COMMUNITY): Payer: Self-pay | Admitting: Pharmacy Technician

## 2012-01-14 ENCOUNTER — Encounter (HOSPITAL_COMMUNITY)
Admission: RE | Admit: 2012-01-14 | Discharge: 2012-01-14 | Payer: Medicare Other | Source: Ambulatory Visit | Attending: Orthopaedic Surgery | Admitting: Orthopaedic Surgery

## 2012-01-14 ENCOUNTER — Encounter (HOSPITAL_COMMUNITY): Payer: Self-pay | Admitting: General Practice

## 2012-01-14 ENCOUNTER — Inpatient Hospital Stay (HOSPITAL_COMMUNITY)
Admission: RE | Admit: 2012-01-14 | Discharge: 2012-01-20 | DRG: 240 | Disposition: A | Discharge status: Skilled Nursing Facility | Payer: Medicare Other | Source: Ambulatory Visit | Attending: Internal Medicine | Admitting: Internal Medicine

## 2012-01-14 DIAGNOSIS — E785 Hyperlipidemia, unspecified: Secondary | ICD-10-CM | POA: Diagnosis present

## 2012-01-14 DIAGNOSIS — M79606 Pain in leg, unspecified: Secondary | ICD-10-CM | POA: Diagnosis present

## 2012-01-14 DIAGNOSIS — Z79899 Other long term (current) drug therapy: Secondary | ICD-10-CM

## 2012-01-14 DIAGNOSIS — I798 Other disorders of arteries, arterioles and capillaries in diseases classified elsewhere: Secondary | ICD-10-CM | POA: Diagnosis present

## 2012-01-14 DIAGNOSIS — Z9861 Coronary angioplasty status: Secondary | ICD-10-CM

## 2012-01-14 DIAGNOSIS — L97909 Non-pressure chronic ulcer of unspecified part of unspecified lower leg with unspecified severity: Secondary | ICD-10-CM | POA: Diagnosis present

## 2012-01-14 DIAGNOSIS — I2510 Atherosclerotic heart disease of native coronary artery without angina pectoris: Secondary | ICD-10-CM | POA: Diagnosis present

## 2012-01-14 DIAGNOSIS — I4891 Unspecified atrial fibrillation: Secondary | ICD-10-CM | POA: Diagnosis present

## 2012-01-14 DIAGNOSIS — Z96649 Presence of unspecified artificial hip joint: Secondary | ICD-10-CM

## 2012-01-14 DIAGNOSIS — E119 Type 2 diabetes mellitus without complications: Secondary | ICD-10-CM | POA: Diagnosis present

## 2012-01-14 DIAGNOSIS — E1142 Type 2 diabetes mellitus with diabetic polyneuropathy: Secondary | ICD-10-CM | POA: Diagnosis present

## 2012-01-14 DIAGNOSIS — I252 Old myocardial infarction: Secondary | ICD-10-CM

## 2012-01-14 DIAGNOSIS — E1159 Type 2 diabetes mellitus with other circulatory complications: Principal | ICD-10-CM | POA: Diagnosis present

## 2012-01-14 DIAGNOSIS — Z7982 Long term (current) use of aspirin: Secondary | ICD-10-CM

## 2012-01-14 DIAGNOSIS — I872 Venous insufficiency (chronic) (peripheral): Secondary | ICD-10-CM | POA: Diagnosis present

## 2012-01-14 DIAGNOSIS — Z7901 Long term (current) use of anticoagulants: Secondary | ICD-10-CM

## 2012-01-14 DIAGNOSIS — E1149 Type 2 diabetes mellitus with other diabetic neurological complication: Secondary | ICD-10-CM | POA: Diagnosis present

## 2012-01-14 DIAGNOSIS — Z01812 Encounter for preprocedural laboratory examination: Secondary | ICD-10-CM

## 2012-01-14 DIAGNOSIS — I10 Essential (primary) hypertension: Secondary | ICD-10-CM | POA: Diagnosis present

## 2012-01-14 DIAGNOSIS — M129 Arthropathy, unspecified: Secondary | ICD-10-CM | POA: Diagnosis present

## 2012-01-14 DIAGNOSIS — I83009 Varicose veins of unspecified lower extremity with ulcer of unspecified site: Secondary | ICD-10-CM | POA: Diagnosis present

## 2012-01-14 HISTORY — DX: Shortness of breath: R06.02

## 2012-01-14 HISTORY — DX: Unspecified osteoarthritis, unspecified site: M19.90

## 2012-01-14 LAB — CBC
Hemoglobin: 9 g/dL — ABNORMAL LOW (ref 12.0–15.0)
MCH: 30.4 pg (ref 26.0–34.0)
RBC: 2.96 MIL/uL — ABNORMAL LOW (ref 3.87–5.11)
WBC: 6.3 10*3/uL (ref 4.0–10.5)

## 2012-01-14 LAB — CREATININE, SERUM
GFR calc Af Amer: 89 mL/min — ABNORMAL LOW (ref >90)
GFR calc non Af Amer: 77 mL/min — ABNORMAL LOW (ref >90)

## 2012-01-14 MED ORDER — SODIUM CHLORIDE 0.9 % IJ SOLN
3.0000 mL | Freq: Two times a day (BID) | INTRAMUSCULAR | Status: DC
  Administered 2012-01-15 – 2012-01-19 (×5): 3 mL via INTRAVENOUS

## 2012-01-14 MED ORDER — CARVEDILOL 12.5 MG PO TABS
12.5000 mg | ORAL_TABLET | Freq: Two times a day (BID) | ORAL | Status: DC
  Administered 2012-01-15 – 2012-01-20 (×11): 12.5 mg via ORAL
  Filled 2012-01-14 (×13): qty 1

## 2012-01-14 MED ORDER — ZOLPIDEM TARTRATE 5 MG PO TABS: 5.0000 mg | ORAL_TABLET | Freq: Every evening | ORAL | Status: DC | PRN

## 2012-01-14 MED ORDER — ALUM & MAG HYDROXIDE-SIMETH 200-200-20 MG/5ML PO SUSP: 30.0000 mL | Freq: Four times a day (QID) | ORAL | Status: DC | PRN

## 2012-01-14 MED ORDER — ACETAMINOPHEN 325 MG PO TABS
650.0000 mg | ORAL_TABLET | Freq: Four times a day (QID) | ORAL | Status: DC | PRN
  Administered 2012-01-15 – 2012-01-16 (×4): 650 mg via ORAL
  Filled 2012-01-14 (×4): qty 2

## 2012-01-14 MED ORDER — INSULIN ASPART 100 UNIT/ML ~~LOC~~ SOLN
0.0000 [IU] | Freq: Three times a day (TID) | SUBCUTANEOUS | Status: DC
  Administered 2012-01-15: 3 [IU] via SUBCUTANEOUS
  Administered 2012-01-15: 2 [IU] via SUBCUTANEOUS
  Administered 2012-01-15 – 2012-01-17 (×2): 3 [IU] via SUBCUTANEOUS
  Administered 2012-01-17: 5 [IU] via SUBCUTANEOUS
  Administered 2012-01-17: 2 [IU] via SUBCUTANEOUS
  Administered 2012-01-18: 5 [IU] via SUBCUTANEOUS
  Administered 2012-01-18 (×2): 3 [IU] via SUBCUTANEOUS
  Administered 2012-01-19: 2 [IU] via SUBCUTANEOUS
  Administered 2012-01-19: 5 [IU] via SUBCUTANEOUS
  Administered 2012-01-19 – 2012-01-20 (×2): 2 [IU] via SUBCUTANEOUS
  Administered 2012-01-20: 5 [IU] via SUBCUTANEOUS

## 2012-01-14 MED ORDER — SENNOSIDES-DOCUSATE SODIUM 8.6-50 MG PO TABS
1.0000 | ORAL_TABLET | Freq: Every evening | ORAL | Status: DC | PRN
  Administered 2012-01-19: 1 via ORAL
  Filled 2012-01-14: qty 1

## 2012-01-14 MED ORDER — BRIMONIDINE TARTRATE-TIMOLOL 0.2-0.5 % OP SOLN: 1.0000 [drp] | Freq: Two times a day (BID) | OPHTHALMIC | Status: DC

## 2012-01-14 MED ORDER — BRIMONIDINE TARTRATE 0.2 % OP SOLN
1.0000 [drp] | Freq: Two times a day (BID) | OPHTHALMIC | Status: DC
  Administered 2012-01-15 – 2012-01-20 (×11): 1 [drp] via OPHTHALMIC
  Filled 2012-01-14: qty 5

## 2012-01-14 MED ORDER — LOSARTAN POTASSIUM 25 MG PO TABS
25.0000 mg | ORAL_TABLET | Freq: Every day | ORAL | Status: DC
  Administered 2012-01-15 – 2012-01-20 (×6): 25 mg via ORAL
  Filled 2012-01-14 (×6): qty 1

## 2012-01-14 MED ORDER — ENOXAPARIN SODIUM 40 MG/0.4ML ~~LOC~~ SOLN
40.0000 mg | SUBCUTANEOUS | Status: DC
  Administered 2012-01-14: 40 mg via SUBCUTANEOUS
  Filled 2012-01-14 (×2): qty 0.4

## 2012-01-14 MED ORDER — ONDANSETRON HCL 4 MG/2ML IJ SOLN
4.0000 mg | Freq: Four times a day (QID) | INTRAMUSCULAR | Status: DC | PRN
  Administered 2012-01-16: 4 mg via INTRAVENOUS
  Filled 2012-01-14: qty 2

## 2012-01-14 MED ORDER — ACETAMINOPHEN 650 MG RE SUPP: 650.0000 mg | Freq: Four times a day (QID) | RECTAL | Status: DC | PRN

## 2012-01-14 MED ORDER — ONDANSETRON HCL 4 MG PO TABS: 4.0000 mg | ORAL_TABLET | Freq: Four times a day (QID) | ORAL | Status: DC | PRN

## 2012-01-14 MED ORDER — MAGNESIUM OXIDE 400 MG PO TABS
400.0000 mg | ORAL_TABLET | Freq: Two times a day (BID) | ORAL | Status: DC
  Administered 2012-01-15 – 2012-01-20 (×11): 400 mg via ORAL
  Filled 2012-01-14 (×15): qty 1

## 2012-01-14 MED ORDER — MORPHINE SULFATE 2 MG/ML IJ SOLN
2.0000 mg | INTRAMUSCULAR | Status: DC | PRN
  Administered 2012-01-16: 2 mg via INTRAVENOUS
  Filled 2012-01-14: qty 1

## 2012-01-14 MED ORDER — TIMOLOL MALEATE 0.5 % OP SOLN
1.0000 [drp] | Freq: Two times a day (BID) | OPHTHALMIC | Status: DC
  Administered 2012-01-15 – 2012-01-20 (×11): 1 [drp] via OPHTHALMIC
  Filled 2012-01-14: qty 5

## 2012-01-14 MED ORDER — METFORMIN HCL 500 MG PO TABS
1000.0000 mg | ORAL_TABLET | Freq: Every day | ORAL | Status: DC
  Filled 2012-01-14: qty 2

## 2012-01-14 NOTE — Pre-Procedure Instructions (Signed)
20 Kathryn Schroeder  01/14/2012   Your procedure is scheduled on:  Thursday January 16, 2012 at 1330 PM  Report to [facility] at 1130 AM.  Call this number if you have problems the morning of surgery: [phone]   Remember:   Do not eat food or drink:After Midnight.Wednesday      Take these medicines the morning of surgery with A SIP OF WATER: Coreg, and Tramadol if needed   Do not wear jewelry, make-up or nail polish.  Do not wear lotions, powders, or perfumes. You may wear deodorant.  Do not shave 48 hours prior to surgery.   Do not bring valuables to the hospital.  Contacts, dentures or bridgework may not be worn into surgery.  Leave suitcase in the car. After surgery it may be brought to your room.  For patients admitted to the hospital, checkout time is 11:00 AM the day of discharge.   Patients discharged the day of surgery will not be allowed to drive home.    Special Instructions: Shower using CHG 2 nights before surgery and the night before surgery.  If you shower the day of surgery use CHG.  Use special wash - you have one bottle of CHG for all showers.  You should use approximately 1/3 of the bottle for each shower.   Please read over the following fact sheets that you were given: Pain Booklet, Coughing and Deep Breathing, MRSA Information and Surgical Site Infection Prevention

## 2012-01-14 NOTE — H&P (Signed)
PCP:   Romeo Rabon, MD   Chief Complaint:  Venous stasis ulcer  HPI: This is a 78 year old female patient of Dr. Cleophas Dunker, who also has a ruptured Achilles tendon on the left lower extremities. After much discussion and extensive treatments the team decided the patient needs an amputation. She was sent to the hospital for this. The hospitalist service has been requested to admit her provide cardiac clearance. The patient is on Coumadin outpatient she stop this since Saturday.  The patient does have a history of coronary artery disease, with remote stress test done approximately 2010. This was normal. The patient say she does not get chest pains, she was admitted here in July of 2013 with congestive heart failure. 2-D echo done at that time should the ER from 60%. Patient has no issues since. No shortness of breath, no palpitations and no chest pains. She walks with a walker, and does limited housework. She lives alone.  Review of Systems:  The patient denies anorexia, fever, weight loss,, vision loss, decreased hearing, hoarseness, chest pain, syncope, dyspnea on exertion, peripheral edema, balance deficits, hemoptysis, abdominal pain, melena, hematochezia, severe indigestion/heartburn, hematuria, incontinence, genital sores, muscle weakness, suspicious skin lesions, transient blindness, difficulty walking, depression, unusual weight change, abnormal bleeding, enlarged lymph nodes, angioedema, and breast masses.  Past Medical History: Past Medical History  Diagnosis Date  . Coronary artery disease     STATUS POST OLD INFERIOR WALL M.I.  . Chronic anticoagulation   . Atrial fibrillation   . Hypertension   . Hyperlipidemia   . MI, old 2008  . Shortness of breath   . Diabetes mellitus     TYPE 2  . Arthritis    Past Surgical History  Procedure Date  . Appendectomy   . Total hip arthroplasty     RIGHT HIP  . Abdominal hysterectomy 1976    1976  . Cataract extraction     Bilateral   . Breast biopsy 2013  . Coronary angioplasty with stent placement     Medications: Prior to Admission medications   Medication Sig Start Date End Date Taking? Authorizing Provider  acetaminophen (TYLENOL) 325 MG tablet Take 2 tablets (650 mg total) by mouth every 6 (six) hours as needed (or Fever >/= 101). 10/25/11 10/24/12 Yes Laveda Norman, MD  aspirin 81 MG tablet Take 81 mg by mouth every morning.    Yes Historical Provider, MD  brimonidine-timolol (COMBIGAN) 0.2-0.5 % ophthalmic solution Place 1 drop into both eyes 2 (two) times daily.   Yes Historical Provider, MD  carvedilol (COREG) 12.5 MG tablet Take 12.5 mg by mouth 2 (two) times daily with a meal.     Yes Historical Provider, MD  Cholecalciferol (VITAMIN D3) 400 UNITS CAPS Take 1 tablet by mouth daily.    Yes Historical Provider, MD  doxycycline (VIBRA-TABS) 100 MG tablet Take 100 mg by mouth 2 (two) times daily.   Yes Historical Provider, MD  losartan (COZAAR) 25 MG tablet Take 25 mg by mouth daily.   Yes Historical Provider, MD  magnesium oxide (MAG-OX) 400 MG tablet Take 400 mg by mouth 2 (two) times daily.     Yes Historical Provider, MD  metFORMIN (GLUCOPHAGE) 500 MG tablet Take 1,000 mg by mouth daily.    Yes Historical Provider, MD  Omega-3 Fatty Acids (FISH OIL) 1000 MG CAPS Take 1 capsule by mouth every morning.    Yes Historical Provider, MD  Red Yeast Rice Extract (RED YEAST RICE PO) Take 1 tablet by  mouth at bedtime.   Yes Historical Provider, MD  traMADol (ULTRAM) 50 MG tablet Take 50 mg by mouth every 8 (eight) hours as needed. For pain   Yes Historical Provider, MD  warfarin (COUMADIN) 5 MG tablet Take 0.5-1 tablets (2.5-5 mg total) by mouth daily. Patient takes 5 mg daily except 2.5 mg on Tuesday's and Saturday's 10/25/11   Laveda Norman, MD    Allergies:   Allergies  Allergen Reactions  . Bactrim (Sulfamethoxazole W-Trimethoprim) Hives  . Janumet (Sitagliptin-Metformin Hcl) Nausea Only, Swelling and Other (See  Comments)    weakness  . Adhesive (Tape)     WHITE tape: Causes blisters and skin reaction  . Ciprofloxacin Other (See Comments)    unknown  . Percocet (Oxycodone-Acetaminophen) Other (See Comments)    Makes her crazy  . Sulfa Antibiotics Hives    Hyperkalemia with bactrim  . Penicillins Itching and Rash    Childhood allergy:     Social History:  reports that she has never smoked. She has never used smokeless tobacco. She reports that she does not drink alcohol or use illicit drugs.  Family History: Family History  Problem Relation Age of Onset  . Cancer Father 37  . Cancer Mother   . Cancer Daughter   . Diabetes Daughter   . Hypertension Daughter   . Other Daughter     varicose veins    Physical Exam: Filed Vitals:   01/14/12 1647  BP: 142/78  Pulse: 78  Temp: 98.4 F (36.9 C)  Resp: 18  SpO2: 100%    General:  Alert and oriented times three, well developed and nourished, no acute distress Eyes: PERRLA, pink conjunctiva, no scleral icterus ENT: Moist oral mucosa, neck supple, no thyromegaly Lungs: clear to ascultation, no wheeze, no crackles, no use of accessory muscles Cardiovascular: regular rate and rhythm, no regurgitation, no gallops, no murmurs. No carotid bruits, no JVD Abdomen: soft, positive BS, non-tender, non-distended, no organomegaly, not an acute abdomen GU: not examined Neuro: CN II - XII grossly intact, sensation intact Musculoskeletal: strength 5/5 all extremities, no clubbing, cyanosis or edema, bilateral venostasis ulcer on both calf. Venous  stasis ulcer worse on the left calf Skin: no rash, no subcutaneous crepitation, no decubitus Psych: appropriate patient   Labs on Admission:   Basename 01/14/12 2007  NA --  K --  CL --  CO2 --  GLUCOSE --  BUN --  CREATININE 0.79  CALCIUM --  MG --  PHOS --   No results found for this basename: AST:2,ALT:2,ALKPHOS:2,BILITOT:2,PROT:2,ALBUMIN:2 in the last 72 hours No results found for this  basename: LIPASE:2,AMYLASE:2 in the last 72 hours  Basename 01/14/12 2007  WBC 6.3  NEUTROABS --  HGB 9.0*  HCT 27.5*  MCV 92.9  PLT 279  Results for ELLEANA, STILLSON (MRN [id number]) as of 01/14/2012 21:47  Ref. Range 01/14/2012 20:07  Prothrombin Time Latest Range: 11.6-15.2 seconds 23.0 (H)  INR Latest Range: 0.00-1.49  2.14 (H)   No results found for this basename: CKTOTAL:3,CKMB:3,CKMBINDEX:3,TROPONINI:3 in the last 72 hours No components found with this basename: POCBNP:3 No results found for this basename: DDIMER:2 in the last 72 hours No results found for this basename: HGBA1C:2 in the last 72 hours No results found for this basename: CHOL:2,HDL:2,LDLCALC:2,TRIG:2,CHOLHDL:2,LDLDIRECT:2 in the last 72 hours No results found for this basename: TSH,T4TOTAL,FREET3,T3FREE,THYROIDAB in the last 72 hours No results found for this basename: VITAMINB12:2,FOLATE:2,FERRITIN:2,TIBC:2,IRON:2,RETICCTPCT:2 in the last 72 hours  Micro Results: No results found for this  or any previous visit (from the past 240 hour(s)).   Radiological Exams on Admission: No results found.  Assessment/Plan Present on Admission:  . ruptured his Achilles tendon  .Venous stasis ulcers  patient admitted to telemetry Planned amputation   by orthopedics Dr Cleophas Dunker to be called with number  Patient appears to moderate risk for surgical intervention  [facility] consulted .Atrial fibrillation with controlled ventricular response Continue home medications for rate control per particularly Coumadin on hold  INR still therapeutic, may need FFP prior to surgery  .Diabetes mellitus type 2, controlled .Coronary artery disease  NP at midnight, IV fluid hydration, sliding scale insulin coverage    Full code  Currently no need for DVT prophylaxis   Greenley Martone 01/14/2012, 9:47 PM

## 2012-01-15 ENCOUNTER — Ambulatory Visit: Payer: Medicare Other | Admitting: Vascular Surgery

## 2012-01-15 ENCOUNTER — Inpatient Hospital Stay (HOSPITAL_COMMUNITY): Payer: Medicare Other

## 2012-01-15 DIAGNOSIS — R6889 Other general symptoms and signs: Secondary | ICD-10-CM

## 2012-01-15 LAB — URINALYSIS, ROUTINE W REFLEX MICROSCOPIC
Bilirubin Urine: NEGATIVE
Bilirubin Urine: NEGATIVE
Glucose, UA: 500 mg/dL — AB
Glucose, UA: >1000 mg/dL — AB
Ketones, ur: NEGATIVE mg/dL
Protein, ur: NEGATIVE mg/dL
Specific Gravity, Urine: 1.02 (ref 1.005–1.030)
pH: 5.5 (ref 5.0–8.0)

## 2012-01-15 LAB — URINE MICROSCOPIC-ADD ON

## 2012-01-15 LAB — CBC
HCT: 29.4 % — ABNORMAL LOW (ref 36.0–46.0)
HCT: 27.4 % — ABNORMAL LOW (ref 36.0–46.0)
Hemoglobin: 9.6 g/dL — ABNORMAL LOW (ref 12.0–15.0)
Hemoglobin: 8.9 g/dL — ABNORMAL LOW (ref 12.0–15.0)
MCHC: 32.7 g/dL (ref 30.0–36.0)
MCV: 94.8 fL (ref 78.0–100.0)
RDW: 15.3 % (ref 11.5–15.5)
WBC: 6.9 10*3/uL (ref 4.0–10.5)
WBC: 5.7 10*3/uL (ref 4.0–10.5)

## 2012-01-15 LAB — COMPREHENSIVE METABOLIC PANEL
ALT: 6 U/L (ref 0–35)
Alkaline Phosphatase: 53 U/L (ref 39–117)
Alkaline Phosphatase: 47 U/L (ref 39–117)
BUN: 19 mg/dL (ref 6–23)
CO2: 26 mEq/L (ref 19–32)
Calcium: 9.5 mg/dL (ref 8.4–10.5)
Chloride: 104 mEq/L (ref 96–112)
Chloride: 101 mEq/L (ref 96–112)
GFR calc Af Amer: 79 mL/min — ABNORMAL LOW (ref >90)
GFR calc Af Amer: 73 mL/min — ABNORMAL LOW (ref >90)
GFR calc non Af Amer: 68 mL/min — ABNORMAL LOW (ref >90)
GFR calc non Af Amer: 63 mL/min — ABNORMAL LOW (ref >90)
Glucose, Bld: 136 mg/dL — ABNORMAL HIGH (ref 70–99)
Glucose, Bld: 260 mg/dL — ABNORMAL HIGH (ref 70–99)
Potassium: 4.1 mEq/L (ref 3.5–5.1)
Sodium: 140 mEq/L (ref 135–145)
Total Bilirubin: 0.3 mg/dL (ref 0.3–1.2)
Total Bilirubin: 0.2 mg/dL — ABNORMAL LOW (ref 0.3–1.2)

## 2012-01-15 LAB — TYPE AND SCREEN: Antibody Screen: NEGATIVE

## 2012-01-15 LAB — GLUCOSE, CAPILLARY
Glucose-Capillary: 136 mg/dL — ABNORMAL HIGH (ref 70–99)
Glucose-Capillary: 146 mg/dL — ABNORMAL HIGH (ref 70–99)
Glucose-Capillary: 155 mg/dL — ABNORMAL HIGH (ref 70–99)
Glucose-Capillary: 184 mg/dL — ABNORMAL HIGH (ref 70–99)

## 2012-01-15 LAB — BASIC METABOLIC PANEL
BUN: 18 mg/dL (ref 6–23)
CO2: 26 mEq/L (ref 19–32)
Calcium: 9 mg/dL (ref 8.4–10.5)
GFR calc non Af Amer: 68 mL/min — ABNORMAL LOW (ref >90)
Glucose, Bld: 196 mg/dL — ABNORMAL HIGH (ref 70–99)
Sodium: 137 mEq/L (ref 135–145)

## 2012-01-15 LAB — PROTIME-INR
INR: 1.81 — ABNORMAL HIGH (ref 0.00–1.49)
Prothrombin Time: 20.3 seconds — ABNORMAL HIGH (ref 11.6–15.2)
Prothrombin Time: 20.3 seconds — ABNORMAL HIGH (ref 11.6–15.2)

## 2012-01-15 LAB — SURGICAL PCR SCREEN: MRSA, PCR: NEGATIVE

## 2012-01-15 MED ORDER — SODIUM CHLORIDE 0.9 % IV SOLN
INTRAVENOUS | Status: DC
  Administered 2012-01-15 – 2012-01-16 (×2): via INTRAVENOUS

## 2012-01-15 MED ORDER — PHYTONADIONE 5 MG PO TABS
5.0000 mg | ORAL_TABLET | Freq: Once | ORAL | Status: AC
  Administered 2012-01-15: 5 mg via ORAL
  Filled 2012-01-15: qty 1

## 2012-01-15 MED ORDER — CHLORHEXIDINE GLUCONATE 4 % EX LIQD
60.0000 mL | Freq: Once | CUTANEOUS | Status: DC
  Filled 2012-01-15: qty 60

## 2012-01-15 MED ORDER — METFORMIN HCL 500 MG PO TABS: 1000.0000 mg | ORAL_TABLET | Freq: Every day | ORAL | Status: DC

## 2012-01-15 MED ORDER — VANCOMYCIN HCL IN DEXTROSE 1-5 GM/200ML-% IV SOLN
1000.0000 mg | Freq: Two times a day (BID) | INTRAVENOUS | Status: DC
  Administered 2012-01-15 – 2012-01-17 (×6): 1000 mg via INTRAVENOUS
  Filled 2012-01-15 (×8): qty 200

## 2012-01-15 MED ORDER — CHLORHEXIDINE GLUCONATE 4 % EX LIQD
60.0000 mL | Freq: Every day | CUTANEOUS | Status: DC
  Administered 2012-01-15: 4 via TOPICAL
  Filled 2012-01-15 (×2): qty 60

## 2012-01-15 MED ORDER — VITAMIN K1 10 MG/ML IJ SOLN: 5.0000 mg | Freq: Once | INTRAVENOUS | Status: DC

## 2012-01-15 MED ORDER — ENOXAPARIN SODIUM 40 MG/0.4ML ~~LOC~~ SOLN: 40.0000 mg | SUBCUTANEOUS | Status: DC

## 2012-01-15 NOTE — Progress Notes (Signed)
[facility] PROGRESS NOTE  DELMA DRONE ZOX:[id number] DOB: 04-Aug-1930 DOA: 01/14/2012 PCP: Romeo Rabon, MD  Assessment/Plan: Active Problems:  Coronary artery disease  Chronic anticoagulation  Atrial fibrillation with controlled ventricular response  Diabetes mellitus type 2, controlled  Leg pain  Venous stasis ulcers    #1 ruptured his Achilles tendon  .Venous stasis ulcers  patient admitted to telemetry  Planned amputation by orthopedics Dr Cleophas Dunker to be called with number  Patient appears to moderate risk for surgical intervention  Wound care consulted   #2 Atrial fibrillation with controlled ventricular response  Continue home medications for rate control per particularly  Coumadin on hold  INR still therapeutic, may need FFP prior to surgery   . #3 Diabetes mellitus type 2, controlled, HBA1C is 7.9. CBGs remained somewhat elevated, so she is being managed with SSI/Lantus, adjusted as indicated.    #4 Coronary artery disease  NP at midnight, IV fluid hydration, sliding scale insulin coverage Had a recent 2-D echo will not repeat   #5 perioperative mortality discussed this with the patient. She  remains at high risk for perioperative morbidity and mortality given multiple cardiovascular risk factors, she remains at risk of atrial fibrillation with rapid ventricular response postoperatively, however the patient wishes to proceed with the surgery Orthopedics has been notified INR is still 1.81 We'll give 1 dose of vitamin K I anticipate that her INR will be normal tomorrow     HPI/Subjective: Stable overnight  Objective: Filed Vitals:   01/14/12 1647 01/15/12 0038 01/15/12 0637  BP: 142/78 149/64 149/67  Pulse: 78 81 75  Temp: 98.4 F (36.9 C) 98.2 F (36.8 C) 98.5 F (36.9 C)  TempSrc:  Oral Oral  Resp: 18 18 18   SpO2: 100% 98% 97%   No intake or output data in the 24 hours ending 01/15/12 1005  Exam: General: Alert and oriented  times three, well developed and nourished, no acute distress  Eyes: PERRLA, pink conjunctiva, no scleral icterus  ENT: Moist oral mucosa, neck supple, no thyromegaly  Lungs: clear to ascultation, no wheeze, no crackles, no use of accessory muscles  Cardiovascular: regular rate and rhythm, no regurgitation, no gallops, no murmurs. No carotid bruits, no JVD  Abdomen: soft, positive BS, non-tender, non-distended, no organomegaly, not an acute abdomen  GU: not examined  Neuro: CN II - XII grossly intact, sensation intact  Musculoskeletal: strength 5/5 all extremities, no clubbing, cyanosis or edema, bilateral venostasis ulcer on both calf. Venous stasis ulcer worse on the left calf  Skin: no rash, no subcutaneous crepitation, no decubitus  Psych: appropriate patient    Data Reviewed: Basic Metabolic Panel:  Lab 01/15/12 8119 01/14/12 2007  NA 140 --  K 4.0 --  CL 104 --  CO2 26 --  GLUCOSE 136* --  BUN 16 --  CREATININE 0.80 0.79  CALCIUM 9.5 --  MG -- --  PHOS -- --    Liver Function Tests:  Lab 01/15/12 0535  AST 11  ALT 6  ALKPHOS 53  BILITOT 0.3  PROT 6.3  ALBUMIN 2.9*   No results found for this basename: LIPASE:5,AMYLASE:5 in the last 168 hours No results found for this basename: AMMONIA:5 in the last 168 hours  CBC:  Lab 01/15/12 0535 01/14/12 2007  WBC 6.9 6.3  NEUTROABS -- --  HGB 9.6* 9.0*  HCT 29.4* 27.5*  MCV 93.0 92.9  PLT 297 279    Cardiac Enzymes: No results found for this basename: CKTOTAL:5,CKMB:5,CKMBINDEX:5,TROPONINI:5 in the last  168 hours BNP (last 3 results)  Basename 01/14/12 1938 10/22/11 0341  PROBNP 3478.0* 5797.0*     CBG:  Lab 01/15/12 0647 01/15/12 0041  GLUCAP 136* 184*    Recent Results (from the past 240 hour(s))  SURGICAL PCR SCREEN     Status: Normal   Collection Time   01/14/12 10:37 PM      Component Value Range Status Comment   MRSA, PCR NEGATIVE  NEGATIVE Final    Staphylococcus aureus NEGATIVE  NEGATIVE  Final      Studies: Ct Angio Ao+bifem W/cm &/or Wo/cm  12/17/2011  **ADDENDUM** CREATED: 12/17/2011 14:34:41  Patchy densities in the right upper lung, best seen on sequence #4, image 20, have a branching configuration.  These findings most likely represent a post inflammatory or infectious etiology.  **END ADDENDUM** SIGNED BY: Richarda Overlie, M.D.   12/16/2011  *RADIOLOGY REPORT*  Clinical Data:  Nonhealing leg wounds with peripheral vascular disease.  CT ANGIOGRAPHY OF ABDOMINAL AORTA WITH ILIOFEMORAL RUNOFF  Technique:  Multidetector CT imaging of the abdomen, pelvis and lower extremities was performed using the standard protocol during bolus administration of intravenous contrast.  Multiplanar CT image reconstructions including MIPs were obtained to evaluate the vascular anatomy.  Contrast: OMNIPAQUE IOHEXOL 350 MG/ML SOLN  Comparison:  Abdominal CT 01/15/2007  Findings:  Aorta:  The abdominal aorta and visceral vessels are heavily calcified.  There is flow in the celiac trunk, SMA and bilateral renal arteries.  There is at least mild stenosis involving the proximal SMA.  There appears to be stenosis involving the proximal renal arteries bilaterally.  No evidence for aortic aneurysmal dilatation.  The infrarenal abdominal aorta measures 2.2 cm.  Right Lower Extremity:  The right common, external and internal iliac arteries are patent without significant stenosis.  Right common femoral artery is widely patent.  Right profunda femoral artery is patent.  There is at least mild narrowing in the distal right SFA, best seen on sequence #10, image 348.  The right SFA is patent.  Right popliteal artery is patent.  Limited evaluation of the right runoff vessels.  The proximal runoff vessels are heavily calcified.  It is very difficult to evaluate the patency of the runoff vessels due to the calcification and small size of the vessels.  There appears to be some flow within the posterior tibial artery and the  possibly dorsalis pedis artery.  Left Lower Extremity:  The left iliac arteries are patent.  Left common femoral artery is patent.  Left profunda femoral arteries are patent.  Circumferential calcifications involving the left superficial femoral artery which is patent.  There is extensive edema in the left lower extremity and enlarged and superficial veins are suggestive for varices.  The left popliteal artery is small but appears to be patent.  There is concern for an occlusion in the left tibioperoneal trunk.  Runoff vessels are heavily calcified.  There appears to be some flow within the runoff vessels, especially the anterior tibial artery.  There is probably segmental stenoses involving the posterior tibial and peroneal artery.  The deep venous structures in the left lower extremity appear to be patent.  Nonvascular structures:  Majority of the chest was evaluated.  The patient has an enlarged and lobular thyroid tissue with multiple scattered calcifications and low density nodules.  Findings are consistent with multinodular goiter with substernal extension.  The native coronary arteries are heavily calcified.  Ascending thoracic aorta is prominent and measuring up to 3.8 cm.  No evidence to suggest an aortic dissection.  No significant pericardial or pleural fluid.  Lung windows demonstrate patchy ground-glass opacities in the left lower lobe, probably related to atelectasis and volume loss.  There are few patchy branching structures in the lungs, for instance sequence #4, image 20.  These findings are nonspecific and do not clearly represent focal nodules.  There is stable nodularity involving the left adrenal gland which most likely represents an adenoma.  No gross abnormality to the right adrenal gland.  There is evidence for bilateral renal cortical cysts.  High dense material in the gallbladder is suggestive for gallstones.  No gross abnormality to the spleen or pancreas. Incidentally, there is a  retroaortic left renal vein.  Uterus has been removed.  Fluid in the urinary bladder.  There is laxity or hernia along the ventral lower abdomen which contains some bowel structures.  No evidence to suggest bowel obstruction.  Evaluation of the bones demonstrates diffuse osteopenia.  The patient has a right hip replacement.  Both hips are located.   Review of the MIP images confirms the above findings.  IMPRESSION: The patient has diffuse atherosclerotic disease.  There is no significant stenosis involving the aorta or iliac vessels.  There is mild-moderate focal stenosis involving the distal right SFA.  There is bilateral runoff disease.  Evaluation of the runoff vessels is very limited due to the small size of the vessels and the atherosclerotic calcifications.  There is concern for a focal occlusion involving the left tibioperoneal trunk.  Diffuse subcutaneous edema involving the left lower extremity.  In addition, there are enlarged superficial veins in the left lower extremity.  Findings could represent left lower extremity venous insufficiency and could be contributing to the patient's leg wounds.  Cholelithiasis.  Multinodular goiter.   Original Report Authenticated By: Richarda Overlie, M.D.     Scheduled Meds:   . brimonidine  1 drop Both Eyes BID  . carvedilol  12.5 mg Oral BID WC  . enoxaparin (LOVENOX) injection  40 mg Subcutaneous Q24H  . insulin aspart  0-15 Units Subcutaneous TID WC  . losartan  25 mg Oral Daily  . magnesium oxide  400 mg Oral BID  . sodium chloride  3 mL Intravenous Q12H  . timolol  1 drop Both Eyes BID  . vancomycin  1,000 mg Intravenous Q12H  . DISCONTD: brimonidine-timolol  1 drop Both Eyes BID  . DISCONTD: enoxaparin (LOVENOX) injection  40 mg Subcutaneous Q24H  . DISCONTD: metFORMIN  1,000 mg Oral Daily  . DISCONTD: metFORMIN  1,000 mg Oral Q breakfast   Continuous Infusions:   Active Problems:  Coronary artery disease  Chronic anticoagulation  Atrial  fibrillation with controlled ventricular response  Diabetes mellitus type 2, controlled  Leg pain  Venous stasis ulcers    Time spent: 40 minutes   [facility]  [facility] Pager [phone]. If 8PM-8AM, please contact night-coverage at [email or web address], password [facility] 01/15/2012, 10:05 AM  LOS: 1 day

## 2012-01-15 NOTE — Progress Notes (Signed)
ANTIBIOTIC CONSULT NOTE - INITIAL  Pharmacy Consult for vancomycin Indication: empiric (venous stasis ulcers)  Allergies  Allergen Reactions  . Bactrim (Sulfamethoxazole W-Trimethoprim) Hives  . Janumet (Sitagliptin-Metformin Hcl) Nausea Only, Swelling and Other (See Comments)    weakness  . Adhesive (Tape)     WHITE tape: Causes blisters and skin reaction  . Ciprofloxacin Other (See Comments)    unknown  . Percocet (Oxycodone-Acetaminophen) Other (See Comments)    Makes her crazy  . Sulfa Antibiotics Hives    Hyperkalemia with bactrim  . Penicillins Itching and Rash    Childhood allergy:     Patient Measurements: weight 88kg    Vital Signs: Temp: 98.5 F (36.9 C) (10/09 0637) Temp src: Oral (10/09 0637) BP: 149/67 mmHg (10/09 0637) Pulse Rate: 75  (10/09 0637) Intake/Output from previous day:   Intake/Output from this shift:    Labs:  Basename 01/15/12 0535 01/14/12 2007  WBC 6.9 6.3  HGB 9.6* 9.0*  PLT 297 279  LABCREA -- --  CREATININE 0.80 0.79   The CrCl is unknown because both a height and weight (above a minimum accepted value) are required for this calculation. No results found for this basename: VANCOTROUGH:2,VANCOPEAK:2,VANCORANDOM:2,GENTTROUGH:2,GENTPEAK:2,GENTRANDOM:2,TOBRATROUGH:2,TOBRAPEAK:2,TOBRARND:2,AMIKACINPEAK:2,AMIKACINTROU:2,AMIKACIN:2, in the last 72 hours   Microbiology: Recent Results (from the past 720 hour(s))  SURGICAL PCR SCREEN     Status: Normal   Collection Time   01/14/12 10:37 PM      Component Value Range Status Comment   MRSA, PCR NEGATIVE  NEGATIVE Final    Staphylococcus aureus NEGATIVE  NEGATIVE Final     Medical History: Past Medical History  Diagnosis Date  . Coronary artery disease     STATUS POST OLD INFERIOR WALL M.I.  . Chronic anticoagulation   . Atrial fibrillation   . Hypertension   . Hyperlipidemia   . MI, old 2008  . Shortness of breath   . Diabetes mellitus     TYPE 2  . Arthritis      Medications:  Prescriptions prior to admission  Medication Sig Dispense Refill  . acetaminophen (TYLENOL) 325 MG tablet Take 2 tablets (650 mg total) by mouth every 6 (six) hours as needed (or Fever >/= 101).  100 tablet  0  . aspirin 81 MG tablet Take 81 mg by mouth every morning.       . brimonidine-timolol (COMBIGAN) 0.2-0.5 % ophthalmic solution Place 1 drop into both eyes 2 (two) times daily.      . carvedilol (COREG) 12.5 MG tablet Take 12.5 mg by mouth 2 (two) times daily with a meal.        . Cholecalciferol (VITAMIN D3) 400 UNITS CAPS Take 1 tablet by mouth daily.       Marland Kitchen doxycycline (VIBRA-TABS) 100 MG tablet Take 100 mg by mouth 2 (two) times daily.      Marland Kitchen losartan (COZAAR) 25 MG tablet Take 25 mg by mouth daily.      . magnesium oxide (MAG-OX) 400 MG tablet Take 400 mg by mouth 2 (two) times daily.        . metFORMIN (GLUCOPHAGE) 500 MG tablet Take 1,000 mg by mouth daily.       . Omega-3 Fatty Acids (FISH OIL) 1000 MG CAPS Take 1 capsule by mouth every morning.       . Red Yeast Rice Extract (RED YEAST RICE PO) Take 1 tablet by mouth at bedtime.      . traMADol (ULTRAM) 50 MG tablet Take 50 mg by mouth  every 8 (eight) hours as needed. For pain      . warfarin (COUMADIN) 5 MG tablet Take 0.5-1 tablets (2.5-5 mg total) by mouth daily. Patient takes 5 mg daily except 2.5 mg on Tuesday's and Saturday's  30 tablet  0   Assessment: Patient is a 78 y.o F on coumadin PTA for chronic afib and it's currently on hold in anticipation for surgical intervention for ruptured achilles tendon.  Patient was on doxycycline PTA. To start vancomycin for empiric coverage d/t venous stasis ulcers (per Jacqualine Code)  Goal of Therapy:  Vancomycin trough level 10-15 mcg/ml  Plan:  1) vancomycin 1gm q12h   Hadrian Yarbrough P 01/15/2012,9:09 AM

## 2012-01-15 NOTE — Consult Note (Signed)
WOC consult Note Reason for Consult: bil. Venous stasis ulcerations.  Pt has long hx of venous stasis ulceration, tx per wound care center in [place], [place]. She has been admitted for amputation on the left, for this reason I will not assess this wound today.  I have taken a look at the right lateral ulceration.  She does not have significant edema in either LE.  She has 4 layer compression wrap in place on the right LE. Wound type: venous stasis bil. LE Measurement: 6.0cm x 4.0cm x 0.2cm  Wound bed: pink, moist with some yellow film present Drainage (amount, consistency, odor) heavy, yellow brown drainage Periwound: intact with some scarring and reepithelialization at wound edges Dressing procedure/placement/frequency: I have place absorbant hydrofiber dressing over wound with foam topper to absorb excess exudate.  I wrapped her with dry boot (kerlix and coban) for light compression for now, her family reports that ortho will be also looking at the right LE wound in OR tom, for that reason I did not place her back in the 4 layer wrap.  I will order Profore 4 layer compression wrap and replace after surgical intervention.   WOC will follow along with you Severn Goddard Marlena Clipper, CWOCN [phone]

## 2012-01-16 ENCOUNTER — Inpatient Hospital Stay (HOSPITAL_COMMUNITY): Payer: Medicare Other | Admitting: Anesthesiology

## 2012-01-16 ENCOUNTER — Encounter (HOSPITAL_COMMUNITY): Payer: Self-pay | Admitting: Anesthesiology

## 2012-01-16 ENCOUNTER — Encounter (HOSPITAL_COMMUNITY)
Admission: RE | Disposition: A | Discharge status: Skilled Nursing Facility | Payer: Self-pay | Source: Ambulatory Visit | Attending: Internal Medicine

## 2012-01-16 HISTORY — PX: AMPUTATION: SHX166

## 2012-01-16 LAB — CBC
MCH: 30.7 pg (ref 26.0–34.0)
MCV: 93.2 fL (ref 78.0–100.0)
Platelets: 247 10*3/uL (ref 150–400)
RDW: 15.1 % (ref 11.5–15.5)
WBC: 6.7 10*3/uL (ref 4.0–10.5)

## 2012-01-16 LAB — GLUCOSE, CAPILLARY
Glucose-Capillary: 193 mg/dL — ABNORMAL HIGH (ref 70–99)
Glucose-Capillary: 169 mg/dL — ABNORMAL HIGH (ref 70–99)

## 2012-01-16 SURGERY — AMPUTATION BELOW KNEE
Anesthesia: General | Site: Leg Lower | Laterality: Left | Wound class: Clean

## 2012-01-16 MED ORDER — ONDANSETRON HCL 4 MG/2ML IJ SOLN
4.0000 mg | Freq: Four times a day (QID) | INTRAMUSCULAR | Status: DC | PRN
  Filled 2012-01-16: qty 2

## 2012-01-16 MED ORDER — METOCLOPRAMIDE HCL 5 MG/ML IJ SOLN: 5.0000 mg | Freq: Three times a day (TID) | INTRAMUSCULAR | Status: DC | PRN

## 2012-01-16 MED ORDER — ACETAMINOPHEN 10 MG/ML IV SOLN
1000.0000 mg | Freq: Once | INTRAVENOUS | Status: AC
  Administered 2012-01-16: 1000 mg via INTRAVENOUS
  Filled 2012-01-16: qty 100

## 2012-01-16 MED ORDER — HYDROMORPHONE HCL PF 1 MG/ML IJ SOLN
2.0000 mg | INTRAMUSCULAR | Status: DC | PRN
  Administered 2012-01-16 – 2012-01-17 (×4): 0.5 mg via INTRAVENOUS
  Filled 2012-01-16 (×5): qty 1

## 2012-01-16 MED ORDER — ACETAMINOPHEN 10 MG/ML IV SOLN
INTRAVENOUS | Status: AC
  Filled 2012-01-16: qty 100

## 2012-01-16 MED ORDER — WARFARIN SODIUM 5 MG PO TABS
5.0000 mg | ORAL_TABLET | Freq: Once | ORAL | Status: AC
  Administered 2012-01-16: 5 mg via ORAL
  Filled 2012-01-16: qty 1

## 2012-01-16 MED ORDER — HYDROMORPHONE HCL PF 1 MG/ML IJ SOLN
0.2500 mg | INTRAMUSCULAR | Status: DC | PRN
  Administered 2012-01-16 (×2): 0.5 mg via INTRAVENOUS

## 2012-01-16 MED ORDER — ONDANSETRON HCL 4 MG/2ML IJ SOLN: 4.0000 mg | Freq: Once | INTRAMUSCULAR | Status: AC | PRN

## 2012-01-16 MED ORDER — VECURONIUM BROMIDE 10 MG IV SOLR
INTRAVENOUS | Status: DC | PRN
  Administered 2012-01-16: 4 mg via INTRAVENOUS

## 2012-01-16 MED ORDER — FENTANYL CITRATE 0.05 MG/ML IJ SOLN
INTRAMUSCULAR | Status: DC | PRN
  Administered 2012-01-16 (×6): 50 ug via INTRAVENOUS

## 2012-01-16 MED ORDER — ONDANSETRON HCL 4 MG/2ML IJ SOLN
INTRAMUSCULAR | Status: DC | PRN
  Administered 2012-01-16: 4 mg via INTRAVENOUS

## 2012-01-16 MED ORDER — METOCLOPRAMIDE HCL 10 MG PO TABS: 5.0000 mg | ORAL_TABLET | Freq: Three times a day (TID) | ORAL | Status: DC | PRN

## 2012-01-16 MED ORDER — ENOXAPARIN SODIUM 40 MG/0.4ML ~~LOC~~ SOLN
40.0000 mg | SUBCUTANEOUS | Status: DC
  Administered 2012-01-17 – 2012-01-20 (×4): 40 mg via SUBCUTANEOUS
  Filled 2012-01-16 (×5): qty 0.4

## 2012-01-16 MED ORDER — WARFARIN - PHARMACIST DOSING INPATIENT
Freq: Every day | Status: DC
  Administered 2012-01-17 – 2012-01-19 (×3)

## 2012-01-16 MED ORDER — 0.9 % SODIUM CHLORIDE (POUR BTL) OPTIME
TOPICAL | Status: DC | PRN
  Administered 2012-01-16: 1000 mL

## 2012-01-16 MED ORDER — PHENYLEPHRINE HCL 10 MG/ML IJ SOLN
INTRAMUSCULAR | Status: DC | PRN
  Administered 2012-01-16 (×2): 40 ug via INTRAVENOUS

## 2012-01-16 MED ORDER — PROPOFOL 10 MG/ML IV BOLUS
INTRAVENOUS | Status: DC | PRN
  Administered 2012-01-16: 60 mg via INTRAVENOUS
  Administered 2012-01-16: 20 mg via INTRAVENOUS

## 2012-01-16 MED ORDER — DOCUSATE SODIUM 100 MG PO CAPS
100.0000 mg | ORAL_CAPSULE | Freq: Two times a day (BID) | ORAL | Status: DC
  Administered 2012-01-17 – 2012-01-20 (×7): 100 mg via ORAL
  Filled 2012-01-16 (×10): qty 1

## 2012-01-16 MED ORDER — GLYCOPYRROLATE 0.2 MG/ML IJ SOLN
INTRAMUSCULAR | Status: DC | PRN
  Administered 2012-01-16: 0.4 mg via INTRAVENOUS

## 2012-01-16 MED ORDER — LIDOCAINE HCL 4 % MT SOLN
OROMUCOSAL | Status: DC | PRN
  Administered 2012-01-16: 4 mL via TOPICAL

## 2012-01-16 MED ORDER — WARFARIN VIDEO
Freq: Once | Status: AC
  Administered 2012-01-17: 15:00:00

## 2012-01-16 MED ORDER — HYDROMORPHONE HCL PF 1 MG/ML IJ SOLN
INTRAMUSCULAR | Status: AC
  Administered 2012-01-16: 0.5 mg
  Filled 2012-01-16: qty 1

## 2012-01-16 MED ORDER — ONDANSETRON HCL 4 MG PO TABS
4.0000 mg | ORAL_TABLET | Freq: Four times a day (QID) | ORAL | Status: DC | PRN
  Administered 2012-01-20: 4 mg via ORAL
  Filled 2012-01-16: qty 1

## 2012-01-16 MED ORDER — NEOSTIGMINE METHYLSULFATE 1 MG/ML IJ SOLN
INTRAMUSCULAR | Status: DC | PRN
  Administered 2012-01-16: 3 mg via INTRAVENOUS

## 2012-01-16 MED ORDER — COUMADIN BOOK
Freq: Once | Status: AC
  Administered 2012-01-16: 21:00:00
  Filled 2012-01-16: qty 1

## 2012-01-16 MED ORDER — LACTATED RINGERS IV SOLN
INTRAVENOUS | Status: DC | PRN
  Administered 2012-01-16: 13:00:00 via INTRAVENOUS

## 2012-01-16 MED ORDER — LIDOCAINE HCL (CARDIAC) 20 MG/ML IV SOLN
INTRAVENOUS | Status: DC | PRN
  Administered 2012-01-16: 80 mg via INTRAVENOUS

## 2012-01-16 MED ORDER — ACETAMINOPHEN 10 MG/ML IV SOLN
1000.0000 mg | Freq: Four times a day (QID) | INTRAVENOUS | Status: AC
  Administered 2012-01-16 – 2012-01-17 (×4): 1000 mg via INTRAVENOUS
  Filled 2012-01-16 (×5): qty 100

## 2012-01-16 MED ORDER — LACTATED RINGERS IV SOLN
INTRAVENOUS | Status: DC
  Administered 2012-01-16: 13:00:00 via INTRAVENOUS

## 2012-01-16 SURGICAL SUPPLY — 60 items
BANDAGE ELASTIC 4 VELCRO ST LF (GAUZE/BANDAGES/DRESSINGS) ×2 IMPLANT
BANDAGE ELASTIC 6 VELCRO ST LF (GAUZE/BANDAGES/DRESSINGS) ×2 IMPLANT
BANDAGE ESMARK 6X9 LF (GAUZE/BANDAGES/DRESSINGS) IMPLANT
BANDAGE GAUZE ELAST BULKY 4 IN (GAUZE/BANDAGES/DRESSINGS) ×6 IMPLANT
BLADE SAW SAG 73X25 THK (BLADE) ×1
BLADE SAW SGTL 73X25 THK (BLADE) ×1 IMPLANT
BNDG CMPR 9X6 STRL LF SNTH (GAUZE/BANDAGES/DRESSINGS)
BNDG COHESIVE 6X5 TAN STRL LF (GAUZE/BANDAGES/DRESSINGS) ×2 IMPLANT
BNDG ESMARK 6X9 LF (GAUZE/BANDAGES/DRESSINGS)
CLOTH BEACON ORANGE TIMEOUT ST (SAFETY) ×2 IMPLANT
COVER SURGICAL LIGHT HANDLE (MISCELLANEOUS) ×2 IMPLANT
CUFF TOURNIQUET SINGLE 34IN LL (TOURNIQUET CUFF) ×2 IMPLANT
CUFF TOURNIQUET SINGLE 44IN (TOURNIQUET CUFF) IMPLANT
DRAIN PENROSE 1/2X12 LTX STRL (WOUND CARE) IMPLANT
DRAPE EXTREMITY BILATERAL (DRAPE) ×2 IMPLANT
DRAPE EXTREMITY T 121X128X90 (DRAPE) ×2 IMPLANT
DRAPE PROXIMA HALF (DRAPES) ×4 IMPLANT
DRAPE U-SHAPE 47X51 STRL (DRAPES) ×2 IMPLANT
DRSG ADAPTIC 3X8 NADH LF (GAUZE/BANDAGES/DRESSINGS) ×2 IMPLANT
DRSG MEPILEX BORDER 4X8 (GAUZE/BANDAGES/DRESSINGS) ×2 IMPLANT
DURAPREP 26ML APPLICATOR (WOUND CARE) ×2 IMPLANT
EVACUATOR 1/8 PVC DRAIN (DRAIN) IMPLANT
GLOVE BIOGEL PI IND STRL 7.0 (GLOVE) ×1 IMPLANT
GLOVE BIOGEL PI IND STRL 8 (GLOVE) ×1 IMPLANT
GLOVE BIOGEL PI IND STRL 8.5 (GLOVE) IMPLANT
GLOVE BIOGEL PI INDICATOR 7.0 (GLOVE) ×1
GLOVE BIOGEL PI INDICATOR 8 (GLOVE) ×1
GLOVE BIOGEL PI INDICATOR 8.5 (GLOVE)
GLOVE ECLIPSE 8.0 STRL XLNG CF (GLOVE) ×2 IMPLANT
GLOVE SURG ORTHO 8.5 STRL (GLOVE) IMPLANT
GLOVE SURG SS PI 6.5 STRL IVOR (GLOVE) ×2 IMPLANT
GOWN PREVENTION PLUS XLARGE (GOWN DISPOSABLE) ×2 IMPLANT
GOWN STRL NON-REIN LRG LVL3 (GOWN DISPOSABLE) ×4 IMPLANT
KIT BASIN OR (CUSTOM PROCEDURE TRAY) ×2 IMPLANT
KIT ROOM TURNOVER OR (KITS) ×2 IMPLANT
MANIFOLD NEPTUNE II (INSTRUMENTS) ×2 IMPLANT
NS IRRIG 1000ML POUR BTL (IV SOLUTION) ×2 IMPLANT
PACK GENERAL/GYN (CUSTOM PROCEDURE TRAY) ×2 IMPLANT
PAD ARMBOARD 7.5X6 YLW CONV (MISCELLANEOUS) ×4 IMPLANT
PAD CAST 4YDX4 CTTN HI CHSV (CAST SUPPLIES) ×1 IMPLANT
PADDING CAST COTTON 4X4 STRL (CAST SUPPLIES) ×2
PADDING CAST COTTON 6X4 STRL (CAST SUPPLIES) ×2 IMPLANT
SAW GIGLI STERILE 20 (MISCELLANEOUS) ×2 IMPLANT
SPONGE GAUZE 4X4 12PLY (GAUZE/BANDAGES/DRESSINGS) ×2 IMPLANT
SPONGE LAP 18X18 X RAY DECT (DISPOSABLE) IMPLANT
STAPLER VISISTAT 35W (STAPLE) ×2 IMPLANT
STOCKINETTE IMPERVIOUS LG (DRAPES) IMPLANT
SUT BONE WAX W31G (SUTURE) ×2 IMPLANT
SUT ETHILON 3 0 FSLX (SUTURE) ×2 IMPLANT
SUT SILK 2 0 SH (SUTURE) ×4 IMPLANT
SUT VIC AB 0 CT1 27 (SUTURE) ×4
SUT VIC AB 0 CT1 27XBRD ANBCTR (SUTURE) ×2 IMPLANT
SUT VIC AB 1 CT1 27 (SUTURE) ×8
SUT VIC AB 1 CT1 27XBRD ANBCTR (SUTURE) ×4 IMPLANT
SUT VIC AB 1 CTB1 27 (SUTURE) ×2 IMPLANT
SUT VIC AB 2-0 FS1 27 (SUTURE) ×6 IMPLANT
SUT VICRYL AB 2 0 TIES (SUTURE) ×2 IMPLANT
TOWEL OR 17X24 6PK STRL BLUE (TOWEL DISPOSABLE) ×2 IMPLANT
TOWEL OR 17X26 10 PK STRL BLUE (TOWEL DISPOSABLE) ×2 IMPLANT
WATER STERILE IRR 1000ML POUR (IV SOLUTION) ×2 IMPLANT

## 2012-01-16 NOTE — Preoperative (Signed)
Beta Blockers   Reason not to administer Beta Blockers:Not Applicable 

## 2012-01-16 NOTE — Progress Notes (Signed)
[facility] PROGRESS NOTE  Kathryn Schroeder:[id number] DOB: Jun 04, 1930 DOA: 01/14/2012 PCP: Romeo Rabon, MD  Assessment/Plan: Active Problems:  Coronary artery disease  Chronic anticoagulation  Atrial fibrillation with controlled ventricular response  Diabetes mellitus type 2, controlled  Leg pain  Venous stasis ulcers    *#1 ruptured his Achilles tendon  .Venous stasis ulcers  patient admitted to telemetry  Planned amputation by orthopedics Dr Cleophas Dunker , he apparently saw the patient early yesterday morning Anticipated surgery today Patient appears to moderate risk for surgical intervention  Wound care consulted    #2 Atrial fibrillation with controlled ventricular response  Continue home medications for rate control per particularly  Coumadin on hold  INR 1.32, Coumadin on hold Status post vitamin K yesterday  . #3 Diabetes mellitus type 2, controlled,  HBA1C is 7.9. CBGs remained somewhat elevated, so she is being managed with SSI/Lantus, adjusted as indicated. Metformin on hold  #4 Coronary artery disease/stable/asymptomatic  Continue aspirin postoperatively, Coreg  Had a recent 2-D echo will not repeat     #5 perioperative mortality discussed this with the patient. She remains at high risk for perioperative morbidity and mortality given multiple cardiovascular risk factors, she remains at risk of atrial fibrillation with rapid ventricular response postoperatively, however the patient wishes to proceed with the surgery  Orthopedics has been notified  INR is 1.3 2    HPI/Subjective:  Stable overnight  Objective: Filed Vitals:   01/15/12 0637 01/15/12 1227 01/15/12 2200 01/16/12 0538  BP: 149/67 130/62 143/64 140/67  Pulse: 75 76 74 81  Temp: 98.5 F (36.9 C) 97.9 F (36.6 C) 98.1 F (36.7 C) 98.2 F (36.8 C)  TempSrc: Oral     Resp: 18 18 16 16   SpO2: 97% 100% 100% 97%    Intake/Output Summary (Last 24 hours) at 01/16/12 0950 Last  data filed at 01/15/12 1200  Gross per 24 hour  Intake    480 ml  Output      0 ml  Net    480 ml    Exam:  General: Alert and oriented times three, well developed and nourished, no acute distress  Eyes: PERRLA, pink conjunctiva, no scleral icterus  ENT: Moist oral mucosa, neck supple, no thyromegaly  Lungs: clear to ascultation, no wheeze, no crackles, no use of accessory muscles  Cardiovascular: regular rate and rhythm, no regurgitation, no gallops, no murmurs. No carotid bruits, no JVD  Abdomen: soft, positive BS, non-tender, non-distended, no organomegaly, not an acute abdomen  GU: not examined  Neuro: CN II - XII grossly intact, sensation intact  Musculoskeletal: strength 5/5 all extremities, no clubbing, cyanosis or edema, bilateral venostasis ulcer on both calf. Venous stasis ulcer worse on the left calf  Skin: no rash, no subcutaneous crepitation, no decubitus  Psych: appropriate patient    Data Reviewed: Basic Metabolic Panel:  Lab 01/15/12 9562 01/15/12 1113 01/15/12 0535 01/14/12 2007  NA 136 137 140 --  K 4.1 4.0 4.0 --  CL 101 102 104 --  CO2 27 26 26  --  GLUCOSE 260* 196* 136* --  BUN 19 18 16  --  CREATININE 0.86 0.80 0.80 0.79  CALCIUM 9.1 9.0 9.5 --  MG -- -- -- --  PHOS -- -- -- --    Liver Function Tests:  Lab 01/15/12 1337 01/15/12 0535  AST 11 11  ALT 6 6  ALKPHOS 47 53  BILITOT 0.2* 0.3  PROT 5.7* 6.3  ALBUMIN 2.6* 2.9*   No results found  for this basename: LIPASE:5,AMYLASE:5 in the last 168 hours No results found for this basename: AMMONIA:5 in the last 168 hours  CBC:  Lab 01/16/12 0450 01/15/12 1337 01/15/12 0535 01/14/12 2007  WBC 6.7 5.7 6.9 6.3  NEUTROABS -- -- -- --  HGB 9.0* 8.9* 9.6* 9.0*  HCT 27.3* 27.4* 29.4* 27.5*  MCV 93.2 94.8 93.0 92.9  PLT 247 252 297 279    Cardiac Enzymes:  Lab 01/15/12 1113  CKTOTAL --  CKMB --  CKMBINDEX --  TROPONINI <0.30   BNP (last 3 results)  Basename 01/14/12 1938 10/22/11 0341    PROBNP 3478.0* 5797.0*     CBG:  Lab 01/16/12 0640 01/15/12 2358 01/15/12 2155 01/15/12 1620 01/15/12 1131  GLUCAP 153* 146* 164* 178* 155*    Recent Results (from the past 240 hour(s))  SURGICAL PCR SCREEN     Status: Normal   Collection Time   01/14/12 10:37 PM      Component Value Range Status Comment   MRSA, PCR NEGATIVE  NEGATIVE Final    Staphylococcus aureus NEGATIVE  NEGATIVE Final      Studies: Dg Chest Port 1 View  01/15/2012  *RADIOLOGY REPORT*  Clinical Data: Preoperative respiratory exam.  Hypertension and diabetes.  PORTABLE CHEST - 1 VIEW  Comparison: 10/21/2011  Findings: Artifact overlies chest.  Heart size is normal.  Lungs are clear.  There is chronic elevation of the right hemidiaphragm. There is chronic right paratracheal prominence related to intrathoracic extension of a thyroid goiter.  IMPRESSION: No active disease.  Chronic elevated right hemidiaphragm.  Chronic intrathoracic extension of thyroid goiter.   Original Report Authenticated By: Thomasenia Sales, M.D.     Scheduled Meds:   . brimonidine  1 drop Both Eyes BID  . carvedilol  12.5 mg Oral BID WC  . chlorhexidine  60 mL Topical Q2000  . chlorhexidine  60 mL Topical Once  . insulin aspart  0-15 Units Subcutaneous TID WC  . losartan  25 mg Oral Daily  . magnesium oxide  400 mg Oral BID  . phytonadione  5 mg Oral Once  . sodium chloride  3 mL Intravenous Q12H  . timolol  1 drop Both Eyes BID  . vancomycin  1,000 mg Intravenous Q12H  . DISCONTD: enoxaparin (LOVENOX) injection  40 mg Subcutaneous Q24H  . DISCONTD: enoxaparin (LOVENOX) injection  40 mg Subcutaneous Q24H  . DISCONTD: metFORMIN  1,000 mg Oral Q breakfast  . DISCONTD: phytonadione (VITAMIN K) IV  5 mg Intravenous Once   Continuous Infusions:   . sodium chloride 75 mL/hr at 01/16/12 9562    Active Problems:  Coronary artery disease  Chronic anticoagulation  Atrial fibrillation with controlled ventricular response  Diabetes  mellitus type 2, controlled  Leg pain  Venous stasis ulcers    Time spent: 40 minutes   [facility]  [facility] Pager [phone]. If 8PM-8AM, please contact night-coverage at [email or web address], password [facility] 01/16/2012, 9:50 AM  LOS: 2 days

## 2012-01-16 NOTE — Anesthesia Procedure Notes (Signed)
Procedure Name: Intubation Date/Time: 01/16/2012 2:02 PM Performed by: Lovie Chol Pre-anesthesia Checklist: Patient identified, Emergency Drugs available, Patient being monitored and Timeout performed Patient Re-evaluated:Patient Re-evaluated prior to inductionOxygen Delivery Method: Circle system utilized Preoxygenation: Pre-oxygenation with 100% oxygen Intubation Type: IV induction Ventilation: Mask ventilation without difficulty Laryngoscope Size: Miller and 2 Grade View: Grade I Tube type: Oral Tube size: 7.5 mm Number of attempts: 1 Airway Equipment and Method: Stylet and LTA kit utilized Placement Confirmation: ETT inserted through vocal cords under direct vision,  positive ETCO2 and breath sounds checked- equal and bilateral Secured at: 21 cm Tube secured with: Tape Dental Injury: Teeth and Oropharynx as per pre-operative assessment

## 2012-01-16 NOTE — Op Note (Signed)
PATIENT ID:      Kathryn Schroeder  MRN:     [id number] DOB/AGE:    06/09/1930 / 78 y.o.       OPERATIVE REPORT    DATE OF PROCEDURE:  01/16/2012       PREOPERATIVE DIAGNOSIS:   Non-healing Ulcers with Vascular Insufficiency (PVD) Left Leg, DIABETIC PERIPHERAL VASCULAR DISEASE                                                       Estimated Body mass index is 29.65 kg/(m^2) as calculated from the following:   Height as of 12/16/11: 5\' 8" (1.727 m).   Weight as of 12/16/11: 195 lb(88.451 kg).     POSTOPERATIVE DIAGNOSIS:   non-healing ulcers with vascular insufficiency bilateral legs ,SAME                                                                    Estimated Body mass index is 29.65 kg/(m^2) as calculated from the following:   Height as of 12/16/11: 5\' 8" (1.727 m).   Weight as of 12/16/11: 195 lb(88.451 kg).     PROCEDURE:  Procedure(s): AMPUTATION BELOW KNEE left     SURGEON:  [name], MD    ASSISTANT:   Jacqualine Code, PA-C   (Present and scrubbed throughout the case, critical for assistance with exposure, retraction, instrumentation, and closure.)          ANESTHESIA: none     DRAINS: none :      TOURNIQUET TIME: * No tourniquets in log *    COMPLICATIONS:  None   CONDITION:  stable  PROCEDURE IN [id number]   Kathryn Schroeder, PETER W 01/16/2012, 3:46 PM

## 2012-01-16 NOTE — Anesthesia Postprocedure Evaluation (Signed)
  Anesthesia Post-op Note  Patient: Kathryn Schroeder  Procedure(s) Performed: Procedure(s) (LRB) with comments: AMPUTATION BELOW KNEE (Left) - Left Below Knee Amputation  Patient Location: PACU  Anesthesia Type: General  Level of Consciousness: awake  Airway and Oxygen Therapy: Patient Spontanous Breathing  Post-op Pain: mild  Post-op Assessment: Post-op Vital signs reviewed  Post-op Vital Signs: stable  Complications: No apparent anesthesia complications

## 2012-01-16 NOTE — Progress Notes (Signed)
ANTICOAGULATION and ANTIBIOTIC CONSULT NOTE - Initial Consult  Pharmacy Consult for Coumadin and Vancomycin Indication:  AFib, Empiric coverage post-op  Allergies  Allergen Reactions  . Bactrim (Sulfamethoxazole W-Trimethoprim) Hives  . Janumet (Sitagliptin-Metformin Hcl) Nausea Only, Swelling and Other (See Comments)    weakness  . Adhesive (Tape)     WHITE tape: Causes blisters and skin reaction  . Ciprofloxacin Other (See Comments)    unknown  . Percocet (Oxycodone-Acetaminophen) Other (See Comments)    Makes her crazy  . Sulfa Antibiotics Hives    Hyperkalemia with bactrim  . Penicillins Itching and Rash    Childhood allergy:     Patient Measurements:   Heparin Dosing Weight:   Vital Signs: Temp: 97.2 F (36.2 C) (10/10 1653) Temp src: Oral (10/10 1225) BP: 163/77 mmHg (10/10 1639) Pulse Rate: 64  (10/10 1653)  Labs:  Basename 01/16/12 0450 01/15/12 1834 01/15/12 1337 01/15/12 1113 01/15/12 0535  HGB 9.0* -- 8.9* -- --  HCT 27.3* -- 27.4* -- 29.4*  PLT 247 -- 252 -- 297  APTT -- -- 39* -- --  LABPROT 16.1* 18.4* 20.3* -- --  INR 1.32 1.58* 1.81* -- --  HEPARINUNFRC -- -- -- -- --  CREATININE -- -- 0.86 0.80 0.80  CKTOTAL -- -- -- -- --  CKMB -- -- -- -- --  TROPONINI -- -- -- <0.30 --    The CrCl is unknown because both a height and weight (above a minimum accepted value) are required for this calculation.   Medical History: Past Medical History  Diagnosis Date  . Coronary artery disease     STATUS POST OLD INFERIOR WALL M.I.  . Chronic anticoagulation   . Atrial fibrillation   . Hypertension   . Hyperlipidemia   . MI, old 2008  . Shortness of breath   . Diabetes mellitus     TYPE 2  . Arthritis     Medications:  Scheduled:    . acetaminophen  1,000 mg Intravenous Once  . acetaminophen  1,000 mg Intravenous Q6H  . brimonidine  1 drop Both Eyes BID  . carvedilol  12.5 mg Oral BID WC  . docusate sodium  100 mg Oral BID  . enoxaparin  (LOVENOX) injection  40 mg Subcutaneous Q24H  . HYDROmorphone      . insulin aspart  0-15 Units Subcutaneous TID WC  . losartan  25 mg Oral Daily  . magnesium oxide  400 mg Oral BID  . sodium chloride  3 mL Intravenous Q12H  . timolol  1 drop Both Eyes BID  . vancomycin  1,000 mg Intravenous Q12H  . DISCONTD: chlorhexidine  60 mL Topical Q2000  . DISCONTD: chlorhexidine  60 mL Topical Once    Assessment: 78yo female s/p L-BKA to resume Coumadin which she takes for Atrial Fibrillation and to continue Vancomycin for empiric coverage.  Her home dose of Coumadin is 5mg  daily, except 2.5mg  on Tue & Sat.  INR = 1.32 this AM after held since Saturday in preparation for OR; she also received Vit K on 10/9.  Pt has been receiving Vancomycin 1000mg  IV q12 due to several venous stasis ulcers and on Doxycycline pta; now to continue Vanc post-op.  Will plan to check steady state trough if Vanc to continue for several days.  Goal of Therapy:  INR 2-3 and Vancomycin Trough 10-15 mcg/dL Monitor platelets by anticoagulation protocol: Yes   Plan:  1.  Coumadin 5mg   2.  Daily protime 3.  Coumadin book and video 4.  Continue Vancomycin 1000mg  IV q12, next dose 10PM.  Marisue Humble, PharmD Clinical Pharmacist [facility]

## 2012-01-16 NOTE — Transfer of Care (Signed)
Immediate Anesthesia Transfer of Care Note  Patient: Kathryn Schroeder  Procedure(s) Performed: Procedure(s) (LRB) with comments: AMPUTATION BELOW KNEE (Left) - Left Below Knee Amputation  Patient Location: PACU  Anesthesia Type: General  Level of Consciousness: awake and alert   Airway & Oxygen Therapy: Patient Spontanous Breathing and Patient connected to nasal cannula oxygen  Post-op Assessment: Report given to PACU RN and Post -op Vital signs reviewed and stable  Post vital signs: Reviewed  Complications: No apparent anesthesia complications

## 2012-01-16 NOTE — Progress Notes (Signed)
Patient ID: Kathryn Schroeder, female   DOB: 1930-10-10, 78 y.o.   MRN: [id number] The recent History & Physical has been reviewed. I have personally examined the patient today. There is no interval change to the documented History & Physical. The patient would like to proceed with the procedure.I have discussed the procedure in detail with Mrs Spade and her family in detail and they are in agreement. They are aware of the potential problems and limitations.Pleas refer to my office note for further detail. Luka Stohr W 01/16/2012,  1:52 PM

## 2012-01-16 NOTE — Progress Notes (Signed)
Inpatient Diabetes Program Recommendations  AACE/ADA: New Consensus Statement on Inpatient Glycemic Control (2013)  Target Ranges:  Prepandial:   less than 140 mg/dL      Peak postprandial:   less than 180 mg/dL (1-2 hours)      Critically ill patients:  140 - 180 mg/dL   Reason for Visit: Note history of diabetes.  A1C in July was 7.9%.   Results for Kathryn, Schroeder (MRN [id number]) as of 01/16/2012 13:05  Ref. Range 01/15/2012 16:20 01/15/2012 21:55 01/15/2012 23:58 01/16/2012 06:40 01/16/2012 12:30  Glucose-Capillary Latest Range: 70-99 mg/dL 409 (H) 811 (H) 914 (H) 153 (H) 193 (H)   Consider rechecking A1C.  Also consider adding Lantus 10 units daily.

## 2012-01-16 NOTE — Progress Notes (Signed)
UR COMPLETED  

## 2012-01-16 NOTE — Anesthesia Preprocedure Evaluation (Addendum)
Anesthesia Evaluation  Patient identified by MRN, date of birth, ID band Patient confused    Reviewed: Allergy & Precautions, H&P , NPO status , Patient's Chart, lab work & pertinent test results  Airway Mallampati: I TM Distance: >3 FB Neck ROM: full    Dental  (+) Edentulous Upper, Edentulous Lower and Dental Advidsory Given   Pulmonary shortness of breath,          Cardiovascular hypertension, + CAD, + Past MI, + Cardiac Stents and + Peripheral Vascular Disease + dysrhythmias Atrial Fibrillation Rhythm:regular Rate:Normal     Neuro/Psych    GI/Hepatic   Endo/Other  diabetes, Type 2, Oral Hypoglycemic Agents  Renal/GU Dialysis, ESRF and CRFRenal disease     Musculoskeletal   Abdominal   Peds  Hematology   Anesthesia Other Findings   Reproductive/Obstetrics                         Anesthesia Physical Anesthesia Plan  ASA: IV  Anesthesia Plan: General   Post-op Pain Management:    Induction: Intravenous  Airway Management Planned: Oral ETT  Additional Equipment:   Intra-op Plan:   Post-operative Plan: Extubation in OR and Possible Post-op intubation/ventilation  Informed Consent: I have reviewed the patients History and Physical, chart, labs and discussed the procedure including the risks, benefits and alternatives for the proposed anesthesia with the patient or authorized representative who has indicated his/her understanding and acceptance.   Dental Advisory Given  Plan Discussed with: CRNA, Anesthesiologist and Surgeon  Anesthesia Plan Comments:        Anesthesia Quick Evaluation

## 2012-01-17 ENCOUNTER — Encounter (HOSPITAL_COMMUNITY): Payer: Self-pay | Admitting: Orthopaedic Surgery

## 2012-01-17 DIAGNOSIS — S88119A Complete traumatic amputation at level between knee and ankle, unspecified lower leg, initial encounter: Secondary | ICD-10-CM

## 2012-01-17 DIAGNOSIS — I739 Peripheral vascular disease, unspecified: Secondary | ICD-10-CM

## 2012-01-17 DIAGNOSIS — L98499 Non-pressure chronic ulcer of skin of other sites with unspecified severity: Secondary | ICD-10-CM

## 2012-01-17 LAB — PROTIME-INR: Prothrombin Time: 15.7 seconds — ABNORMAL HIGH (ref 11.6–15.2)

## 2012-01-17 LAB — COMPREHENSIVE METABOLIC PANEL
ALT: 7 U/L (ref 0–35)
Alkaline Phosphatase: 41 U/L (ref 39–117)
BUN: 13 mg/dL (ref 6–23)
CO2: 26 mEq/L (ref 19–32)
Calcium: 8.7 mg/dL (ref 8.4–10.5)
GFR calc Af Amer: >90 mL/min (ref >90)
GFR calc non Af Amer: 78 mL/min — ABNORMAL LOW (ref >90)
Glucose, Bld: 145 mg/dL — ABNORMAL HIGH (ref 70–99)
Potassium: 4.3 mEq/L (ref 3.5–5.1)
Sodium: 137 mEq/L (ref 135–145)

## 2012-01-17 LAB — CBC
HCT: 24.9 % — ABNORMAL LOW (ref 36.0–46.0)
Hemoglobin: 8.1 g/dL — ABNORMAL LOW (ref 12.0–15.0)
MCH: 30.3 pg (ref 26.0–34.0)
RBC: 2.67 MIL/uL — ABNORMAL LOW (ref 3.87–5.11)

## 2012-01-17 LAB — URINE CULTURE: Colony Count: >=100000

## 2012-01-17 LAB — GLUCOSE, CAPILLARY
Glucose-Capillary: 164 mg/dL — ABNORMAL HIGH (ref 70–99)
Glucose-Capillary: 135 mg/dL — ABNORMAL HIGH (ref 70–99)
Glucose-Capillary: 182 mg/dL — ABNORMAL HIGH (ref 70–99)

## 2012-01-17 MED ORDER — TRAMADOL HCL 50 MG PO TABS
50.0000 mg | ORAL_TABLET | Freq: Four times a day (QID) | ORAL | Status: DC | PRN
  Administered 2012-01-17 – 2012-01-20 (×6): 50 mg via ORAL
  Filled 2012-01-17 (×6): qty 1

## 2012-01-17 MED ORDER — WARFARIN SODIUM 5 MG PO TABS
5.0000 mg | ORAL_TABLET | Freq: Once | ORAL | Status: AC
  Administered 2012-01-17: 5 mg via ORAL
  Filled 2012-01-17: qty 1

## 2012-01-17 NOTE — Progress Notes (Signed)
Patient ID: Kathryn Schroeder, female   DOB: Jul 26, 1930, 78 y.o.   MRN: [id number] PATIENT ID: Kathryn Schroeder        MRN:  [id number]          DOB/AGE: October 07, 1930 / 78 y.o.    Kathryn Campbell, MD   Jacqualine Code, PA-C [address] [place], [place]  [address]                             [phone]   PROGRESS NOTE  Subjective:  negative for Chest Pain  negative for Shortness of Breath  negative for Nausea/Vomiting   negative for Calf Pain    Tolerating Diet: yes         Patient reports pain as moderate.     Had a good night.  Just started having pain this AM.  Medicated  Objective: Vital signs in last 24 hours:   Patient Vitals for the past 24 hrs:  BP Temp Temp src Pulse Resp SpO2  01/17/12 0646 153/68 mmHg 98 F (36.7 C) - 66  16  100 %  01/16/12 2025 154/66 mmHg 97.7 F (36.5 C) - 71  18  97 %  01/16/12 1827 154/52 mmHg 97.5 F (36.4 C) Axillary 65  18  96 %  01/16/12 1653 - 97.2 F (36.2 C) - 64  19  100 %  01/16/12 1643 - - - 64  20  100 %  01/16/12 1639 163/77 mmHg - - - - -  01/16/12 1625 172/84 mmHg - - 66  23  100 %  01/16/12 1615 - - - 59  15  100 %  01/16/12 1608 156/60 mmHg - - - - -  01/16/12 1550 142/65 mmHg 97.7 F (36.5 C) - 61  18  100 %  01/16/12 1225 138/65 mmHg 98.5 F (36.9 C) Oral 76  17  98 %      Intake/Output from previous day:   10/10 0701 - 10/11 0700 In: 650 [I.V.:650] Out: 1    Intake/Output this shift:       Intake/Output      10/10 0701 - 10/11 0700 10/11 0701 - 10/12 0700   P.O.     I.V. 650    Total Intake 650    Stool 1    Total Output 1    Net +649         Urine Occurrence 1 x       LABORATORY DATA:  Basename 01/17/12 0530 01/16/12 0450 01/28/12 1337 01/28/12 0535 01/14/12 2007  WBC 7.7 6.7 5.7 6.9 6.3  HGB 8.1* 9.0* 8.9* 9.6* 9.0*  HCT 24.9* 27.3* 27.4* 29.4* 27.5*  PLT 234 247 252 297 279    Basename 01/17/12 0530 28-Jan-2012 1337 01/28/2012 1113 28-Jan-2012 0535 01/14/12 2007  NA 137 136 137 140 --  K  4.3 4.1 4.0 4.0 --  CL 103 101 102 104 --  CO2 26 27 26 26  --  BUN 13 19 18 16  --  CREATININE 0.77 0.86 0.80 0.80 0.79  GLUCOSE 145* 260* 196* 136* --  CALCIUM 8.7 9.1 9.0 9.5 --   Lab Results  Component Value Date   INR 1.28 01/17/2012   INR 1.32 01/16/2012   INR 1.58* 01/28/12    Recent Radiographic Studies :  Dg Chest Port 1 View  28-Jan-2012  *RADIOLOGY REPORT*  Clinical Data: Preoperative respiratory exam.  Hypertension and diabetes.  PORTABLE CHEST - 1  VIEW  Comparison: 10/21/2011  Findings: Artifact overlies chest.  Heart size is normal.  Lungs are clear.  There is chronic elevation of the right hemidiaphragm. There is chronic right paratracheal prominence related to intrathoracic extension of a thyroid goiter.  IMPRESSION: No active disease.  Chronic elevated right hemidiaphragm.  Chronic intrathoracic extension of thyroid goiter.   Original Report Authenticated By: Thomasenia Sales, M.D.      Examination:  General appearance: alert, cooperative and mild distress  Wound Exam: N/A   Drainage:  None on dressing  Motor Exam: Able to SLR  Sensory Exam:  N/A L leg  Vascular Exam: N/A  Assessment:    1 Day Post-Op  Procedure(s) (LRB): AMPUTATION BELOW KNEE (Left)  ADDITIONAL DIAGNOSIS:  Active Problems:  Coronary artery disease  Chronic anticoagulation  Atrial fibrillation with controlled ventricular response  Diabetes mellitus type 2, controlled  Leg pain  Venous stasis ulcers  Diabetes   Plan: Physical Therapy as ordered Non Weight Bearing (NWB) left leg  DVT Prophylaxis:  Lovenox and Coumadin  DISCHARGE PLAN: Inpatient Rehab  vs SNF  DISCHARGE NEEDS: HHPT, HHRN, Walker and 3-in-1 comode seat  Await InPt rehab verification.  Will change dressing next 2-3 days        [facility] 01/17/2012 8:45 AM

## 2012-01-17 NOTE — Progress Notes (Signed)
Patient will benefit from an inpt rehab admission prior to d/c home. I will follow up Monday to assist in admitting to inpt rehab once patient medically ready. Please call me with any questions. I have left a message for SW of our recommendation. [phone].

## 2012-01-17 NOTE — Progress Notes (Signed)
Orthopedic Tech Progress Note Patient Details:  Kathryn Schroeder [birth year] [id number]  Patient ID: [name], female   DOB: 12-11-1930, 78 y.o.   MRN: [id number]   [name] 01/17/2012, 11:14 AM Trapeze bar

## 2012-01-17 NOTE — Consult Note (Signed)
Physical Medicine and Rehabilitation Consult Reason for Consult: Left BKA Referring Physician: [facility]   HPI: Kathryn Schroeder is a 78 y.o. right-handed female with history of coronary artery disease, atrial fibrillation with chronic Coumadin therapy, diabetes mellitus with peripheral neuropathy and chronic venous stasis ulcerations to lower extremities. Kathryn Schroeder lives alone with with family only able to provide intermittent supervision. Patient has been followed at the wound care center in [facility] for ischemic wounds to the lower extremities. Presented 01/14/2012 with nonhealing ulcerations to left lower extremity. No change with conservative care and underwent left below knee amputation 01/16/2012 per Dr. Cleophas Dunker. Postoperative pain management. Chronic Coumadin has since been resumed for history of atrial fibrillation. Physical and occupational therapy evaluations are pending. M.D. has requested physical medicine rehabilitation consult to consider inpatient rehabilitation services   Review of Systems  Cardiovascular: Positive for leg swelling. Negative for chest pain.  Gastrointestinal: Positive for constipation.  Musculoskeletal: Positive for joint pain.  All other systems reviewed and are negative.   Past Medical History  Diagnosis Date  . Coronary artery disease     STATUS POST OLD INFERIOR WALL M.I.  . Chronic anticoagulation   . Atrial fibrillation   . Hypertension   . Hyperlipidemia   . MI, old 2008  . Shortness of breath   . Diabetes mellitus     TYPE 2  . Arthritis    Past Surgical History  Procedure Date  . Appendectomy   . Total hip arthroplasty     RIGHT HIP  . Abdominal hysterectomy 1976    1976  . Cataract extraction     Bilateral  . Breast biopsy 2013  . Coronary angioplasty with stent placement    Family History  Problem Relation Age of Onset  . Cancer Father 79  . Cancer Mother   . Cancer Daughter   . Diabetes Daughter   .  Hypertension Daughter   . Other Daughter     varicose veins   Social History:  reports that she has never smoked. She has never used smokeless tobacco. She reports that she does not drink alcohol or use illicit drugs. Allergies:  Allergies  Allergen Reactions  . Bactrim (Sulfamethoxazole W-Trimethoprim) Hives  . Janumet (Sitagliptin-Metformin Hcl) Nausea Only, Swelling and Other (See Comments)    weakness  . Adhesive (Tape)     WHITE tape: Causes blisters and skin reaction  . Ciprofloxacin Other (See Comments)    unknown  . Percocet (Oxycodone-Acetaminophen) Other (See Comments)    Makes her crazy  . Sulfa Antibiotics Hives    Hyperkalemia with bactrim  . Penicillins Itching and Rash    Childhood allergy:    Medications Prior to Admission  Medication Sig Dispense Refill  . acetaminophen (TYLENOL) 325 MG tablet Take 2 tablets (650 mg total) by mouth every 6 (six) hours as needed (or Fever >/= 101).  100 tablet  0  . aspirin 81 MG tablet Take 81 mg by mouth every morning.       . brimonidine-timolol (COMBIGAN) 0.2-0.5 % ophthalmic solution Place 1 drop into both eyes 2 (two) times daily.      . carvedilol (COREG) 12.5 MG tablet Take 12.5 mg by mouth 2 (two) times daily with a meal.        . Cholecalciferol (VITAMIN D3) 400 UNITS CAPS Take 1 tablet by mouth daily.       Marland Kitchen doxycycline (VIBRA-TABS) 100 MG tablet Take 100 mg by mouth 2 (two) times daily.      Marland Kitchen  losartan (COZAAR) 25 MG tablet Take 25 mg by mouth daily.      . magnesium oxide (MAG-OX) 400 MG tablet Take 400 mg by mouth 2 (two) times daily.        . metFORMIN (GLUCOPHAGE) 500 MG tablet Take 1,000 mg by mouth daily.       . Omega-3 Fatty Acids (FISH OIL) 1000 MG CAPS Take 1 capsule by mouth every morning.       . Red Yeast Rice Extract (RED YEAST RICE PO) Take 1 tablet by mouth at bedtime.      . traMADol (ULTRAM) 50 MG tablet Take 50 mg by mouth every 8 (eight) hours as needed. For pain      . warfarin (COUMADIN) 5 MG  tablet Take 0.5-1 tablets (2.5-5 mg total) by mouth daily. Patient takes 5 mg daily except 2.5 mg on Tuesday's and Saturday's  30 tablet  0    Home:    Functional History:   Functional Status:  Mobility:          ADL:    Cognition: Cognition Orientation Level: Oriented X4    Blood pressure 154/66, pulse 71, temperature 97.7 F (36.5 C), temperature source Axillary, resp. rate 18, SpO2 97.00%. Physical Exam  Vitals reviewed. HENT:  Head: Normocephalic.  Eyes:       Pupils round and reactive to light  Neck: Neck supple. No thyromegaly present.  Cardiovascular:       Cardiac rate controlled  Pulmonary/Chest: Breath sounds normal. She has no wheezes.  Abdominal: Bowel sounds are normal. She exhibits no distension. There is no tenderness.  Neurological:       Patient resting comfortably and easy to arouse. She followed two-step commands. She was able to provide her name date of birth and age. ue's 5/5. RLE grossly 3-4/5, LLE not tested for strength.  Skin:       Surgical site clean and dry. Note ischemic changes to right lower extremity, leg in unna wrap  Psychiatric: She has a normal mood and affect.    Results for orders placed during the hospital encounter of 01/14/12 (from the past 24 hour(s))  GLUCOSE, CAPILLARY     Status: Abnormal   Collection Time   01/16/12  6:40 AM      Component Value Range   Glucose-Capillary 153 (*) 70 - 99 mg/dL   Comment 1 Notify RN    GLUCOSE, CAPILLARY     Status: Abnormal   Collection Time   01/16/12 12:30 PM      Component Value Range   Glucose-Capillary 193 (*) 70 - 99 mg/dL   Comment 1 Documented in Chart     Comment 2 Notify RN    GLUCOSE, CAPILLARY     Status: Abnormal   Collection Time   01/16/12  3:52 PM      Component Value Range   Glucose-Capillary 169 (*) 70 - 99 mg/dL   Dg Chest Port 1 View  01/15/2012  *RADIOLOGY REPORT*  Clinical Data: Preoperative respiratory exam.  Hypertension and diabetes.  PORTABLE CHEST -  1 VIEW  Comparison: 10/21/2011  Findings: Artifact overlies chest.  Heart size is normal.  Lungs are clear.  There is chronic elevation of the right hemidiaphragm. There is chronic right paratracheal prominence related to intrathoracic extension of a thyroid goiter.  IMPRESSION: No active disease.  Chronic elevated right hemidiaphragm.  Chronic intrathoracic extension of thyroid goiter.   Original Report Authenticated By: Thomasenia Sales, M.D.  Assessment/Plan: Diagnosis: left BKA 1. Does the need for close, 24 hr/day medical supervision in concert with the patient's rehab needs make it unreasonable for this patient to be served in a less intensive setting? Yes 2. Co-Morbidities requiring supervision/potential complications: cad, afib, dm,htn 3. Due to bladder management, bowel management, safety, skin/wound care, disease management, medication administration, pain management and patient education, does the patient require 24 hr/day rehab nursing? Yes 4. Does the patient require coordinated care of a physician, rehab nurse, PT (1-2 hrs/day, 5 days/week) and OT (1-2 hrs/day, 5 days/week) to address physical and functional deficits in the context of the above medical diagnosis(es)? Yes Addressing deficits in the following areas: balance, endurance, locomotion, strength, transferring, bowel/bladder control, bathing, dressing, feeding, grooming, toileting and psychosocial support 5. Can the patient actively participate in an intensive therapy program of at least 3 hrs of therapy per day at least 5 days per week? Yes 6. The potential for patient to make measurable gains while on inpatient rehab is good 7. Anticipated functional outcomes upon discharge from inpatient rehab are supervision to mod I with PT, mod I to min assist with OT, n/a with SLP. 8. Estimated rehab length of stay to reach the above functional goals is: 2 weeks 9. Does the patient have adequate social supports to accommodate these  discharge functional goals? Potentially 10. Anticipated D/C setting: Home vs other venue 11. Anticipated post D/C treatments: HH therapy 12. Overall Rehab/Functional Prognosis: excellent  RECOMMENDATIONS: This patient's condition is appropriate for continued rehabilitative care in the following setting: CIR Patient has agreed to participate in recommended program. Yes Note that insurance prior authorization may be required for reimbursement for recommended care.  Comment: Pt is quite motivated. She was living independently PTA and would like to maximize her functional outcome. Family is quite involved. She has multiple medical issues which bear close oversight while she rehabs as well. Rehab RN to follow up.   Ivory Broad, MD     01/17/2012

## 2012-01-17 NOTE — Evaluation (Signed)
Occupational Therapy Evaluation Patient Details Name: Kathryn Schroeder MRN: [id number] DOB: Nov 18, 1930 Today's Date: 01/17/2012 Time: 4098-1191 OT Time Calculation (min): 25 min  OT Assessment / Plan / Recommendation Clinical Impression  78 yo female admitted for LT BKA that could benefit from CIR at d/c. Pt progressing well for initial evaluation and could benefit from skilled OT acutely. Pt has strong family support.    OT Assessment  Patient needs continued OT Services    Follow Up Recommendations  Inpatient Rehab    Barriers to Discharge  (lives alone but has family)    Equipment Recommendations  Rolling walker with 5" wheels;3 in 1 bedside comode;Wheelchair (measurements);Wheelchair cushion (measurements)    Recommendations for Other Services Rehab consult  Frequency  Min 2X/week    Precautions / Restrictions Precautions Precautions: Other (comment) (positioning to prevent contractures) Restrictions Weight Bearing Restrictions: Yes LLE Weight Bearing: Non weight bearing Other Position/Activity Restrictions: no pillow under thigh/knee   Pertinent Vitals/Pain 3-4 out 10 Repositioned in chair Educated using teach back for pillow placement    ADL  Eating/Feeding: Performed;Supervision/safety Where Assessed - Eating/Feeding: Chair Toilet Transfer: Simulated;+2 Total assistance Toilet Transfer: Patient Percentage: 30% Toilet Transfer Method: Stand pivot Acupuncturist: Raised toilet seat with arms (or 3-in-1 over toilet) Equipment Used: Gait belt;Rolling walker Transfers/Ambulation Related to ADLs: Pt required v/c for hand placement and sequence. Pt stand pivot to the right side with teach back education on always transfering to the right. Pt required facilitation at the hips to rotate truck to the right for the pivot transfer. pt with forward flexion at hips and pushing RW too far ahead to allow UB to (A) with upright posture ADL Comments: Pt agreeable to  OOB and eager to start therapy. Pt educated on touching residual limb to decr phatom pains. pt educated on exercises hip flexion, knee extension, glut and chair push ups at this time. Pt return demonstrated using teach back method. pt provided handouts for all education. Pt educated on LT residual limb wrapping every 4 hours to ensure that wrap is tight and reason for the wrapping to control edema, shaping and decr phatom pain. Pt acknowledged understanding that this was important. Pt completed x2 sit<>stand with slight LT lean due to Lt residual limb and recognized balanace deficits. Pt is excellent CIR candidate due to family support and motivation.    OT Diagnosis: Generalized weakness;Acute pain  OT Problem List: Decreased strength;Decreased activity tolerance;Impaired balance (sitting and/or standing);Decreased safety awareness;Decreased knowledge of use of DME or AE;Decreased knowledge of precautions;Pain OT Treatment Interventions: Self-care/ADL training;Therapeutic exercise;DME and/or AE instruction;Therapeutic activities;Patient/family education;Balance training   OT Goals Acute Rehab OT Goals OT Goal Formulation: With patient Time For Goal Achievement: 01/31/12 Potential to Achieve Goals: Good ADL Goals Pt Will Perform Grooming: with set-up;Sitting at sink;Supported ADL Goal: Grooming - Progress: Goal set today Pt Will Perform Upper Body Bathing: with set-up;Sitting at sink ADL Goal: Upper Body Bathing - Progress: Goal set today Pt Will Perform Lower Body Bathing: with min assist;Sit to stand from chair;with adaptive equipment ADL Goal: Lower Body Bathing - Progress: Goal set today Pt Will Perform Upper Body Dressing: with set-up;Sitting, chair ADL Goal: Upper Body Dressing - Progress: Goal set today Pt Will Perform Lower Body Dressing: with min assist;Sit to stand from chair;with adaptive equipment ADL Goal: Lower Body Dressing - Progress: Goal set today Pt Will Transfer to Toilet:  with min assist;Stand pivot transfer;3-in-1 ADL Goal: Toilet Transfer - Progress: Goal set today Miscellaneous OT Goals  Miscellaneous OT Goal #1: Pt will complete bed mobility Supervision level as precursor for adls OT Goal: Miscellaneous Goal #1 - Progress: Goal set today Miscellaneous OT Goal #2: Pt will return demonstrate HEP (amputee exercise- glut, quad, chair push up, hip flexion and knee extension) independent level  OT Goal: Miscellaneous Goal #2 - Progress: Goal set today  Visit Information  Last OT Received On: 01/17/12 Assistance Needed: +2 PT/OT Co-Evaluation/Treatment: Yes    Subjective Data  Subjective: "i dont call that a holiday"- pt doesnt believe in Halloween Patient Stated Goal: to get back to doing for myself   Prior Functioning     Home Living Lives With: Alone Type of Home: House Home Access: Stairs to enter [employer] of Steps: 12 Entrance Stairs-Rails: Can reach both Home Layout: One level (water dryer/ freezer with extra food) Bathroom Shower/Tub: Walk-in shower;Curtain Bathroom Toilet: Handicapped height Bathroom Accessibility: Yes How Accessible: Accessible via walker Home Adaptive Equipment: Grab bars in shower;Bedside commode/3-in-1;Walker - four wheeled;Reacher Prior Function Level of Independence: Independent Able to Take Stairs?: Yes Driving: Yes (several months ago) Vocation: Retired Musician: No difficulties Dominant Hand: Right         Vision/Perception     Cognition  Overall Cognitive Status: Appears within functional limits for tasks assessed/performed Arousal/Alertness: Awake/alert Orientation Level: Appears intact for tasks assessed Behavior During Session: [facility] for tasks performed    Extremity/Trunk Assessment Right Upper Extremity Assessment RUE ROM/Strength/Tone: WFL for tasks assessed RUE Sensation: WFL - Light Touch RUE Coordination: WFL - gross/fine motor Left Upper Extremity  Assessment LUE ROM/Strength/Tone: WFL for tasks assessed LUE Sensation: WFL - Light Touch LUE Coordination: WFL - gross/fine motor Right Lower Extremity Assessment RLE ROM/Strength/Tone: WFL for tasks assessed RLE Sensation: History of peripheral neuropathy;Deficits Left Lower Extremity Assessment LLE ROM/Strength/Tone: Deficits;Unable to fully assess;Due to precautions;Due to pain LLE Sensation: Deficits Trunk Assessment Trunk Assessment: Normal     Mobility Bed Mobility Bed Mobility: Rolling Left;Left Sidelying to Sit;Sitting - Scoot to Edge of Bed Rolling Left: 4: Min assist;With rail Left Sidelying to Sit: HOB flat;3: Mod assist Supine to Sit: 3: Mod assist;HOB flat;With rails Sitting - Scoot to Edge of Bed: 4: Min assist Details for Bed Mobility Assistance: VC's provided for technique, hand placement, and body positioning; assist provided for trunk support; VC's for maintiaining contact with floor with RLE upon sitting Transfers Transfers: Sit to Stand;Stand to Sit Sit to Stand: 1: +2 Total assist;3: Mod assist;From bed;From elevated surface Sit to Stand: Patient Percentage: 50% Stand to Sit: 1: +2 Total assist;3: Mod assist;With upper extremity assist;To bed;To chair/3-in-1 Stand to Sit: Patient Percentage: 50% Details for Transfer Assistance: VC's for upright posture; and wbing through RLE; manual assist to mainatin upright and to perform pivot; manual cues for hand placement      Shoulder Instructions     Exercise Amputee Exercises Quad Sets: Strengthening;Left;5 reps (2-3 second holds; VCs and manual cues required) Gluteal Sets: Strengthening (VC's for technique) Hip Flexion/Marching: AROM;Left;5 reps Knee Extension: AROM;Left;5 reps;Seated Straight Leg Raises: Strengthening;Left;Other reps (comment) (3-5 reps performed intermittantly throughout session) Chair Push Up: Strengthening;Seated (VC's for technique )   Balance Balance Balance Assessed: Yes Static Standing  Balance Static Standing - Balance Support: Bilateral upper extremity supported;During functional activity Static Standing - Level of Assistance: 1: +2 Total assist;Patient percentage (comment) (50%)   End of Session OT - End of Session Activity Tolerance: Patient tolerated treatment well Patient left: in chair;with call bell/phone within reach Nurse Communication: Mobility status;Precautions  GO     Harrel Carina [facility] 01/17/2012, 10:41 AMPager: [phone]

## 2012-01-17 NOTE — Progress Notes (Signed)
ANTICOAGULATION and ANTIBIOTIC CONSULT NOTE - Follow up Consult  Pharmacy Consult for Coumadin and Vancomycin Indication:  VTE prophylaxis/AFib; Empiric coverage post-op  Allergies  Allergen Reactions  . Bactrim (Sulfamethoxazole W-Trimethoprim) Hives  . Janumet (Sitagliptin-Metformin Hcl) Nausea Only, Swelling and Other (See Comments)    weakness  . Adhesive (Tape)     WHITE tape: Causes blisters and skin reaction  . Ciprofloxacin Other (See Comments)    unknown  . Percocet (Oxycodone-Acetaminophen) Other (See Comments)    Makes her crazy  . Sulfa Antibiotics Hives    Hyperkalemia with bactrim  . Penicillins Itching and Rash    Childhood allergy:     Patient Measurements:   Vital Signs: Temp: 98 F (36.7 C) (10/11 0646) BP: 153/68 mmHg (10/11 0646) Pulse Rate: 66  (10/11 0646)  Labs:  Basename 01/17/12 0530 01/16/12 0450 01/15/12 1834 01/15/12 1337 01/15/12 1113  HGB 8.1* 9.0* -- -- --  HCT 24.9* 27.3* -- 27.4* --  PLT 234 247 -- 252 --  APTT -- -- -- 39* --  LABPROT 15.7* 16.1* 18.4* -- --  INR 1.28 1.32 1.58* -- --  HEPARINUNFRC -- -- -- -- --  CREATININE 0.77 -- -- 0.86 0.80  CKTOTAL -- -- -- -- --  CKMB -- -- -- -- --  TROPONINI -- -- -- -- <0.30    The CrCl is unknown because both a height and weight (above a minimum accepted value) are required for this calculation.   Assessment: 78 yo female s/p L-BKA on 01/16/12 to resume Coumadin which she takes for Atrial Fibrillation and to continue Vancomycin for empiric coverage.  Her home dose of Coumadin is 5 mg daily, except 2.5 mg on Tue & Sat.  Of note, pt received Vit K 5 mg PO x1 on 10/9. INR today is subtherapeutic (1.28) after one 5 mg dose last night. Pt is also on enoxaparin 40 mg SQ q24h. No bleeding noted, plts ok.  Pt has been receiving Vancomycin 1000mg  IV q12 due to several venous stasis ulcers to continue post-op.  Will plan to check steady state trough if plan to continue for several days.  Goal of  Therapy:  INR 2-3  Vancomycin Trough 10-15 mcg/ml Monitor platelets by anticoagulation protocol: Yes   Plan:  1.  Repeat Coumadin 5 mg PO x1 tonight 2.  Daily PT/INR 3. Continue Vancomycin 1000mg  IV q12 4. Vancomycin trough at 0930 tomorrow   Nicolasa Ducking, PharmD Clinical Pharmacist Pgr [phone] 01/17/2012 1:03 PM

## 2012-01-17 NOTE — Op Note (Signed)
Kathryn Schroeder, WANE NO.:  [id number]  MEDICAL RECORD NO.:  [id number]  LOCATION:  [id number]                        FACILITY:  [facility]  PHYSICIAN:  Claude Manges. Deundra Furber, M.D.DATE OF BIRTH:  06/14/1930  DATE OF PROCEDURE:  01/16/2012 DATE OF DISCHARGE:  01/16/2012                              OPERATIVE REPORT   PREOPERATIVE DIAGNOSIS:  Diabetic peripheral vascular disease with nonhealing venous stasis ulcers left lower extremity.  POSTOPERATIVE DIAGNOSIS:  Diabetic peripheral vascular disease with nonhealing venous stasis ulcers left lower extremity.  PROCEDURE:  Left below-knee amputation.  SURGEON:  Claude Manges. Cleophas Dunker, M.D.  ASSISTANT:  Arlys John D. Petrarca, P.A.-C. who was present the operative procedure to ensure its timely completion.  ANESTHESIA:  General.  COMPLICATIONS:  None.  PROCEDURE:  Ms. Seppala was met in the holding area, identified the left lower extremity as appropriate operative site.  We had a long discussion with the family and with Ms. Revels.  She was perfectly comfortable with the procedure and to proceed, so we then transported the patient to room #7 and placed under general anesthesia without difficulty.  A tourniquet was applied to the left thigh.  The left leg was then prepped with Betadine scrub and then Betadine prep from the knee to the ankle, sterile draping was performed.  There were multiple superficial cultures measuring over 5 cm in diameter.  The more proximal ulcer was along the calf and it was approximately 20 cm distal to the knee joint.  There was a much larger deeper ulcer that extended along the Achilles tendon over an area of probably 10 to 12 cm, there was draining last week, but it was not draining today.  I did not feel pus is in her foot.  We elected not to use a tourniquet, outline skin incision placed about 11 cm distal to the knee joint, and then a posterior flap that was placed just proximal to one of  the superficial cultures.  Via sharp dissection the transverse incision was taken down to the subcutaneous tissue and then using the Bovie I incised small bleeders.  The medial and lateral muscle compartments were then dissected with Bovie to the level of neurovascular bundles.  Veins, arteries, and nerves were then identified, clamped proximally and distally, and then incised with the Bovie.  Major neurovascular bundles were doubly suture ligated with 2-0 silk.  We identified the branches of the peroneal nerve and the tibial nerve.  We retracted them and bovied them so that they would retract into the deep muscle.  Using an oscillating saw, the tibia was osteotomized about 1 cm proximal to the skin incision.  Also osteotomized the fibula about 1 cm proximal to the level of the tibial amputation.  Once all the posterior neurovascular bundles were identified and controlled then used the amputation knife to bevel the posterior musculature to the level of the skin incision posteriorly and distally. Completed the amputation using the 15 blade knife and incising the skin.  The leg was then removed from the operating table.  Small bleeders were then Bovie coagulated throughout the musculature.  I then tapered the tibia anteriorly with the oscillating saw.  I copiously irrigated the wound.  We had excellent  muscle turgor and we had nice vascularity throughout the skin edges as well as the musculature.  There was no evidence of any abscess formation or pathologic muscle.  Fascial level of closure was then performed using initially #1 Vicryl and then 2-0 Vicryl.  The skin was closed with skin clips.  There was nice closure without tension.  Sterile bulky dressing was applied followed by plaster splints and Ace bandage.  The patient tolerated the procedure without complications.  There was a small ulcer located on the posterolateral aspect of the right leg distally.  This appeared to be  very superficial and clean with nice granulation tissue, so this was not debrided, but a new Mepilex dressing was then applied.  The patient was then awoken up, placed in the operating stretcher, and returned to the postanesthesia recovery room in satisfactory condition.     Claude Manges. Cleophas Dunker, M.D.     PWW/MEDQ  D:  01/16/2012  T:  01/17/2012  Job:  [id number]

## 2012-01-17 NOTE — Plan of Care (Signed)
Problem: Phase II Progression Outcomes Goal: Transfers with moderate assist Outcome: Not Met (add Reason) Needs at least three person assist Goal: Ambulate with moderate assist Outcome: Not Met (add Reason) Can not ambulate without at least 3 person assist

## 2012-01-17 NOTE — Evaluation (Signed)
Physical Therapy Evaluation Patient Details Name: Kathryn Schroeder MRN: [id number] DOB: [birth year] Today's Date: 01/17/2012 Time: 8413-2440 PT Time Calculation (min): 27 min  PT Assessment / Plan / Recommendation Clinical Impression  Pt is a pleasent and motivated 78 y.o. female s/p left BKA.  Patient demonstrates deficits in functional mobility secondary to pain, weakness, and resrtictions.  Pt is a good candidate for continued inpatient rehab and will benefit from continued skilled PT to address deficits and improve overall function.  Pt was instructed in self care techniques for residual limb including sensation and stimulation via rubbing as well as the importance of proper wrapping for edema control.  Will continue to see acutely and progress activity as tolerated.    PT Assessment  Patient needs continued PT services    Follow Up Recommendations  Post acute inpatient    Does the patient have the potential to tolerate intense rehabilitation   Yes, Recommend IP Rehab Screening  Barriers to Discharge Inaccessible home environment;Decreased caregiver support Steps to enter the home; no continual assist available    Equipment Recommendations  Rolling walker with 5" wheels;3 in 1 bedside comode;Wheelchair (measurements);Wheelchair cushion (measurements)    Recommendations for Other Services Rehab consult   Frequency 7X/week    Precautions / Restrictions Precautions Precautions: Other (comment) (positioning to prevent contractures) Restrictions Weight Bearing Restrictions: Yes LLE Weight Bearing: Non weight bearing Other Position/Activity Restrictions: no pillow under thigh/knee   Pertinent Vitals/Pain 3/10 prior to activity      Mobility  Bed Mobility Bed Mobility: Rolling Left;Left Sidelying to Sit;Sitting - Scoot to Edge of Bed Rolling Left: 4: Min assist;With rail Left Sidelying to Sit: HOB flat;3: Mod assist Supine to Sit: 3: Mod assist;HOB flat;With  rails Sitting - Scoot to Edge of Bed: 4: Min assist Details for Bed Mobility Assistance: VC's provided for technique, hand placement, and body positioning; assist provided for trunk support; VC's for maintiaining contact with floor with RLE upon sitting Transfers Transfers: Sit to Stand;Stand to Sit;Stand Pivot Transfers Sit to Stand: 1: +2 Total assist;3: Mod assist;From bed;From elevated surface Sit to Stand: Patient Percentage: 50% Stand to Sit: 1: +2 Total assist;3: Mod assist;With upper extremity assist;To bed;To chair/3-in-1 Stand to Sit: Patient Percentage: 50% Stand Pivot Transfers: 1: +2 Total assist;2: Max assist;From elevated surface Stand Pivot Transfers: Patient Percentage: 30% Details for Transfer Assistance: VC's for upright posture; and wbing through RLE; manual assist to mainatin upright and to perform pivot; manual cues for hand placement  Ambulation/Gait Ambulation/Gait Assistance: Not tested (comment)       Exercises Amputee Exercises Quad Sets: Strengthening;Left;5 reps (2-3 second holds; VCs and manual cues required) Gluteal Sets: Strengthening (VC's for technique) Hip Flexion/Marching: AROM;Left;5 reps Knee Extension: AROM;Left;5 reps;Seated Straight Leg Raises: Strengthening;Left;Other reps (comment) (3-5 reps performed intermittantly throughout session) Chair Push Up: Strengthening;Seated (VC's for technique )   PT Diagnosis: Generalized weakness;Acute pain;Difficulty walking  PT Problem List: Decreased strength;Decreased activity tolerance;Decreased balance;Decreased mobility;Decreased knowledge of use of DME;Decreased knowledge of precautions;Impaired sensation;Pain PT Treatment Interventions: DME instruction;Gait training;Stair training;Functional mobility training;Therapeutic activities;Therapeutic exercise;Balance training;Wheelchair mobility training;Manual techniques;Patient/family education   PT Goals Acute Rehab PT Goals PT Goal Formulation: With  patient Time For Goal Achievement: 01/22/12 Potential to Achieve Goals: Fair Pt will go Supine/Side to Sit: with modified independence PT Goal: Supine/Side to Sit - Progress: Goal set today Pt will Transfer Bed to Chair/Chair to Bed: with modified independence PT Transfer Goal: Bed to Chair/Chair to Bed - Progress: Goal set today Pt will  Stand: with min assist PT Goal: Stand - Progress: Goal set today Pt will Ambulate: 1 - 15 feet;with modified independence;with rolling walker PT Goal: Ambulate - Progress: Goal set today Pt will Go Up / Down Stairs: Flight;with min assist PT Goal: Up/Down Stairs - Progress: Goal set today  Visit Information  Last PT Received On: 01/17/12 Assistance Needed: +2    Subjective Data  Subjective: The pain medicine makes me a little out of it Patient Stated Goal: To work hard and get better   Prior Functioning  Home Living Lives With: Alone Type of Home: House Home Access: Stairs to enter Secretary/administrator of Steps: 12 Entrance Stairs-Rails: Can reach both Home Layout: One level (water dryer/ freezer with extra food) Bathroom Shower/Tub: Walk-in shower;Curtain Bathroom Toilet: Handicapped height Bathroom Accessibility: Yes How Accessible: Accessible via walker Home Adaptive Equipment: Grab bars in shower;Bedside commode/3-in-1;Walker - four wheeled;Reacher Prior Function Level of Independence: Independent Able to Take Stairs?: Yes Driving: Yes (several months ago) Vocation: Retired Musician: No difficulties Dominant Hand: Right    Cognition  Overall Cognitive Status: Appears within functional limits for tasks assessed/performed Arousal/Alertness: Awake/alert Orientation Level: Appears intact for tasks assessed Behavior During Session: [facility] for tasks performed    Extremity/Trunk Assessment Right Upper Extremity Assessment RUE ROM/Strength/Tone: WFL for tasks assessed RUE Sensation: WFL - Light Touch RUE  Coordination: WFL - gross/fine motor Left Upper Extremity Assessment LUE ROM/Strength/Tone: WFL for tasks assessed LUE Sensation: WFL - Light Touch LUE Coordination: WFL - gross/fine motor Right Lower Extremity Assessment RLE ROM/Strength/Tone: WFL for tasks assessed RLE Sensation: History of peripheral neuropathy;Deficits Left Lower Extremity Assessment LLE ROM/Strength/Tone: Deficits;Unable to fully assess;Due to precautions;Due to pain LLE Sensation: Deficits Trunk Assessment Trunk Assessment: Normal   Balance Balance Balance Assessed: Yes Static Sitting Balance Static Sitting - Balance Support: Feet supported (VC's to maintain RLE support on floor) Static Sitting - Comment/# of Minutes: 3 mins Static Standing Balance Static Standing - Balance Support: Bilateral upper extremity supported;During functional activity Static Standing - Level of Assistance: 1: +2 Total assist;Patient percentage (comment)  End of Session PT - End of Session Equipment Utilized During Treatment: Gait belt Activity Tolerance: Patient tolerated treatment well;Patient limited by fatigue;Patient limited by pain Patient left: in chair;with call bell/phone within reach Nurse Communication: Mobility status  GP     Fabio Asa 01/17/2012, 10:54 AM Charlotte Crumb, PT DPT  [phone]

## 2012-01-17 NOTE — Progress Notes (Addendum)
Rehab Admissions Coordinator Note:  Patient was screened by Brock Ra for appropriateness for an Inpatient Acute Rehab Consult.  At this time, pt has an order for Inpatient Rehab consult.  Pt very motivated to resume independence & is participating txs.  Family want CIR.  They can only provide intermittent assist once pt home, so they would want to consider [facility] if pt not able to reach household mod IBrock Ra 01/17/2012, 11:29 AM  I can be reached at [phone].

## 2012-01-17 NOTE — Progress Notes (Signed)
CARE MANAGEMENT NOTE 01/17/2012  Patient:  Kathryn Schroeder, Kathryn Schroeder   Account Number:  [id number]  Date Initiated:  01/17/2012  Documentation initiated by:  Vance Peper  Subjective/Objective Assessment:   78 yr old female s/p left BKA     Action/Plan:   Progession of care and discharge planning. Patient is for possible CIR. waiting for MD to see. 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   Anticipated DC Date:     Anticipated DC Plan:           Choice offered to / List presented to:             Status of service:  In process, will continue to follow Medicare Important Message given?   (If response is "NO", the following Medicare IM given date fields will be blank) Date Medicare IM given:   Date Additional Medicare IM given:    Discharge Disposition:    Per UR Regulation:    If discussed at Long Length of Stay Meetings, dates discussed:    Comments:

## 2012-01-17 NOTE — Consult Note (Signed)
WOC follow up Wound type: venous stasis ulcer right lateral leg, did not require debridement in the OR yesterday.  Verified with PA that ok to reapply 4layer compression wrap to right LE.  Wound bed: pink, moist with thin fibrinous layer on top Drainage (amount, consistency, odor) moderate serous drainage on dressing Periwound: intact Dressing procedure/placement/frequency: Added hydrofiber under foam dressing for maximum absorption, then applied 4 layer compression wrap to the right LE.  Pt verified wraps felt comfortable and not too tight.   Will follow weekly for dressing and compression wrap change Q Friday  Kathryn Schroeder [place], [place] [phone]

## 2012-01-17 NOTE — Progress Notes (Signed)
[facility] PROGRESS NOTE  Kathryn Schroeder NWG:[id number] DOB: March 11, 1931 DOA: 01/14/2012 PCP: Romeo Rabon, MD  Assessment/Plan: Active Problems:  Coronary artery disease  Chronic anticoagulation  Atrial fibrillation with controlled ventricular response  Diabetes mellitus type 2, controlled  Leg pain  Venous stasis ulcers     *#1 ruptured his Achilles tendon  .Venous stasis ulcers, status post below knee amputation on the left patient admitted to telemetry  Physical Therapy as ordered Non Weight Bearing (NWB) left leg Inpatient Rehab vs SNF HHPT, HHRN, Walker and 3-in-1 comode seat  Await InPt rehab verification. Will change dressing next 2-3 days Given stasis ulcer on the right leg, will continue with IV vancomycin, follow wound culture, change to oral antibiotic upon discharge     #2 Atrial fibrillation with controlled ventricular response  Continue home medications for rate control per particularly  INR 1.32, Lovenox and Coumadin have been resumed  Status post vitamin K prior to surgery    . #3 Diabetes mellitus type 2, controlled,  HBA1C is 7.9. CBGs remained somewhat elevated, so she is being managed with SSI/Lantus, adjusted as indicated. Metformin on hold   #4 Coronary artery disease/stable/asymptomatic  Continue aspirin postoperatively, Coreg   Had a recent 2-D echo will not repeat      #5 perioperative mortality discussed this with the patient. She remains at high risk for perioperative morbidity and mortality given multiple cardiovascular risk factors, she remains at risk of atrial fibrillation with rapid ventricular response postoperatively, however hemodynamically stable postoperatively for now   #6 wound care  Wound type: venous stasis ulcer right lateral leg, did not require debridement in the OR yesterday. Verified with PA that ok to reapply 4layer compression wrap to right LE.  Wound bed: pink, moist with thin fibrinous layer on top    Drainage (amount, consistency, odor) moderate serous drainage on dressing  Periwound: intact  Dressing procedure/placement/frequency: Added hydrofiber under foam dressing for maximum absorption, then applied 4 layer compression wrap to the right LE Will follow weekly for dressing and compression wrap change Q Friday     #7 disposition Possible discharge to SNF Monday, inpatient rehabilitation versus SNF   HPI/Subjective:  negative for Chest Pain  negative for Shortness of Breath  negative for Nausea/Vomiting  negative for Calf Pain   Objective: Filed Vitals:   01/16/12 1653 01/16/12 1827 01/16/12 2025 01/17/12 0646  BP:  154/52 154/66 153/68  Pulse: 64 65 71 66  Temp: 97.2 F (36.2 C) 97.5 F (36.4 C) 97.7 F (36.5 C) 98 F (36.7 C)  TempSrc:  Axillary    Resp: 19 18 18 16   SpO2: 100% 96% 97% 100%    Intake/Output Summary (Last 24 hours) at 01/17/12 0932 Last data filed at 01/16/12 1600  Gross per 24 hour  Intake    650 ml  Output      1 ml  Net    649 ml    Exam:  General: Alert and oriented times three, well developed and nourished, no acute distress  Eyes: PERRLA, pink conjunctiva, no scleral icterus  ENT: Moist oral mucosa, neck supple, no thyromegaly  Lungs: clear to ascultation, no wheeze, no crackles, no use of accessory muscles  Cardiovascular: regular rate and rhythm, no regurgitation, no gallops, no murmurs. No carotid bruits, no JVD  Abdomen: soft, positive BS, non-tender, non-distended, no organomegaly, not an acute abdomen  GU: not examined  Neuro: CN II - XII grossly intact, sensation intact  Musculoskeletal: strength 5/5 all extremities, no  clubbing, cyanosis or edema, status post left BKA, right leg ulcer on calf. Venous stasis ulcer worse on the left calf  Skin: no rash, no subcutaneous crepitation, no decubitus  Psych: appropriate patient    Data Reviewed: Basic Metabolic Panel:  Lab 01/17/12 4098 01/15/12 1337 01/15/12 1113 01/15/12  0535 01/14/12 2007  NA 137 136 137 140 --  K 4.3 4.1 4.0 4.0 --  CL 103 101 102 104 --  CO2 26 27 26 26  --  GLUCOSE 145* 260* 196* 136* --  BUN 13 19 18 16  --  CREATININE 0.77 0.86 0.80 0.80 0.79  CALCIUM 8.7 9.1 9.0 9.5 --  MG -- -- -- -- --  PHOS -- -- -- -- --    Liver Function Tests:  Lab 01/17/12 0530 01/15/12 1337 01/15/12 0535  AST 13 11 11   ALT 7 6 6   ALKPHOS 41 47 53  BILITOT 0.4 0.2* 0.3  PROT 5.4* 5.7* 6.3  ALBUMIN 2.4* 2.6* 2.9*   No results found for this basename: LIPASE:5,AMYLASE:5 in the last 168 hours No results found for this basename: AMMONIA:5 in the last 168 hours  CBC:  Lab 01/17/12 0530 01/16/12 0450 01/15/12 1337 01/15/12 0535 01/14/12 2007  WBC 7.7 6.7 5.7 6.9 6.3  NEUTROABS -- -- -- -- --  HGB 8.1* 9.0* 8.9* 9.6* 9.0*  HCT 24.9* 27.3* 27.4* 29.4* 27.5*  MCV 93.3 93.2 94.8 93.0 92.9  PLT 234 247 252 297 279    Cardiac Enzymes:  Lab 01/15/12 1113  CKTOTAL --  CKMB --  CKMBINDEX --  TROPONINI <0.30   BNP (last 3 results)  Basename 01/14/12 1938 10/22/11 0341  PROBNP 3478.0* 5797.0*     CBG:  Lab 01/17/12 0644 01/16/12 2157 01/16/12 1552 01/16/12 1230 01/16/12 0640  GLUCAP 135* 164* 169* 193* 153*    Recent Results (from the past 240 hour(s))  SURGICAL PCR SCREEN     Status: Normal   Collection Time   01/14/12 10:37 PM      Component Value Range Status Comment   MRSA, PCR NEGATIVE  NEGATIVE Final    Staphylococcus aureus NEGATIVE  NEGATIVE Final   URINE CULTURE     Status: Normal (Preliminary result)   Collection Time   01/15/12  3:58 PM      Component Value Range Status Comment   Specimen Description URINE, CLEAN CATCH   Final    Special Requests NONE   Final    Culture  Setup Time 01/15/2012 16:56   Final    Colony Count PENDING   Incomplete    Culture Culture reincubated for better growth   Final    Report Status PENDING   Incomplete      Studies: Dg Chest Port 1 View  01/15/2012  *RADIOLOGY REPORT*  Clinical Data:  Preoperative respiratory exam.  Hypertension and diabetes.  PORTABLE CHEST - 1 VIEW  Comparison: 10/21/2011  Findings: Artifact overlies chest.  Heart size is normal.  Lungs are clear.  There is chronic elevation of the right hemidiaphragm. There is chronic right paratracheal prominence related to intrathoracic extension of a thyroid goiter.  IMPRESSION: No active disease.  Chronic elevated right hemidiaphragm.  Chronic intrathoracic extension of thyroid goiter.   Original Report Authenticated By: Thomasenia Sales, M.D.     Scheduled Meds:   . acetaminophen  1,000 mg Intravenous Once  . acetaminophen  1,000 mg Intravenous Q6H  . brimonidine  1 drop Both Eyes BID  . carvedilol  12.5 mg Oral  BID WC  . coumadin book   Does not apply Once  . docusate sodium  100 mg Oral BID  . enoxaparin (LOVENOX) injection  40 mg Subcutaneous Q24H  . HYDROmorphone      . insulin aspart  0-15 Units Subcutaneous TID WC  . losartan  25 mg Oral Daily  . magnesium oxide  400 mg Oral BID  . sodium chloride  3 mL Intravenous Q12H  . timolol  1 drop Both Eyes BID  . vancomycin  1,000 mg Intravenous Q12H  . warfarin  5 mg Oral ONCE-1800  . warfarin   Does not apply Once  . Warfarin - Pharmacist Dosing Inpatient   Does not apply q1800  . DISCONTD: chlorhexidine  60 mL Topical Q2000  . DISCONTD: chlorhexidine  60 mL Topical Once   Continuous Infusions:   . DISCONTD: sodium chloride 75 mL/hr at 01/16/12 0726  . DISCONTD: lactated ringers 50 mL/hr at 01/16/12 1302    Active Problems:  Coronary artery disease  Chronic anticoagulation  Atrial fibrillation with controlled ventricular response  Diabetes mellitus type 2, controlled  Leg pain  Venous stasis ulcers    Time spent: 40 minutes   [facility]  [facility] Pager [phone]. If 8PM-8AM, please contact night-coverage at [email or web address], password [facility] 01/17/2012, 9:32 AM  LOS: 3 days

## 2012-01-18 LAB — COMPREHENSIVE METABOLIC PANEL
ALT: 7 U/L (ref 0–35)
AST: 16 U/L (ref 0–37)
Albumin: 2.4 g/dL — ABNORMAL LOW (ref 3.5–5.2)
Alkaline Phosphatase: 61 U/L (ref 39–117)
CO2: 23 mEq/L (ref 19–32)
Chloride: 102 mEq/L (ref 96–112)
Creatinine, Ser: 0.87 mg/dL (ref 0.50–1.10)
GFR calc non Af Amer: 62 mL/min — ABNORMAL LOW (ref >90)
Potassium: 4.5 mEq/L (ref 3.5–5.1)
Total Bilirubin: 0.4 mg/dL (ref 0.3–1.2)

## 2012-01-18 LAB — CBC
MCH: 30.5 pg (ref 26.0–34.0)
MCHC: 32.4 g/dL (ref 30.0–36.0)
MCV: 94 fL (ref 78.0–100.0)
Platelets: 230 10*3/uL (ref 150–400)
RBC: 2.66 MIL/uL — ABNORMAL LOW (ref 3.87–5.11)
RDW: 15 % (ref 11.5–15.5)

## 2012-01-18 LAB — GLUCOSE, CAPILLARY
Glucose-Capillary: 209 mg/dL — ABNORMAL HIGH (ref 70–99)
Glucose-Capillary: 183 mg/dL — ABNORMAL HIGH (ref 70–99)

## 2012-01-18 LAB — PROTIME-INR
INR: 1.33 (ref 0.00–1.49)
Prothrombin Time: 16.2 seconds — ABNORMAL HIGH (ref 11.6–15.2)

## 2012-01-18 LAB — VANCOMYCIN, TROUGH: Vancomycin Tr: 28.4 ug/mL (ref 10.0–20.0)

## 2012-01-18 MED ORDER — WARFARIN SODIUM 6 MG PO TABS
6.0000 mg | ORAL_TABLET | Freq: Once | ORAL | Status: AC
  Administered 2012-01-18: 6 mg via ORAL
  Filled 2012-01-18: qty 1

## 2012-01-18 MED ORDER — ACETAMINOPHEN 325 MG PO TABS
650.0000 mg | ORAL_TABLET | Freq: Four times a day (QID) | ORAL | Status: DC | PRN
  Administered 2012-01-18 – 2012-01-19 (×2): 650 mg via ORAL
  Filled 2012-01-18 (×2): qty 2

## 2012-01-18 MED ORDER — BISACODYL 5 MG PO TBEC
5.0000 mg | DELAYED_RELEASE_TABLET | Freq: Every day | ORAL | Status: DC
  Administered 2012-01-18 – 2012-01-19 (×2): 5 mg via ORAL
  Filled 2012-01-18 (×2): qty 1

## 2012-01-18 MED ORDER — VANCOMYCIN HCL 1000 MG IV SOLR
1250.0000 mg | INTRAVENOUS | Status: DC
  Administered 2012-01-18: 1250 mg via INTRAVENOUS
  Filled 2012-01-18 (×2): qty 1250

## 2012-01-18 MED ORDER — DOCUSATE SODIUM 100 MG PO CAPS: 100.0000 mg | ORAL_CAPSULE | Freq: Two times a day (BID) | ORAL | Status: DC

## 2012-01-18 NOTE — Progress Notes (Signed)
ANTICOAGULATION and ANTIBIOTIC CONSULT NOTE - Follow up Consult  Pharmacy Consult for Coumadin and Vancomycin Indication:  VTE prophylaxis/AFib; Empiric coverage post-op  Allergies  Allergen Reactions  . Bactrim (Sulfamethoxazole W-Trimethoprim) Hives  . Janumet (Sitagliptin-Metformin Hcl) Nausea Only, Swelling and Other (See Comments)    weakness  . Adhesive (Tape)     WHITE tape: Causes blisters and skin reaction  . Ciprofloxacin Other (See Comments)    unknown  . Percocet (Oxycodone-Acetaminophen) Other (See Comments)    Makes her crazy  . Sulfa Antibiotics Hives    Hyperkalemia with bactrim  . Penicillins Itching and Rash    Childhood allergy:     Patient Measurements:   Vital Signs: Temp: 98.8 F (37.1 C) (10/12 0425) BP: 126/67 mmHg (10/12 0425) Pulse Rate: 74  (10/12 0425)  Labs:  Basename 01/18/12 0640 01/17/12 0530 01/16/12 0450 01/15/12 1337 01/15/12 1113  HGB 8.1* 8.1* -- -- --  HCT 25.0* 24.9* 27.3* -- --  PLT 230 234 247 -- --  APTT -- -- -- 39* --  LABPROT 16.2* 15.7* 16.1* -- --  INR 1.33 1.28 1.32 -- --  HEPARINUNFRC -- -- -- -- --  CREATININE 0.87 0.77 -- 0.86 --  CKTOTAL -- -- -- -- --  CKMB -- -- -- -- --  TROPONINI -- -- -- -- <0.30    The CrCl is unknown because both a height and weight (above a minimum accepted value) are required for this calculation.   Assessment: 78 yo female s/p L-BKA on 01/16/12 to resume Coumadin which she takes for Atrial Fibrillation and to continue Vancomycin for empiric coverage.  Her home dose of Coumadin is 5 mg daily, except 2.5 mg on Tue & Sat.  Of note, pt received Vit K 5 mg PO x1 on 10/9. INR today is subtherapeutic (1.33) after two doses of 5 mg. Pt is also on enoxaparin 40 mg SQ q24h until INR >1.8. No bleeding noted, CBC stable from yesterday.   Pt has been receiving Vancomycin 1000mg  IV q12 due to several venous stasis ulcers to continue post-op.  Vanc trough today is high at 28.4, drawn appropriately.  Will delay next dose to give time to clear. SCr has been fluctuating between 0.77 and 0.87.  Pt is afebrile, WBC wnl.  Goal of Therapy:  INR 2-3  Vancomycin Trough 10-15 mcg/ml Monitor platelets by anticoagulation protocol: Yes   Plan:  1.  Coumadin 6 mg PO x1 tonight 2.  Daily PT/INR 3. Change Vancomycin to 1250 mg IV q24 starting at 2000 4. Follow up renal function, vancomycin trough at new steady state 5. Provided Coumadin counseling with handout   Nicolasa Ducking, PharmD Clinical Pharmacist Pgr [phone] 01/18/2012 9:48 AM

## 2012-01-18 NOTE — Progress Notes (Signed)
Patient ID: Kathryn Schroeder, female   DOB: 23-Jun-1930, 78 y.o.   MRN: [id number] PATIENT ID: Kathryn Schroeder  MRN: [id number]  DOB/AGE:  1930/12/17 / 78 y.o.  2 Days Post-Op Procedure(s) (LRB): AMPUTATION BELOW KNEE (Left)    PROGRESS NOTE Subjective: Patient is alert, oriented, no Nausea, no Vomiting, yes passing gas, no Bowel Movement. Taking PO yes. Denies SOB, Chest or Calf Pain. Using Incentive Spirometer, PAS in place. Ambulate bed to chair,  Patient reports pain as 3 on 0-10 scale  .    Objective: Vital signs in last 24 hours: Filed Vitals:   01/17/12 0646 01/17/12 1617 01/17/12 1955 01/18/12 0425  BP: 153/68 126/54 117/56 126/67  Pulse: 66 71 69 74  Temp: 98 F (36.7 C) 98.5 F (36.9 C) 98.5 F (36.9 C) 98.8 F (37.1 C)  TempSrc:      Resp: 16 18 16 18   SpO2: 100% 93% 98% 98%      Intake/Output from previous day: I/O last 3 completed shifts: In: 900 [P.O.:480; I.V.:220; IV Piggyback:200] Out: 1 [Urine:1]   Intake/Output this shift:     LABORATORY DATA:  Basename 01/18/12 0703 01/18/12 0640 01/17/12 2232 01/17/12 1640 01/17/12 0530  WBC -- 10.0 -- -- 7.7  HGB -- 8.1* -- -- 8.1*  HCT -- 25.0* -- -- 24.9*  PLT -- 230 -- -- 234  NA -- 135 -- -- 137  K -- 4.5 -- -- 4.3  CL -- 102 -- -- 103  CO2 -- 23 -- -- 26  BUN -- 18 -- -- 13  CREATININE -- 0.87 -- -- 0.77  GLUCOSE -- 166* -- -- 145*  GLUCAP 153* -- 178* 182* --  INR -- 1.33 -- -- 1.28  CALCIUM -- 8.9 -- -- --    Examination: ABD soft Incision: dressing C/D/I}  Assessment:   2 Days Post-Op Procedure(s) (LRB): AMPUTATION BELOW KNEE (Left) ADDITIONAL DIAGNOSIS:   Patient Active Problem List  Diagnosis  . Coronary artery disease  . Diabetes mellitus  . Chronic anticoagulation  . Atrial fibrillation with controlled ventricular response  . Type II or unspecified type diabetes mellitus without mention of complication, not stated as uncontrolled  . HTN (hypertension)  . ARF (acute renal  failure)  . Hyperkalemia  . Diabetes mellitus type 2, controlled  . Cellulitis  . Leg pain  . Venous stasis ulcers  . Aorto-iliac disease (MILD)  . Acute respiratory failure with hypoxia  . Interstitial edema  . Metabolic encephalopathy     Plan: PT/OT NWB on left lower extremitiy DVT Prophylaxis:  [id number], coumadin DISCHARGE PLAN: Inpatient Rehab  DISCHARGE NEEDS: family's plan is for inpatient rehab and then SNF.  Daughter is in town from [place] J 01/18/2012, 9:44 AM

## 2012-01-18 NOTE — Progress Notes (Signed)
Subjective: 2 Days Post-Op Procedure(s) (LRB): AMPUTATION BELOW KNEE (Left) Patient reports pain as mild.   Objective: Vital signs in last 24 hours: Temp:  [98.4 F (36.9 C)-98.8 F (37.1 C)] 98.4 F (36.9 C) (10/12 1441) Pulse Rate:  [69-74] 70  (10/12 1441) Resp:  [16-18] 18  (10/12 1441) BP: (117-132)/(54-67) 132/54 mmHg (10/12 1441) SpO2:  [93 %-98 %] 96 % (10/12 1441)  Intake/Output from previous day: 10/11 0701 - 10/12 0700 In: 900 [P.O.:480; I.V.:220; IV Piggyback:200] Out: 1 [Urine:1] Intake/Output this shift: Total I/O In: 560 [P.O.:560] Out: 1 [Urine:1]   Basename 01/18/12 0640 01/17/12 0530 01/16/12 0450  HGB 8.1* 8.1* 9.0*    Basename 01/18/12 0640 01/17/12 0530  WBC 10.0 7.7  RBC 2.66* 2.67*  HCT 25.0* 24.9*  PLT 230 234    Basename 01/18/12 0640 01/17/12 0530  NA 135 137  K 4.5 4.3  CL 102 103  CO2 23 26  BUN 18 13  CREATININE 0.87 0.77  GLUCOSE 166* 145*  CALCIUM 8.9 8.7    Basename 01/18/12 0640 01/17/12 0530  LABPT -- --  INR 1.33 1.28    dressing on BKA is dry  and intact.   Assessment/Plan: 2 Days Post-Op Procedure(s) (LRB): AMPUTATION BELOW KNEE (Left) PLAN       REHAB WHEN BED AVAILABLE. ? Monday or Tuesday.   Josh Nicolosi C 01/18/2012, 3:00 PM

## 2012-01-18 NOTE — Progress Notes (Signed)
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 Will change dressing next 2-3 days  Given stasis ulcer on the right leg, will continue with IV vancomycin, follow wound culture, possibly change to Augmentin by mouth upon discharge    #2 Atrial fibrillation with controlled ventricular response  Continue home medications for rate control per particularly  INR 1.32, Lovenox and Coumadin have been resumed  Status post vitamin K prior to surgery   . #3 Diabetes mellitus type 2, controlled,  HBA1C is 7.9. CBGs remained somewhat elevated, so she is being managed with SSI/Lantus, adjusted as indicated. Metformin on hold   #4 Coronary artery disease/stable/asymptomatic  Continue aspirin postoperatively, Coreg  Had a recent 2-D echo will not repeat    #5 perioperative mortality discussed this with the patient. She remains at high risk for perioperative morbidity and mortality given multiple cardiovascular risk factors, she remains at risk of atrial fibrillation with rapid ventricular response postoperatively, however hemodynamically stable postoperatively for now    #6 wound care  Wound type: venous stasis ulcer right lateral leg, did not require debridement in the OR yesterday. Verified with PA that ok to reapply 4layer compression wrap to right LE.  Wound bed: pink, moist with thin fibrinous layer on top   Drainage (amount, consistency, odor) moderate serous drainage on dressing  Periwound: intact  Dressing procedure/placement/frequency: Added hydrofiber under foam dressing for maximum absorption, then applied 4 layer compression wrap to the right LE  Will follow weekly for dressing and compression wrap change Q Friday    #7 disposition  Possible discharge to SNF Monday, inpatient rehabilitation versus SNF  HPI/Subjective:  negative for Chest Pain  negative for Shortness of Breath  negative for Nausea/Vomiting  negative for Calf Pain     Objective: Filed Vitals:   01/17/12 0646 01/17/12 1617 01/17/12 1955 01/18/12 0425  BP: 153/68 126/54 117/56 126/67  Pulse: 66 71 69 74  Temp: 98 F (36.7 C) 98.5 F (36.9 C) 98.5 F (36.9 C) 98.8 F (37.1 C)  TempSrc:      Resp: 16 18 16 18   SpO2: 100% 93% 98% 98%    Intake/Output Summary (Last 24 hours) at 01/18/12 0915 Last data filed at 01/18/12 0602  Gross per 24 hour  Intake    660 ml  Output      1 ml  Net    659 ml    Exam:  HENT:  Head: Atraumatic.  Nose: Nose normal.  Mouth/Throat: Oropharynx is clear and moist.  Eyes: Conjunctivae are normal. Pupils are equal, round, and reactive to light. No scleral icterus.  Neck: Neck supple. No tracheal deviation present.  Cardiovascular: Normal rate, regular rhythm, normal heart sounds and intact distal pulses.  Pulmonary/Chest: Effort normal and breath sounds normal. No respiratory distress.  Abdominal: Soft. Normal appearance and bowel sounds are normal. She exhibits no distension. There is no tenderness.  Musculoskeletal: She exhibits no edema and no tenderness.  Neurological: She  is alert. No cranial nerve deficit.    Data Reviewed: Basic Metabolic Panel:  Lab 01/18/12 4782 01/17/12 0530 01/15/12 1337 01/15/12 1113 01/15/12 0535  NA 135 137 136 137 140  K 4.5 4.3 4.1 4.0 4.0  CL 102 103 101 102 104  CO2 23 26 27 26 26   GLUCOSE 166* 145* 260* 196* 136*  BUN 18 13 19 18  16   CREATININE 0.87 0.77 0.86 0.80 0.80  CALCIUM 8.9 8.7 9.1 9.0 9.5  MG -- -- -- -- --  PHOS -- -- -- -- --    Liver Function Tests:  Lab 01/18/12 0640 01/17/12 0530 01/15/12 1337 01/15/12 0535  AST 16 13 11 11   ALT 7 7 6 6   ALKPHOS 61 41 47 53  BILITOT 0.4 0.4 0.2* 0.3  PROT 5.6* 5.4* 5.7* 6.3  ALBUMIN 2.4* 2.4* 2.6* 2.9*   No results found for this basename: LIPASE:5,AMYLASE:5 in the last 168 hours No results found for this basename: AMMONIA:5 in the last 168 hours  CBC:  Lab 01/18/12 0640 01/17/12 0530 01/16/12 0450 01/15/12 1337 01/15/12 0535  WBC 10.0 7.7 6.7 5.7 6.9  NEUTROABS -- -- -- -- --  HGB 8.1* 8.1* 9.0* 8.9* 9.6*  HCT 25.0* 24.9* 27.3* 27.4* 29.4*  MCV 94.0 93.3 93.2 94.8 93.0  PLT 230 234 247 252 297    Cardiac Enzymes:  Lab 01/15/12 1113  CKTOTAL --  CKMB --  CKMBINDEX --  TROPONINI <0.30   BNP (last 3 results)  Basename 01/14/12 1938 10/22/11 0341  PROBNP 3478.0* 5797.0*     CBG:  Lab 01/18/12 0703 01/17/12 2232 01/17/12 1640 01/17/12 1114 01/17/12 0644  GLUCAP 153* 178* 182* 224* 135*    Recent Results (from the past 240 hour(s))  SURGICAL PCR SCREEN     Status: Normal   Collection Time   01/14/12 10:37 PM      Component Value Range Status Comment   MRSA, PCR NEGATIVE  NEGATIVE Final    Staphylococcus aureus NEGATIVE  NEGATIVE Final   URINE CULTURE     Status: Normal   Collection Time   01/15/12  3:58 PM      Component Value Range Status Comment   Specimen Description URINE, CLEAN CATCH   Final    Special Requests NONE   Final    Culture  Setup Time 01/15/2012 16:56   Final    Colony Count >=100,000 COLONIES/ML   Final    Culture YEAST   Final    Report Status 01/17/2012 FINAL   Final      Studies: Dg Chest Port 1 View  01/15/2012  *RADIOLOGY REPORT*  Clinical Data: Preoperative respiratory exam.  Hypertension and diabetes.  PORTABLE CHEST - 1 VIEW  Comparison: 10/21/2011  Findings: Artifact overlies chest.  Heart size is  normal.  Lungs are clear.  There is chronic elevation of the right hemidiaphragm. There is chronic right paratracheal prominence related to intrathoracic extension of a thyroid goiter.  IMPRESSION: No active disease.  Chronic elevated right hemidiaphragm.  Chronic intrathoracic extension of thyroid goiter.   Original Report Authenticated By: Thomasenia Sales, M.D.     Scheduled Meds:   . acetaminophen  1,000 mg Intravenous Q6H  . brimonidine  1 drop Both Eyes BID  . carvedilol  12.5 mg Oral BID WC  . docusate sodium  100 mg Oral BID  . enoxaparin (LOVENOX) injection  40 mg Subcutaneous Q24H  . insulin aspart  0-15 Units Subcutaneous TID WC  .  losartan  25 mg Oral Daily  . magnesium oxide  400 mg Oral BID  . sodium chloride  3 mL Intravenous Q12H  . timolol  1 drop Both Eyes BID  . vancomycin  1,000 mg Intravenous Q12H  . warfarin  5 mg Oral ONCE-1800  . warfarin   Does not apply Once  . Warfarin - Pharmacist Dosing Inpatient   Does not apply q1800   Continuous Infusions:   Active Problems:  Coronary artery disease  Chronic anticoagulation  Atrial fibrillation with controlled ventricular response  Diabetes mellitus type 2, controlled  Leg pain  Venous stasis ulcers    Time spent: 40 minutes   [facility]  [facility] Pager [phone]. If 8PM-8AM, please contact night-coverage at [email or web address], password [facility] 01/18/2012, 9:15 AM  LOS: 4 days

## 2012-01-18 NOTE — Progress Notes (Signed)
Physical Therapy Treatment Patient Details Name: SHAMYA MACFADDEN MRN: [id number] DOB: [birth year] Today's Date: 01/18/2012 Time: 6295-2841 PT Time Calculation (min): 22 min  PT Assessment / Plan / Recommendation Comments on Treatment Session  Patient is a very motivated and sweet lady. She is eager to participate and able to review therex. Continue to progress with transfer and await decision on CIR on monday    Follow Up Recommendations  Post acute inpatient     Does the patient have the potential to tolerate intense rehabilitation  Yes, Recommend IP Rehab Screening  Barriers to Discharge        Equipment Recommendations  Rolling walker with 5" wheels;3 in 1 bedside comode;Wheelchair cushion (measurements);Wheelchair (measurements)    Recommendations for Other Services    Frequency 7X/week   Plan Discharge plan remains appropriate;Frequency remains appropriate    Precautions / Restrictions Restrictions LLE Weight Bearing: Non weight bearing   Pertinent Vitals/Pain     Mobility  Bed Mobility Supine to Sit: 4: Min assist;With rails Sitting - Scoot to Edge of Bed: 3: Mod assist Details for Bed Mobility Assistance: A with use of pad for scooting EOB. Cues and assistance with shoulders into upright positioning.  Transfers Sit to Stand: 1: +2 Total assist;From bed;With upper extremity assist Sit to Stand: Patient Percentage: 50% Stand to Sit: 1: +2 Total assist;To bed;To chair/3-in-1;With upper extremity assist Stand to Sit: Patient Percentage: 50% Stand Pivot Transfers: 1: +2 Total assist Stand Pivot Transfers: Patient Percentage: 20% Details for Transfer Assistance: Patient required A to initate stand and to shift balance over BOS. Cues for technique and positioning throughout. A with hip positioning into recliner as patient unable to move R leg in standing    Exercises Amputee Exercises Quad Sets: AROM;Left;5 reps Hip ABduction/ADduction: AROM;Left;5 reps Straight  Leg Raises: AROM;Left;10 reps   PT Diagnosis:    PT Problem List:   PT Treatment Interventions:     PT Goals Acute Rehab PT Goals PT Goal: Supine/Side to Sit - Progress: Progressing toward goal PT Transfer Goal: Bed to Chair/Chair to Bed - Progress: Progressing toward goal PT Goal: Stand - Progress: Progressing toward goal  Visit Information  Last PT Received On: 01/18/12 Assistance Needed: +2    Subjective Data      Cognition  Overall Cognitive Status: Appears within functional limits for tasks assessed/performed Arousal/Alertness: Awake/alert Orientation Level: Appears intact for tasks assessed Behavior During Session: [facility] for tasks performed    Balance  Static Standing Balance Static Standing - Balance Support: Bilateral upper extremity supported Static Standing - Level of Assistance: 1: +2 Total assist  End of Session PT - End of Session Equipment Utilized During Treatment: Gait belt Activity Tolerance: Patient tolerated treatment well Patient left: in chair;with call bell/phone within reach Nurse Communication: Mobility status   GP     Fredrich Birks 01/18/2012, 11:10 AM  01/18/2012 Fredrich Birks PTA [phone] pager [phone] office

## 2012-01-19 LAB — GLUCOSE, CAPILLARY
Glucose-Capillary: 223 mg/dL — ABNORMAL HIGH (ref 70–99)
Glucose-Capillary: 130 mg/dL — ABNORMAL HIGH (ref 70–99)
Glucose-Capillary: 137 mg/dL — ABNORMAL HIGH (ref 70–99)

## 2012-01-19 MED ORDER — SENNOSIDES-DOCUSATE SODIUM 8.6-50 MG PO TABS: 1.0000 | ORAL_TABLET | Freq: Every evening | ORAL | Status: DC | PRN

## 2012-01-19 MED ORDER — CEPHALEXIN 500 MG PO CAPS: 500.0000 mg | ORAL_CAPSULE | Freq: Three times a day (TID) | ORAL | Status: AC

## 2012-01-19 MED ORDER — CEPHALEXIN 500 MG PO CAPS
500.0000 mg | ORAL_CAPSULE | Freq: Three times a day (TID) | ORAL | Status: DC
  Administered 2012-01-19 – 2012-01-20 (×4): 500 mg via ORAL
  Filled 2012-01-19 (×8): qty 1

## 2012-01-19 MED ORDER — WARFARIN SODIUM 7.5 MG PO TABS
7.5000 mg | ORAL_TABLET | Freq: Once | ORAL | Status: AC
  Administered 2012-01-19: 7.5 mg via ORAL
  Filled 2012-01-19: qty 1

## 2012-01-19 NOTE — Progress Notes (Signed)
ANTICOAGULATION and ANTIBIOTIC CONSULT NOTE - Follow up Consult  Pharmacy Consult for Coumadin Indication:  VTE prophylaxis/AFib  Allergies  Allergen Reactions  . Bactrim (Sulfamethoxazole W-Trimethoprim) Hives  . Janumet (Sitagliptin-Metformin Hcl) Nausea Only, Swelling and Other (See Comments)    weakness  . Adhesive (Tape)     WHITE tape: Causes blisters and skin reaction  . Ciprofloxacin Other (See Comments)    unknown  . Percocet (Oxycodone-Acetaminophen) Other (See Comments)    Makes her crazy  . Sulfa Antibiotics Hives    Hyperkalemia with bactrim  . Penicillins Itching and Rash    Childhood allergy:     Patient Measurements:   Vital Signs: Temp: 98.2 F (36.8 C) (10/13 0826) Temp src: Oral (10/13 0826) BP: 118/47 mmHg (10/13 0826) Pulse Rate: 85  (10/13 0826)  Labs:  Basename 01/19/12 0648 01/18/12 0640 01/17/12 0530  HGB -- 8.1* 8.1*  HCT -- 25.0* 24.9*  PLT -- 230 234  APTT -- -- --  LABPROT 16.5* 16.2* 15.7*  INR 1.37 1.33 1.28  HEPARINUNFRC -- -- --  CREATININE -- 0.87 0.77  CKTOTAL -- -- --  CKMB -- -- --  TROPONINI -- -- --    The CrCl is unknown because both a height and weight (above a minimum accepted value) are required for this calculation.   Assessment: 78 yo female s/p L-BKA on 01/16/12 to resume Coumadin which she takes for Atrial Fibrillation.  Her home dose of Coumadin is 5 mg daily, except 2.5 mg on Tue & Sat.  Of note, pt received Vit K 5 mg PO x1 on 10/9. INR today is subtherapeutic (1.37) and is slowly trending up. Pt is also on enoxaparin 40 mg SQ q24h until INR >1.8. No bleeding noted.  Goal of Therapy:  INR 2-3  Monitor platelets by anticoagulation protocol: Yes   Plan:  1.  Coumadin 7.5 mg PO x1 tonight 2.  Daily PT/INR   Nicolasa Ducking, PharmD Clinical Pharmacist Pgr [phone] 01/19/2012 2:05 PM

## 2012-01-19 NOTE — Progress Notes (Signed)
Subjective: 3 Days Post-Op Procedure(s) (LRB): AMPUTATION BELOW KNEE (Left) Patient reports pain as 2 on 0-10 scale.    Objective: Vital signs in last 24 hours: Temp:  [98.2 F (36.8 C)-99.9 F (37.7 C)] 98.2 F (36.8 C) (10/13 0826) Pulse Rate:  [78-85] 85  (10/13 0826) Resp:  [16-94] 94  (10/13 0826) BP: (118-129)/(47-61) 118/47 mmHg (10/13 0826) SpO2:  [97 %] 97 % (10/12 2059)  Intake/Output from previous day: 10/12 0701 - 10/13 0700 In: 800 [P.O.:800] Out: 3 [Urine:3] Intake/Output this shift:     Basename 01/18/12 0640 01/17/12 0530  HGB 8.1* 8.1*    Basename 01/18/12 0640 01/17/12 0530  WBC 10.0 7.7  RBC 2.66* 2.67*  HCT 25.0* 24.9*  PLT 230 234    Basename 01/18/12 0640 01/17/12 0530  NA 135 137  K 4.5 4.3  CL 102 103  CO2 23 26  BUN 18 13  CREATININE 0.87 0.77  GLUCOSE 166* 145*  CALCIUM 8.9 8.7    Basename 01/19/12 0648 01/18/12 0640  LABPT -- --  INR 1.37 1.33    dressing dry.   Assessment/Plan: 3 Days Post-Op Procedure(s) (LRB): AMPUTATION BELOW KNEE (Left) Up with therapy  To recliner.   Emerita Berkemeier C 01/19/2012, 2:42 PM

## 2012-01-19 NOTE — Progress Notes (Signed)
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 Will change dressing next 2-3 days  Given stasis ulcer on the right leg, will continue antibiotics, based on previous wound culture the patient has been changed to cephalexin for another 2 weeks,    #2 Atrial fibrillation with controlled ventricular response  Continue home medications for rate control per particularly  INR 1.32, Lovenox and Coumadin have been resumed  Status post vitamin K prior to surgery   . #3 Diabetes mellitus type 2, controlled,  HBA1C is 7.9. CBGs remained somewhat elevated, so she is being managed with SSI/Lantus, adjusted as indicated. Metformin on hold   #4 Coronary artery disease/stable/asymptomatic  Continue aspirin postoperatively, Coreg  Had a recent 2-D echo will not repeat    #5 perioperative mortality discussed this with the patient. She remains at high risk for perioperative morbidity and mortality given multiple cardiovascular risk factors, she remains at risk of atrial fibrillation with rapid ventricular response postoperatively, however hemodynamically stable postoperatively for now    #6 wound care  Wound type: venous stasis ulcer right lateral leg, did not require debridement in the OR yesterday. Verified with PA that ok to reapply 4layer compression wrap to right LE.  Wound bed: pink, moist with  thin fibrinous layer on top  Drainage (amount, consistency, odor) moderate serous drainage on dressing  Periwound: intact  Dressing procedure/placement/frequency: Added hydrofiber under foam dressing for maximum absorption, then applied 4 layer compression wrap to the right LE  Will follow weekly for dressing and compression wrap change Q Friday    #7 disposition  Possible discharge to SNF Monday, inpatient rehabilitation versus SNF  HPI/Subjective:  negative for Chest Pain  negative for Shortness of Breath  negative for Nausea/Vomiting  negative for Calf Pain     Objective: Filed Vitals:   01/18/12 1441 01/18/12 2059 01/18/12 2239 01/19/12 0826  BP: 132/54 129/61  118/47  Pulse: 70 78  85  Temp: 98.4 F (36.9 C) 99.9 F (37.7 C) 99.1 F (37.3 C) 98.2 F (36.8 C)  TempSrc: Oral Oral  Oral  Resp: 18 16  94  SpO2: 96% 97%      Intake/Output Summary (Last 24 hours) at 01/19/12 0922 Last data filed at 01/18/12 1950  Gross per 24 hour  Intake    480 ml  Output      3 ml  Net    477 ml    Exam:  HENT:  Head: Atraumatic.  Nose: Nose normal.  Mouth/Throat: Oropharynx is clear and moist.  Eyes: Conjunctivae are normal. Pupils are equal, round, and reactive to light. No scleral icterus.  Neck: Neck supple. No tracheal deviation present.  Cardiovascular: Normal rate, regular rhythm, normal heart sounds and intact distal pulses.  Pulmonary/Chest: Effort normal and breath sounds normal. No respiratory distress.  Abdominal: Soft. Normal appearance and bowel sounds are normal. She exhibits no distension. There is no tenderness.  Musculoskeletal: She exhibits no edema and no  tenderness.  Neurological: She is alert. No cranial nerve deficit.    Data Reviewed: Basic Metabolic Panel:  Lab 01/18/12 6962 01/17/12 0530 01/15/12 1337 01/15/12 1113 01/15/12 0535  NA 135 137 136 137 140  K 4.5 4.3 4.1 4.0 4.0  CL 102 103 101 102 104  CO2 23 26 27 26 26   GLUCOSE 166* 145* 260*  196* 136*  BUN 18 13 19 18 16   CREATININE 0.87 0.77 0.86 0.80 0.80  CALCIUM 8.9 8.7 9.1 9.0 9.5  MG -- -- -- -- --  PHOS -- -- -- -- --    Liver Function Tests:  Lab 01/18/12 0640 01/17/12 0530 01/15/12 1337 01/15/12 0535  AST 16 13 11 11   ALT 7 7 6 6   ALKPHOS 61 41 47 53  BILITOT 0.4 0.4 0.2* 0.3  PROT 5.6* 5.4* 5.7* 6.3  ALBUMIN 2.4* 2.4* 2.6* 2.9*   No results found for this basename: LIPASE:5,AMYLASE:5 in the last 168 hours No results found for this basename: AMMONIA:5 in the last 168 hours  CBC:  Lab 01/18/12 0640 01/17/12 0530 01/16/12 0450 01/15/12 1337 01/15/12 0535  WBC 10.0 7.7 6.7 5.7 6.9  NEUTROABS -- -- -- -- --  HGB 8.1* 8.1* 9.0* 8.9* 9.6*  HCT 25.0* 24.9* 27.3* 27.4* 29.4*  MCV 94.0 93.3 93.2 94.8 93.0  PLT 230 234 247 252 297    Cardiac Enzymes:  Lab 01/15/12 1113  CKTOTAL --  CKMB --  CKMBINDEX --  TROPONINI <0.30   BNP (last 3 results)  Basename 01/14/12 1938 10/22/11 0341  PROBNP 3478.0* 5797.0*     CBG:  Lab 01/19/12 0715 01/18/12 2158 01/18/12 1633 01/18/12 1133 01/18/12 0703  GLUCAP 142* 183* 185* 209* 153*    Recent Results (from the past 240 hour(s))  SURGICAL PCR SCREEN     Status: Normal   Collection Time   01/14/12 10:37 PM      Component Value Range Status Comment   MRSA, PCR NEGATIVE  NEGATIVE Final    Staphylococcus aureus NEGATIVE  NEGATIVE Final   URINE CULTURE     Status: Normal   Collection Time   01/15/12  3:58 PM      Component Value Range Status Comment   Specimen Description URINE, CLEAN CATCH   Final    Special Requests NONE   Final    Culture  Setup Time 01/15/2012 16:56   Final    Colony Count >=100,000 COLONIES/ML   Final    Culture YEAST   Final    Report Status 01/17/2012 FINAL   Final      Studies: Dg Chest Port 1 View  01/15/2012  *RADIOLOGY REPORT*  Clinical Data: Preoperative respiratory exam.  Hypertension and diabetes.  PORTABLE CHEST - 1 VIEW  Comparison: 10/21/2011  Findings: Artifact overlies  chest.  Heart size is normal.  Lungs are clear.  There is chronic elevation of the right hemidiaphragm. There is chronic right paratracheal prominence related to intrathoracic extension of a thyroid goiter.  IMPRESSION: No active disease.  Chronic elevated right hemidiaphragm.  Chronic intrathoracic extension of thyroid goiter.   Original Report Authenticated By: Thomasenia Sales, M.D.     Scheduled Meds:   . bisacodyl  5 mg Oral QAC supper  . brimonidine  1 drop Both Eyes BID  . carvedilol  12.5 mg Oral BID WC  . cephALEXin  500 mg Oral Q8H  . docusate sodium  100 mg Oral BID  . enoxaparin (LOVENOX) injection  40 mg  Subcutaneous Q24H  . insulin aspart  0-15 Units Subcutaneous TID WC  . losartan  25 mg Oral Daily  . magnesium oxide  400 mg Oral BID  . sodium chloride  3 mL Intravenous Q12H  . timolol  1 drop Both Eyes BID  . vancomycin  1,250 mg Intravenous Q24H  . warfarin  6 mg Oral ONCE-1800  . Warfarin - Pharmacist Dosing Inpatient   Does not apply q1800  . DISCONTD: docusate sodium  100 mg Oral BID  . DISCONTD: vancomycin  1,000 mg Intravenous Q12H   Continuous Infusions:   Active Problems:  Coronary artery disease  Chronic anticoagulation  Atrial fibrillation with controlled ventricular response  Diabetes mellitus type 2, controlled  Leg pain  Venous stasis ulcers    Time spent: 40 minutes   [facility]  [facility] Pager [phone]. If 8PM-8AM, please contact night-coverage at [email or web address], password [facility] 01/19/2012, 9:22 AM  LOS: 5 days

## 2012-01-20 LAB — GLUCOSE, CAPILLARY

## 2012-01-20 LAB — PROTIME-INR
INR: 1.35 (ref 0.00–1.49)
Prothrombin Time: 16.4 seconds — ABNORMAL HIGH (ref 11.6–15.2)

## 2012-01-20 LAB — CBC
Hemoglobin: 7.7 g/dL — ABNORMAL LOW (ref 12.0–15.0)
MCHC: 32 g/dL (ref 30.0–36.0)
Platelets: 244 10*3/uL (ref 150–400)
RDW: 14.4 % (ref 11.5–15.5)

## 2012-01-20 MED ORDER — WARFARIN SODIUM 7.5 MG PO TABS
7.5000 mg | ORAL_TABLET | Freq: Once | ORAL | Status: DC
  Filled 2012-01-20: qty 1

## 2012-01-20 MED ORDER — DIPHENHYDRAMINE HCL 50 MG/ML IJ SOLN
25.0000 mg | Freq: Three times a day (TID) | INTRAMUSCULAR | Status: DC | PRN
Start: 2012-01-20 — End: 2012-01-20

## 2012-01-20 MED ORDER — ENOXAPARIN SODIUM 40 MG/0.4ML ~~LOC~~ SOLN: 40.0000 mg | SUBCUTANEOUS | Status: DC

## 2012-01-20 NOTE — Progress Notes (Signed)
CARE MANAGEMENT NOTE 01/20/2012  Patient:  Kathryn Schroeder, Kathryn Schroeder   Account Number:  [id number]  Date Initiated:  01/17/2012  Documentation initiated by:  Vance Peper  Subjective/Objective Assessment:   78 yr old female s/p left BKA     Action/Plan:   Progession of care and discharge planning. Patient is for possible CIR. waiting for MD to see. 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   Anticipated DC Date:  01/21/2012   Anticipated DC Plan:  SKILLED NURSING FACILITY  In-house referral  Clinical Social Worker      DC Planning Services  CM consult      Choice offered to / List presented to:             Status of service:  Completed, signed off Medicare Important Message given?   (If response is "NO", the following Medicare IM given date fields will be blank) Date Medicare IM given:   Date Additional Medicare IM given:    Discharge Disposition:  SKILLED NURSING FACILITY  Per UR Regulation:    If discussed at Long Length of Stay Meetings, dates discussed:    Comments:  01/20/12 0944 Vance Peper, RN BSN Case Manager Patient's family wants her to go to [facility] and rehab, Social Worker will follow.

## 2012-01-20 NOTE — Progress Notes (Signed)
Physical Therapy Treatment Patient Details Name: Kathryn Schroeder MRN: [id number] DOB: Jan 06, 1931 Today's Date: 01/20/2012 Time: 1191-4782 PT Time Calculation (min): 29 min  PT Assessment / Plan / Recommendation Comments on Treatment Session  Pt able to demonstrate exercises that she has been performing without requiring cues for technique.  Good recall of information from prior sessions.  Pt still very motivated; extensive conversation with patient and daughter about expectations and recovery as well as factors that influence recovery time.    Follow Up Recommendations  Post acute inpatient     Does the patient have the potential to tolerate intense rehabilitation  Yes, Recommend IP Rehab Screening  Barriers to Discharge        Equipment Recommendations  Rolling walker with 5" wheels;3 in 1 bedside comode;Wheelchair (measurements);Wheelchair cushion (measurements)    Recommendations for Other Services Rehab consult  Frequency 7X/week   Plan Discharge plan remains appropriate;Frequency remains appropriate    Precautions / Restrictions Precautions Precautions: Other (comment) (positioning to prevent contractures) Restrictions Weight Bearing Restrictions: Yes LLE Weight Bearing: Non weight bearing Other Position/Activity Restrictions: no pillow under thigh/knee   Pertinent Vitals/Pain 0/10 reported    Mobility  Bed Mobility Bed Mobility: Rolling Left;Left Sidelying to Sit;Sitting - Scoot to [place] of Bed Rolling Left: 4: Min assist;With rail Left Sidelying to Sit: HOB flat;3: Mod assist Sitting - Scoot to Edge of Bed: 4: Min assist Details for Bed Mobility Assistance: Assist for trunk support during hip rotation to sit Transfers Transfers: Sit to Stand;Stand to [employer] Transfers Sit to Stand: 1: +2 Total assist;3: Mod assist;From bed;From elevated surface Sit to Stand: Patient Percentage: 50% Stand to Sit: 1: +2 Total assist;3: Mod assist;With upper extremity  assist;To bed;To chair/3-in-1 Stand to Sit: Patient Percentage: 50% Stand Pivot Transfers: 1: +2 Total assist;2: Max assist;From elevated surface Stand Pivot Transfers: Patient Percentage: 30% Details for Transfer Assistance: VC's for upright posture; and wbing through RLE; manual assist to mainatin upright and to perform pivot; manual cues for hand placement  Ambulation/Gait Ambulation/Gait Assistance: Not tested (comment)    Exercises Amputee Exercises Quad Sets: Strengthening;Left;5 reps (2-3 second holds; VCs and manual cues required) Gluteal Sets: Strengthening (VC's for technique) Straight Leg Raises: Strengthening;Left;Other reps (comment) (3-5 reps performed intermittantly throughout session) Chair Push Up: Strengthening;Seated (VC's for technique )     PT Goals Acute Rehab PT Goals PT Goal Formulation: With patient Time For Goal Achievement: 01/22/12 Potential to Achieve Goals: Fair Pt will go Supine/Side to Sit: with modified independence PT Goal: Supine/Side to Sit - Progress: Progressing toward goal Pt will Transfer Bed to Chair/Chair to Bed: with modified independence PT Transfer Goal: Bed to Chair/Chair to Bed - Progress: Progressing toward goal Pt will Stand: with min assist Pt will Ambulate: 1 - 15 feet;with modified independence;with rolling walker Pt will Go Up / Down Stairs: Flight;with min assist  Visit Information  Last PT Received On: 01/20/12 Assistance Needed: +2    Subjective Data  Subjective: There really isn't any pain right now Patient Stated Goal: To work hard and get better   Cognition  Overall Cognitive Status: Appears within functional limits for tasks assessed/performed Arousal/Alertness: Awake/alert Orientation Level: Appears intact for tasks assessed Behavior During Session: [facility] for tasks performed    Balance  Balance Balance Assessed: Yes Static Sitting Balance Static Sitting - Balance Support: Feet supported (VC's to maintain RLE  support on floor) Static Standing Balance Static Standing - Balance Support: Bilateral upper extremity supported;During functional activity Static Standing -  Level of Assistance: 1: +2 Total assist;Patient percentage (comment)  End of Session PT - End of Session Equipment Utilized During Treatment: Gait belt Activity Tolerance: Patient tolerated treatment well;Patient limited by fatigue;Patient limited by pain Patient left: in chair;with call bell/phone within reach Nurse Communication: Mobility status   GP     Fabio Asa 01/20/2012, 1:14 PM Charlotte Crumb, PT DPT  [phone]

## 2012-01-20 NOTE — Progress Notes (Signed)
ANTICOAGULATION CONSULT NOTE - Follow up Consult  Pharmacy Consult for Coumadin Indication:  VTE prophylaxis/AFib  Allergies  Allergen Reactions  . Bactrim (Sulfamethoxazole W-Trimethoprim) Hives  . Janumet (Sitagliptin-Metformin Hcl) Nausea Only, Swelling and Other (See Comments)    weakness  . Adhesive (Tape)     WHITE tape: Causes blisters and skin reaction  . Ciprofloxacin Other (See Comments)    unknown  . Percocet (Oxycodone-Acetaminophen) Other (See Comments)    Makes her crazy  . Sulfa Antibiotics Hives    Hyperkalemia with bactrim  . Penicillins Itching and Rash    Childhood allergy:     Patient Measurements:   Vital Signs: Temp: 98.7 F (37.1 C) (10/14 0542) Temp src: Oral (10/14 0542) BP: 126/85 mmHg (10/14 0542) Pulse Rate: 81  (10/14 0542)  Labs:  Basename 01/20/12 0522 01/19/12 0648 01/18/12 0640  HGB 7.7* -- 8.1*  HCT 24.1* -- 25.0*  PLT 244 -- 230  APTT -- -- --  LABPROT 16.4* 16.5* 16.2*  INR 1.35 1.37 1.33  HEPARINUNFRC -- -- --  CREATININE -- -- 0.87  CKTOTAL -- -- --  CKMB -- -- --  TROPONINI -- -- --    The CrCl is unknown because both a height and weight (above a minimum accepted value) are required for this calculation.   Assessment: 78 yo female s/p L-BKA on 01/16/12 on Coumadin which she takes for Atrial Fibrillation.  Her home dose of Coumadin is 5 mg daily, except 2.5 mg on Tue & Sat.  Of note, pt received Vit K 5 mg PO x1 on 10/9. INR today is subtherapeutic. Pt is also on enoxaparin 40 mg SQ q24h until INR >1.8. No bleeding noted but Hgb down to 7.7. Plts wnl.   Goal of Therapy:  INR 2-3  Monitor platelets by anticoagulation protocol: Yes   Plan:  1.  Coumadin 7.5 mg PO x1 tonight 2.  F/u INR in the AM   Thank you,  Brett Fairy, PharmD 01/20/2012 9:17 AM

## 2012-01-20 NOTE — Progress Notes (Signed)
I met with patient and a daughter at bedside. Family have been touring facilities over the weekend for patient will have only intermittant assistance at home after d/c. Patient states she can not return home until she receives her prosthesis. Family wants patient to d/c to [facility] and rehab in [place], [place]. Second and third choices are [place] and [facility]. I have alerted SW and RN CM of this change in plans.

## 2012-01-20 NOTE — Discharge Summary (Signed)
Physician Discharge Summary  Kathryn Schroeder MRN: [id number] DOB/AGE: Dec 12, 1930 78 y.o.  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      aspirin 81 MG tablet   Take 81 mg by mouth every morning.      carvedilol 12.5 MG tablet   Commonly known as: COREG   Take 12.5 mg by mouth 2 (two) times daily with a meal.      cephALEXin 500 MG capsule   Commonly known as: KEFLEX   Take 1 capsule (500 mg total) by mouth every 8 (eight) hours.      COMBIGAN 0.2-0.5 % ophthalmic solution   Generic drug: brimonidine-timolol   Place 1 drop into both eyes 2 (two) times daily.      Fish Oil 1000 MG Caps   Take 1 capsule by mouth every morning.      losartan 25 MG tablet   Commonly known as: COZAAR   Take 25 mg by mouth daily.      magnesium oxide 400 MG tablet   Commonly known as: MAG-OX   Take 400 mg by mouth 2 (two) times daily.      metFORMIN 500 MG tablet   Commonly known as: GLUCOPHAGE   Take 1,000 mg by mouth daily.      RED YEAST RICE PO   Take 1 tablet by mouth at bedtime.      senna-docusate 8.6-50 MG per tablet   Commonly known as: Senokot-S   Take 1 tablet by mouth at bedtime as needed.      traMADol 50 MG tablet   Commonly known as: ULTRAM   Take 50 mg by mouth every 8 (eight) hours as needed. For pain      Vitamin D3 400 UNITS Caps     Take 1 tablet by mouth daily.      warfarin 5 MG tablet   Commonly known as: COUMADIN   Take 0.5-1 tablets (2.5-5 mg total) by mouth daily. Patient takes 5 mg daily except 2.5 mg on Tuesday's and Saturday's      lovenox 40 mg sq daily       Discharge Condition: stable Disposition: 01-Home or Self Care   Consults:   Valeria Batman, MD   Significant Diagnostic Studies: Dg Chest Port 1 View  01/15/2012  *RADIOLOGY REPORT*  Clinical Data: Preoperative respiratory exam.  Hypertension and diabetes.  PORTABLE CHEST - 1 VIEW  Comparison: 10/21/2011  Findings: Artifact overlies chest.  Heart size is normal.  Lungs are clear.  There is chronic elevation of the right hemidiaphragm. There is chronic right paratracheal prominence related to intrathoracic extension of a thyroid goiter.  IMPRESSION: No active disease.  Chronic elevated right hemidiaphragm.  Chronic intrathoracic extension of  thyroid goiter.   Original Report Authenticated By: Thomasenia Sales, M.D.       Microbiology: Recent Results (from the past 240 hour(s))  SURGICAL PCR SCREEN     Status: Normal   Collection Time   01/14/12 10:37 PM      Component Value Range Status Comment   MRSA, PCR NEGATIVE  NEGATIVE Final    Staphylococcus aureus NEGATIVE  NEGATIVE Final   URINE CULTURE     Status: Normal   Collection Time   01/15/12  3:58 PM      Component Value Range Status Comment   Specimen Description URINE, CLEAN CATCH   Final    Special Requests NONE   Final    Culture  Setup Time 01/15/2012 16:56   Final    Colony Count >=100,000 COLONIES/ML   Final    Culture YEAST   Final    Report Status 01/17/2012 FINAL   Final      Labs: Results for orders placed during the hospital encounter of 01/14/12 (from the past 48 hour(s))  GLUCOSE, CAPILLARY     Status: Abnormal   Collection Time   01/18/12  4:33 PM      Component Value Range Comment   Glucose-Capillary 185 (*) 70 - 99 mg/dL   GLUCOSE, CAPILLARY     Status:  Abnormal   Collection Time   01/18/12  9:58 PM      Component Value Range Comment   Glucose-Capillary 183 (*) 70 - 99 mg/dL   PROTIME-INR     Status: Abnormal   Collection Time   01/19/12  6:48 AM      Component Value Range Comment   Prothrombin Time 16.5 (*) 11.6 - 15.2 seconds    INR 1.37  0.00 - 1.49   GLUCOSE, CAPILLARY     Status: Abnormal   Collection Time   01/19/12  7:15 AM      Component Value Range Comment   Glucose-Capillary 142 (*) 70 - 99 mg/dL   GLUCOSE, CAPILLARY     Status: Abnormal   Collection Time   01/19/12 11:45 AM      Component Value Range Comment   Glucose-Capillary 223 (*) 70 - 99 mg/dL   GLUCOSE, CAPILLARY     Status: Abnormal   Collection Time   01/19/12  4:50 PM      Component Value Range Comment   Glucose-Capillary 130 (*) 70 - 99 mg/dL   GLUCOSE, CAPILLARY     Status: Abnormal   Collection Time   01/19/12 10:11 PM      Component Value Range Comment   Glucose-Capillary 137 (*) 70 - 99 mg/dL   PROTIME-INR     Status: Abnormal   Collection Time   01/20/12  5:22 AM      Component Value Range Comment   Prothrombin Time 16.4 (*) 11.6 - 15.2 seconds    INR 1.35  0.00 - 1.49   CBC     Status: Abnormal   Collection Time   01/20/12  5:22 AM      Component Value Range Comment   WBC 8.2  4.0 - 10.5 K/uL    RBC 2.58 (*) 3.87 - 5.11 MIL/uL    Hemoglobin 7.7 (*) 12.0 - 15.0 g/dL    HCT 19.1 (*) 47.8 - 46.0 %    MCV 93.4  78.0 - 100.0 fL    MCH 29.8  26.0 - 34.0 pg    MCHC 32.0  30.0 - 36.0 g/dL  RDW 14.4  11.5 - 15.5 %    Platelets 244  150 - 400 K/uL   GLUCOSE, CAPILLARY     Status: Abnormal   Collection Time   01/20/12  7:13 AM      Component Value Range Comment   Glucose-Capillary 145 (*) 70 - 99 mg/dL   GLUCOSE, CAPILLARY     Status: Abnormal   Collection Time   01/20/12 11:33 AM      Component Value Range Comment   Glucose-Capillary 208 (*) 70 - 99 mg/dL    Comment 1 Documented in Chart      Comment 2 Notify RN        HPI  : 78 year old female patient of Dr. Cleophas Dunker, who also has a ruptured Achilles tendon on the left lower extremities as well as venous stasis ulcers. After much discussion and extensive treatments the team decided the patient needs an amputation. She was sent to the hospital for this. The hospitalist service  Was requested to admit her provide cardiac clearance. The patient is on Coumadin outpatient she stop this since Saturday.  The patient does have a history of coronary artery disease, with remote stress test done approximately 2010. This was normal. The patient say she does not get chest pains, she was admitted here in July of 2013 with congestive heart failure. 2-D echo done at that time should the ER from 60%. Patient has no issues since. No shortness of breath, no palpitations and no chest pains. She walks with a walker, and does limited housework.   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 on 01/16/12  patient admitted to telemetry  Physical Therapy as ordered Non Weight Bearing (NWB) left leg  Inpatient Rehab possibly Monday  HHPT, HHRN, Walker and 3-in-1 comode seat   change dressing on stump next 2-3 days per ortho recommendations  Given stasis ulcer on the right leg, will continue antibiotics, based on previous wound culture the patient has been changed to cephalexin for another 2 weeks,     #2 Atrial fibrillation with controlled ventricular response  Continue home medications for rate control per particularly  INR 1.32, Lovenox and Coumadin have been resumed  Status post vitamin K prior to surgery  Dc lovenox when INR > 2.0 Requiring large doses of coumadin , will get 7.5 mg tonight Check daily INR  After stable q 3 days, the weekly    . #3 Diabetes mellitus type 2, controlled,  HBA1C is 7.9. CBGs remained somewhat elevated, so she is being managed with SSI/Lantus, adjusted as indicated.  Metformin resume after dc  #4 Coronary artery disease/stable/asymptomatic  Continue aspirin postoperatively, Coreg   #6 wound care  Wound type: venous stasis ulcer right lateral leg, did not require debridement in the OR yesterday. Verified with PA that ok to reapply 4layer compression wrap to right LE.  Wound bed: pink, moist with thin fibrinous layer on top  Drainage (amount, consistency, odor) moderate serous drainage on dressing  Periwound: intact  Dressing procedure/placement/frequency: Added hydrofiber under foam dressing for maximum absorption, then applied 4 layer compression wrap to the right LE  Will follow weekly for dressing and compression wrap change Q Friday    #7 disposition  Possible discharge to [facility] Monday   Discharge Exam:  Blood pressure 126/85, pulse 81, temperature 98.7 F (37.1 C), temperature source Oral, resp. rate 18, SpO2 94.00%.  HENT:  Head: Normocephalic.  Eyes:  Pupils round and reactive to light  Neck: Neck supple. No thyromegaly present.  Cardiovascular:  Cardiac rate controlled  Pulmonary/Chest: Breath sounds normal. She has no wheezes.  Abdominal: Bowel sounds are normal. She exhibits no distension. There is no tenderness.  Neurological:  Patient resting comfortably and easy to arouse. She followed two-step commands. She was able to provide her name date of birth and age. ue's 5/5. RLE grossly 3-4/5, LLE not tested for strength.  Skin:  Surgical site clean and dry. Note ischemic changes to right lower extremity, leg in unna wrap          Discharge Orders    Future Orders Please Complete By Expires   Diet - low sodium heart healthy      Increase activity slowly         Follow-up Information    Follow up with [facility], Claude Manges, MD. Schedule an appointment as soon as possible for a visit in 2 weeks.   Contact information:   [address] [place] [address] [phone]       Follow up with Romeo Rabon, MD. Schedule an  appointment as soon as possible for a visit in 1 week.   Contact information:   [address] [place] [address] [phone]          Signed: Richarda Overlie 01/20/2012, 12:41 PM

## 2012-01-21 NOTE — Clinical Social Work Psychosocial (Addendum)
    Clinical Social Work Department BRIEF PSYCHOSOCIAL ASSESSMENT 01/21/2012  Patient:  Kathryn Schroeder, Kathryn Schroeder     Account Number:  [id number]     Admit date:  01/14/2012  Clinical Social Worker:  Tiburcio Pea  Date/Time:  01/20/2012 10:00 AM  Referred by:  Physician  Date Referred:  01/17/2012 Referred for  Other - See comment   Other Referral:   CIR vs SNF   Interview type:  Other - See comment Other interview type:   Patient and daughter    PSYCHOSOCIAL DATA Living Status:  ALONE Admitted from facility:   Level of care:   Primary support name:  Kathryn Schroeder Primary support relationship to patient:  CHILD, ADULT Degree of support available:   Strong Support by family    CURRENT CONCERNS Current Concerns  Post-Acute Placement   Other Concerns:   CIR had assessed patient on Friday 01/17/12 and deemed her to be appropriate for CIR. Planned to accept on 01/20/12.    SOCIAL WORK ASSESSMENT / PLAN Patient was scheduled for d/c to [facility] CIR today but the bed offer was rescinded when it was determined that patient would not have 24 hour help at home at d/c and patient's family was seeking SNF placmement after rehab. Weekened CSW staff was requested to discuss SNF as an "back up plan" to CIR but this was not done as they saw CIR's note stating planned d/c to CIR today.  Due to removal of bed offer; SNF bed search was intiated today. Family requested [facility] in [place]. They had already contacted the facility.  Secondary choices were [facility] or the [facility]. Bed search  in place.   Assessment/plan status:  Psychosocial Support/Ongoing Assessment of Needs Other assessment/ plan:   Information/referral to community resources:   SNF list provided    PATIENT'S/FAMILY'S RESPONSE TO PLAN OF CARE: Patient and daughters are agreeable to short term SNF. They had hoped for CIR placement but are understanding of need to  change plan to SNF. Family is invovled and very responsive to pateint's care needs.

## 2012-01-21 NOTE — Clinical Social Work Placement (Signed)
     Clinical Social Work Department CLINICAL SOCIAL WORK PLACEMENT NOTE 01/21/2012  Patient:  SHADA, NIENABER A  Account Number:  [id number] Admit date:  01/14/2012  Clinical Social Worker:  Lupita Leash Francisca Langenderfer, LCSWA  Date/time:  01/20/2012 01:00 AM  Clinical Social Work is seeking post-discharge placement for this patient at the following level of care:   SKILLED NURSING   (*CSW will update this form in Epic as items are completed)   01/20/2012  Patient/family provided with [facility] Department of Clinical Social Works list of facilities offering this level of care within the geographic area requested by the patient (or if unable, by the patients family).  01/20/2012  Patient/family informed of their freedom to choose among providers that offer the needed level of care, that participate in Medicare, Medicaid or managed care program needed by the patient, have an available bed and are willing to accept the patient.  01/20/2012  Patient/family informed of [facility] ownership interest in [facility], as well as of the fact that they are under no obligation to receive care at this facility.  PASARR submitted to EDS on  PASARR number received from EDS on   FL2 transmitted to all facilities in geographic area requested by pt/family on  01/20/2012 FL2 transmitted to all facilities within larger geographic area on   Patient informed that his/her managed care company has contracts with or will negotiate with  certain facilities, including the following:   Bed search in [place]- PASARR not required     Patient/family informed of bed offers received:  01/20/2012 Patient chooses bed at  Physician recommends and patient chooses bed at    Patient to be transferred to  on  01/20/2012 Patient to be transferred to facility by ambulance  The following physician request were entered in Epic:   Additional Comments: Patient d/c'd to [facility] and Rehab on 01/20/12. This  was patient/family's choice for placement.  They were very pleased with this arrangement.

## 2012-10-03 ENCOUNTER — Encounter (HOSPITAL_COMMUNITY): Payer: Self-pay | Admitting: Emergency Medicine

## 2012-10-03 ENCOUNTER — Inpatient Hospital Stay (HOSPITAL_COMMUNITY)
Admission: EM | Admit: 2012-10-03 | Discharge: 2012-10-06 | DRG: 310 | Disposition: A | Discharge status: Home / Self Care | Payer: Medicare Other | Attending: Internal Medicine | Admitting: Internal Medicine

## 2012-10-03 DIAGNOSIS — I2510 Atherosclerotic heart disease of native coronary artery without angina pectoris: Secondary | ICD-10-CM

## 2012-10-03 DIAGNOSIS — R001 Bradycardia, unspecified: Secondary | ICD-10-CM

## 2012-10-03 DIAGNOSIS — E119 Type 2 diabetes mellitus without complications: Secondary | ICD-10-CM

## 2012-10-03 DIAGNOSIS — Z7901 Long term (current) use of anticoagulants: Secondary | ICD-10-CM

## 2012-10-03 DIAGNOSIS — S88119A Complete traumatic amputation at level between knee and ankle, unspecified lower leg, initial encounter: Secondary | ICD-10-CM

## 2012-10-03 DIAGNOSIS — Z96649 Presence of unspecified artificial hip joint: Secondary | ICD-10-CM

## 2012-10-03 DIAGNOSIS — I10 Essential (primary) hypertension: Secondary | ICD-10-CM

## 2012-10-03 DIAGNOSIS — R634 Abnormal weight loss: Secondary | ICD-10-CM | POA: Diagnosis present

## 2012-10-03 DIAGNOSIS — E785 Hyperlipidemia, unspecified: Secondary | ICD-10-CM | POA: Diagnosis present

## 2012-10-03 DIAGNOSIS — I4891 Unspecified atrial fibrillation: Secondary | ICD-10-CM

## 2012-10-03 DIAGNOSIS — Z9861 Coronary angioplasty status: Secondary | ICD-10-CM

## 2012-10-03 DIAGNOSIS — M129 Arthropathy, unspecified: Secondary | ICD-10-CM | POA: Diagnosis present

## 2012-10-03 DIAGNOSIS — I252 Old myocardial infarction: Secondary | ICD-10-CM

## 2012-10-03 DIAGNOSIS — R55 Syncope and collapse: Secondary | ICD-10-CM | POA: Diagnosis present

## 2012-10-03 DIAGNOSIS — I498 Other specified cardiac arrhythmias: Principal | ICD-10-CM

## 2012-10-03 LAB — COMPREHENSIVE METABOLIC PANEL
ALT: 32 U/L (ref 0–35)
AST: 36 U/L (ref 0–37)
Albumin: 3.6 g/dL (ref 3.5–5.2)
Alkaline Phosphatase: 59 U/L (ref 39–117)
Calcium: 9.4 mg/dL (ref 8.4–10.5)
GFR calc Af Amer: 64 mL/min — ABNORMAL LOW (ref >90)
Glucose, Bld: 190 mg/dL — ABNORMAL HIGH (ref 70–99)
Potassium: 4.9 mEq/L (ref 3.5–5.1)
Sodium: 138 mEq/L (ref 135–145)
Total Protein: 7 g/dL (ref 6.0–8.3)

## 2012-10-03 LAB — URINALYSIS, ROUTINE W REFLEX MICROSCOPIC
Bilirubin Urine: NEGATIVE
Ketones, ur: NEGATIVE mg/dL
Leukocytes, UA: NEGATIVE
Nitrite: NEGATIVE
Protein, ur: NEGATIVE mg/dL

## 2012-10-03 LAB — CBC WITH DIFFERENTIAL/PLATELET
Basophils Absolute: 0 10*3/uL (ref 0.0–0.1)
Eosinophils Absolute: 0.1 10*3/uL (ref 0.0–0.7)
Lymphs Abs: 1.1 10*3/uL (ref 0.7–4.0)
MCH: 31 pg (ref 26.0–34.0)
Neutrophils Relative %: 74 % (ref 43–77)
Platelets: 146 10*3/uL — ABNORMAL LOW (ref 150–400)
RBC: 4.39 MIL/uL (ref 3.87–5.11)
RDW: 13.3 % (ref 11.5–15.5)
WBC: 5.9 10*3/uL (ref 4.0–10.5)

## 2012-10-03 LAB — PROTIME-INR: INR: 1.25 (ref 0.00–1.49)

## 2012-10-03 LAB — MAGNESIUM: Magnesium: 1.6 mg/dL (ref 1.5–2.5)

## 2012-10-03 LAB — SAMPLE TO BLOOD BANK

## 2012-10-03 LAB — TROPONIN I
Troponin I: <0.3 ng/mL (ref <0.30)
Troponin I: <0.3 ng/mL

## 2012-10-03 MED ORDER — INSULIN ASPART 100 UNIT/ML ~~LOC~~ SOLN
0.0000 [IU] | Freq: Three times a day (TID) | SUBCUTANEOUS | Status: DC
  Administered 2012-10-04 – 2012-10-05 (×3): 1 [IU] via SUBCUTANEOUS

## 2012-10-03 MED ORDER — WARFARIN - PHARMACIST DOSING INPATIENT: Freq: Every day | Status: DC

## 2012-10-03 MED ORDER — ASPIRIN 81 MG PO TABS: 81.0000 mg | ORAL_TABLET | ORAL | Status: DC

## 2012-10-03 MED ORDER — LOSARTAN POTASSIUM 25 MG PO TABS
25.0000 mg | ORAL_TABLET | Freq: Every day | ORAL | Status: DC
  Administered 2012-10-04 – 2012-10-06 (×3): 25 mg via ORAL
  Filled 2012-10-03 (×3): qty 1

## 2012-10-03 MED ORDER — SODIUM CHLORIDE 0.9 % IV SOLN: 250.0000 mL | INTRAVENOUS | Status: DC | PRN

## 2012-10-03 MED ORDER — CARVEDILOL 12.5 MG PO TABS
12.5000 mg | ORAL_TABLET | Freq: Two times a day (BID) | ORAL | Status: DC
  Filled 2012-10-03 (×3): qty 1

## 2012-10-03 MED ORDER — BRIMONIDINE TARTRATE-TIMOLOL 0.2-0.5 % OP SOLN: 1.0000 [drp] | Freq: Two times a day (BID) | OPHTHALMIC | Status: DC

## 2012-10-03 MED ORDER — METFORMIN HCL 500 MG PO TABS
500.0000 mg | ORAL_TABLET | Freq: Every day | ORAL | Status: DC
  Administered 2012-10-04 – 2012-10-05 (×2): 500 mg via ORAL
  Filled 2012-10-03 (×4): qty 1

## 2012-10-03 MED ORDER — SODIUM CHLORIDE 0.9 % IJ SOLN: 3.0000 mL | Freq: Two times a day (BID) | INTRAMUSCULAR | Status: DC

## 2012-10-03 MED ORDER — ONDANSETRON HCL 4 MG PO TABS: 4.0000 mg | ORAL_TABLET | Freq: Four times a day (QID) | ORAL | Status: DC | PRN

## 2012-10-03 MED ORDER — SENNOSIDES-DOCUSATE SODIUM 8.6-50 MG PO TABS
1.0000 | ORAL_TABLET | Freq: Every evening | ORAL | Status: DC | PRN
  Filled 2012-10-03: qty 1

## 2012-10-03 MED ORDER — TRAMADOL HCL 50 MG PO TABS
50.0000 mg | ORAL_TABLET | Freq: Three times a day (TID) | ORAL | Status: DC | PRN
  Filled 2012-10-03: qty 1

## 2012-10-03 MED ORDER — ASPIRIN 81 MG PO CHEW
81.0000 mg | CHEWABLE_TABLET | Freq: Every day | ORAL | Status: DC
  Administered 2012-10-04 – 2012-10-06 (×3): 81 mg via ORAL
  Filled 2012-10-03 (×3): qty 1

## 2012-10-03 MED ORDER — FISH OIL 1000 MG PO CAPS: 1.0000 | ORAL_CAPSULE | ORAL | Status: DC

## 2012-10-03 MED ORDER — SODIUM CHLORIDE 0.9 % IJ SOLN
3.0000 mL | Freq: Two times a day (BID) | INTRAMUSCULAR | Status: DC
  Administered 2012-10-03: 3 mL via INTRAVENOUS
  Administered 2012-10-04: 10:00:00 via INTRAVENOUS

## 2012-10-03 MED ORDER — TIMOLOL MALEATE 0.5 % OP SOLN
1.0000 [drp] | Freq: Two times a day (BID) | OPHTHALMIC | Status: DC
  Administered 2012-10-03 – 2012-10-06 (×6): 1 [drp] via OPHTHALMIC
  Filled 2012-10-03: qty 5

## 2012-10-03 MED ORDER — SODIUM CHLORIDE 0.9 % IJ SOLN: 3.0000 mL | INTRAMUSCULAR | Status: DC | PRN

## 2012-10-03 MED ORDER — ACETAMINOPHEN 325 MG PO TABS
650.0000 mg | ORAL_TABLET | Freq: Four times a day (QID) | ORAL | Status: DC | PRN
  Administered 2012-10-05: 650 mg via ORAL
  Filled 2012-10-03: qty 2

## 2012-10-03 MED ORDER — BRIMONIDINE TARTRATE 0.2 % OP SOLN
1.0000 [drp] | Freq: Two times a day (BID) | OPHTHALMIC | Status: DC
  Administered 2012-10-03 – 2012-10-06 (×6): 1 [drp] via OPHTHALMIC
  Filled 2012-10-03: qty 5

## 2012-10-03 MED ORDER — ONDANSETRON HCL 4 MG/2ML IJ SOLN: 4.0000 mg | Freq: Four times a day (QID) | INTRAMUSCULAR | Status: DC | PRN

## 2012-10-03 MED ORDER — WARFARIN SODIUM 7.5 MG PO TABS
7.5000 mg | ORAL_TABLET | Freq: Once | ORAL | Status: AC
  Administered 2012-10-03: 7.5 mg via ORAL
  Filled 2012-10-03: qty 1

## 2012-10-03 MED ORDER — OMEGA-3-ACID ETHYL ESTERS 1 G PO CAPS
1.0000 g | ORAL_CAPSULE | Freq: Every day | ORAL | Status: DC
  Administered 2012-10-04 – 2012-10-06 (×3): 1 g via ORAL
  Filled 2012-10-03 (×3): qty 1

## 2012-10-03 NOTE — Progress Notes (Signed)
Patient arrived to floor via stretcher from ED. Patient is alert, oriented, and denies any pain or discomfort. CHG wipes completed and oriented patient to her new room and surroundings. Heart monitor applied, vital signs obtained, and reviewed call bell with patient. She denies any needs at this time and let her know that we would be back in shortly for her to meet the night shift nurse. No other needs were expressed. Will continue to assess and monitor.

## 2012-10-03 NOTE — H&P (Signed)
[facility] History and Physical  Kathryn Schroeder WUJ:[id number] DOB: Feb 18, 1931 DOA: 10/03/2012  Referring physician: Dr. Fredderick Phenix PCP: Romeo Rabon, MD  Specialists: Dr. Elease Hashimoto  Chief Complaint: Syncope  HPI: Kathryn Schroeder is a 79 y.o. female past medical history as listed below who presents following a syncopal episode. She states that when she first sat up in bed this morning she had a funny feeling in her chest which just lasted a few seconds. Subsequently she reports that she sat down in the living room and began feeling dizzy and this worsened and so she told her daughter, who asked her to check her sugar. She states she went to the kitchen table and her that her daughter states that she then heard a banging noise, when she turned around her mother was shaking /jerking for a few minutes and then she slumped back in her wheelchair. Her daughter states that during the episode she was unresponsive, but she kept trying to arouse her and then she came to it, and was able to answer her. She states patient was not confused and there was no incontinence or tongue biting. She admits to feeling nauseous but no vomiting. EMS reports that when they arrived patient was clammy and she complained of feeling hot all over and that her heart rate was in the 30s . Also will Per EDP when they tried to stand patient up she also became bradycardic to the 30s and complained of feeling dizzy. She was seen in the ED and troponin was negative, cardiology was consulted and admission to [facility] requested.   Review of Systems: The patient denies anorexia, fever, vision loss, decreased hearing, hoarseness, , dyspnea on exertion, peripheral edema, balance deficits, hemoptysis, abdominal pain, melena, hematochezia, severe indigestion/heartburn, hematuria, incontinence,, suspicious skin lesions, transient blindness, difficulty walking, depression, unusual weight change, abnormal bleeding, enlarged lymph nodes,  angioedema, and breast masses.    Past Medical History  Diagnosis Date  . Coronary artery disease     STATUS POST OLD INFERIOR WALL M.I.  . Chronic anticoagulation   . Atrial fibrillation   . Hypertension   . Hyperlipidemia   . MI, old 2008  . Shortness of breath   . Diabetes mellitus     TYPE 2  . Arthritis     Goiter Past Surgical History  Procedure Laterality Date  . Appendectomy    . Total hip arthroplasty      RIGHT HIP  . Abdominal hysterectomy  1976    1976  . Cataract extraction      Bilateral  . Breast biopsy  2013  . Coronary angioplasty with stent placement    . Amputation  01/16/2012    Procedure: AMPUTATION BELOW KNEE;  Surgeon: Valeria Batman, MD;  Location: [facility] OR;  Service: Orthopedics;  Laterality: Left;  Left Below Knee Amputation   Social History:  reports that she has never smoked. She has never used smokeless tobacco. She reports that she does not drink alcohol or use illicit drugs.  where does patient live--home,   Allergies  Allergen Reactions  . Bactrim (Sulfamethoxazole W-Trimethoprim) Hives  . Janumet (Sitagliptin-Metformin Hcl) Nausea Only, Swelling and Other (See Comments)    weakness  . Adhesive (Tape)     WHITE tape: Causes blisters and skin reaction  . Ciprofloxacin Other (See Comments)    unknown  . Percocet (Oxycodone-Acetaminophen) Other (See Comments)    Makes her crazy  . Sulfa Antibiotics Hives    Hyperkalemia with bactrim  . Penicillins  Itching and Rash    Childhood allergy:     Family History  Problem Relation Age of Onset  . Cancer Father 31  . Cancer Mother   . Cancer Daughter   . Diabetes Daughter   . Hypertension Daughter   . Other Daughter     varicose veins    Prior to Admission medications   Medication Sig Start Date End Date Taking? Authorizing Provider  acetaminophen (TYLENOL) 325 MG tablet Take 2 tablets (650 mg total) by mouth every 6 (six) hours as needed (or Fever >/= 101). 10/25/11 10/24/12 Yes Laveda Norman, MD  aspirin 81 MG tablet Take 81 mg by mouth every morning.    Yes Historical Provider, MD  brimonidine-timolol (COMBIGAN) 0.2-0.5 % ophthalmic solution Place 1 drop into both eyes 2 (two) times daily.   Yes Historical Provider, MD  carvedilol (COREG) 12.5 MG tablet Take 12.5 mg by mouth 2 (two) times daily with a meal.     Yes Historical Provider, MD  Cholecalciferol (VITAMIN D3) 400 UNITS CAPS Take 1 tablet by mouth daily.    Yes Historical Provider, MD  losartan (COZAAR) 25 MG tablet Take 25 mg by mouth daily.   Yes Historical Provider, MD  magnesium oxide (MAG-OX) 400 MG tablet Take 400 mg by mouth 2 (two) times daily.     Yes Historical Provider, MD  metFORMIN (GLUCOPHAGE) 500 MG tablet Take 500 mg by mouth every evening.    Yes Historical Provider, MD  Omega-3 Fatty Acids (FISH OIL) 1000 MG CAPS Take 1 capsule by mouth every morning.    Yes Historical Provider, MD  Red Yeast Rice Extract (RED YEAST RICE PO) Take 1 tablet by mouth at bedtime.   Yes Historical Provider, MD  senna-docusate (SENOKOT-S) 8.6-50 MG per tablet Take 1 tablet by mouth at bedtime as needed. 01/19/12  Yes Richarda Overlie, MD  traMADol (ULTRAM) 50 MG tablet Take 50 mg by mouth every 8 (eight) hours as needed. For pain   Yes Historical Provider, MD  warfarin (COUMADIN) 5 MG tablet Take 2.5-5 mg by mouth daily. Patient takes 5 mg daily except 2.5 mg on Mondays, Wednesday and Fridays 10/25/11  Yes Laveda Norman, MD   Physical Exam: Filed Vitals:   10/03/12 1730 10/03/12 1745 10/03/12 1800 10/03/12 1815  BP: 170/60 171/80 176/66 173/62  Pulse: 65 65 57 59  Temp:      TempSrc:      Resp: 14 18 11 14   SpO2: 90% 100% 97% 96%    Constitutional: Vital signs reviewed.  Patient is a well-developed and well-nourished  in no acute distress and cooperative with exam. Alert and oriented x3.  Head: Normocephalic and atraumatic Ear: TM normal bilaterally Nose: No erythema or drainage noted.   Mouth: no erythema or exudates,  MMM Eyes: PERRL, EOMI, conjunctivae normal, No scleral icterus.  Neck: Supple, Trachea midline normal ROM, No JVD, mass, thyromegaly, or carotid bruit present.  Cardiovascular: Irregularly irregular, rate controlled,S1 normal, S2 normal, 2/6 systolic murmur, pulses symmetric and intact bilaterally Pulmonary/Chest: normal respiratory effort, CTAB, no wheezes, rales, or rhonchi Abdominal: Soft. Non-tender, non-distended, bowel sounds are normal, no masses, organomegaly, or guarding present.  GU: no CVA tenderness Extremities: No cyanosis and no edema, left BKA  Neurological: A&O x3, Strength is normal and symmetric bilaterally, cranial nerve II-XII are grossly intact, no focal motor deficit, sensory intact to light touch bilaterally.  Skin: Warm, dry and intact. No rash.  Psychiatric: Normal mood and affect. speech  and behavior is normal. Judgment and thought content normal.  Labs on Admission:  Basic Metabolic Panel:  Recent Labs Lab 10/03/12 1404  NA 138  K 4.9  CL 102  CO2 27  GLUCOSE 190*  BUN 22  CREATININE 0.96  CALCIUM 9.4  MG 1.6   Liver Function Tests:  Recent Labs Lab 10/03/12 1404  AST 36  ALT 32  ALKPHOS 59  BILITOT 0.8  PROT 7.0  ALBUMIN 3.6   No results found for this basename: LIPASE, AMYLASE,  in the last 168 hours No results found for this basename: AMMONIA,  in the last 168 hours CBC:  Recent Labs Lab 10/03/12 1404  WBC 5.9  NEUTROABS 4.4  HGB 13.6  HCT 39.4  MCV 89.7  PLT 146*   Cardiac Enzymes:  Recent Labs Lab 10/03/12 1404  TROPONINI <0.30    BNP (last 3 results)  Recent Labs  10/22/11 0341 01/14/12 1938  PROBNP 5797.0* 3478.0*   CBG:  Recent Labs Lab 10/03/12 1345 10/03/12 1831  GLUCAP 186* 166*    Radiological Exams on Admission: No results found.   Assessment/Plan Principal Problem:   Syncope -Bradycardia versus seizure - Obtain cardiac enzymes, 2-D echo and monitor on telemetry. -Start beta blocker and  follow -Cardiology consulted per EDP, following for further recommendations   Symptomatic Bradycardia -As discussed above, will hold beta blocker -Cycle cardiac enzymes, obtain 2-D echo and follow  -Cardiology consulted EDP for further recommendations -appreciate input Active Problems:   HTN (hypertension) -Continue Losartan, monitor and further treat accordingly with beta blocker on hold.   A-fib -Hold off beta blocker secondary to bradycardia -Continue anticoagulation with Coumadin   Diabetes -Monitor Accu-Cheks and cover with sliding scale insulin -Continue metformin History of CAD, s/p PCI in the past followed by Dr Elease Hashimoto   Code Status: full Family Communication: daughter at bedside Disposition Plan: admit to step down  Time spent: >25min  Kela Millin [facility] Pager [phone]  If 7PM-7AM, please contact night-coverage [email or web address] Password [facility] 10/03/2012, 6:45 PM

## 2012-10-03 NOTE — ED Notes (Signed)
Pt came from home, had a witnessed tonic clonic seizure. Pt has hx of A-fib, HTN, and DM. Pt is alert and oriented. CBG 185, 12 lead unremarkable. Pt HR was in the 30's irregular and variable. Pt had some nausea was given 4mg  zofran en route. Vitals 1160/111, resp 18-20. 20 g Rt wrist. Pt also complains of dizzy spells recently.

## 2012-10-03 NOTE — ED Provider Notes (Signed)
Discussed with hospitalist on call who will admit patient  Kathryn Bucco, MD 10/03/12 [phone]

## 2012-10-03 NOTE — ED Provider Notes (Signed)
History    CSN: [id number] Arrival date & time 10/03/12  1325  First MD Initiated Contact with Patient 10/03/12 1330     Chief Complaint  Patient presents with  . Seizures   (Consider location/radiation/quality/duration/timing/severity/associated sxs/prior Treatment)  HPI Patient states this morning about 9 AM when she first sat up when getting out of bed she had a funny feeling in her chest for a few seconds that she's never had before. Other than that she felt fine until later in the day when her daughter came over. She told her daughter she felt dizzy and she told me that was not a spinning sensation but a feeling like she was going to pass out. She went into the kitchen to check her blood sugar nd her daughter heard a banging noise and when she went in the room her mother sitting in a chair and shaking. She states it lasted less than a minute. She states the patient was very pale. After the jerking was over she was not responding normally for about 45 seconds and her face got very red. She did not appear to be confused when she woke up and continued to complain of not feeling well which meant nausea. She also complained of nausea but did not have vomiting. She has never passed out before. She denies headache, chest pain, or shortness of breath. EMS noted she felt clammy and patient complained of feeling hot. She states now she has mild nausea but she denies feeling weak. EMS reports her heart rate was in the 30s.   PCP Dr Oscar La in [place], [place] Cardiologist Dr Elease Hashimoto  Past Medical History  Diagnosis Date  . Coronary artery disease     STATUS POST OLD INFERIOR WALL M.I.  . Chronic anticoagulation   . Atrial fibrillation   . Hypertension   . Hyperlipidemia   . MI, old 2008  . Shortness of breath   . Diabetes mellitus     TYPE 2  . Arthritis    Past Surgical History  Procedure Laterality Date  . Appendectomy    . Total hip arthroplasty      RIGHT HIP  . Abdominal  hysterectomy  1976    1976  . Cataract extraction      Bilateral  . Breast biopsy  2013  . Coronary angioplasty with stent placement    . Amputation  01/16/2012    Procedure: AMPUTATION BELOW KNEE;  Surgeon: Valeria Batman, MD;  Location: [facility] OR;  Service: Orthopedics;  Laterality: Left;  Left Below Knee Amputation   Family History  Problem Relation Age of Onset  . Cancer Father 47  . Cancer Mother   . Cancer Daughter   . Diabetes Daughter   . Hypertension Daughter   . Other Daughter     varicose veins   History  Substance Use Topics  . Smoking status: Never Smoker   . Smokeless tobacco: Never Used  . Alcohol Use: No  lives at home Lives alone Uses a walker   OB History   Grav Para Term Preterm Abortions TAB SAB Ect Mult Living                 Review of Systems  All other systems reviewed and are negative.    Allergies  Bactrim; Janumet; Adhesive; Ciprofloxacin; Percocet; Sulfa antibiotics; and Penicillins  Home Medications   Current Outpatient Rx  Name  Route  Sig  Dispense  Refill  . acetaminophen (TYLENOL) 325 MG tablet  Oral   Take 2 tablets (650 mg total) by mouth every 6 (six) hours as needed (or Fever >/= 101).   100 tablet   0   . aspirin 81 MG tablet   Oral   Take 81 mg by mouth every morning.          . brimonidine-timolol (COMBIGAN) 0.2-0.5 % ophthalmic solution   Both Eyes   Place 1 drop into both eyes 2 (two) times daily.         . carvedilol (COREG) 12.5 MG tablet   Oral   Take 12.5 mg by mouth 2 (two) times daily with a meal.           . Cholecalciferol (VITAMIN D3) 400 UNITS CAPS   Oral   Take 1 tablet by mouth daily.          Marland Kitchen losartan (COZAAR) 25 MG tablet   Oral   Take 25 mg by mouth daily.         . magnesium oxide (MAG-OX) 400 MG tablet   Oral   Take 400 mg by mouth 2 (two) times daily.           . metFORMIN (GLUCOPHAGE) 500 MG tablet   Oral   Take 500 mg by mouth every evening.          . Omega-3  Fatty Acids (FISH OIL) 1000 MG CAPS   Oral   Take 1 capsule by mouth every morning.          . Red Yeast Rice Extract (RED YEAST RICE PO)   Oral   Take 1 tablet by mouth at bedtime.         . senna-docusate (SENOKOT-S) 8.6-50 MG per tablet   Oral   Take 1 tablet by mouth at bedtime as needed.   30 tablet   0   . traMADol (ULTRAM) 50 MG tablet   Oral   Take 50 mg by mouth every 8 (eight) hours as needed. For pain         . warfarin (COUMADIN) 5 MG tablet   Oral   Take 2.5-5 mg by mouth daily. Patient takes 5 mg daily except 2.5 mg on Mondays, Wednesday and Fridays          BP 180/97  Pulse 67  Temp(Src) 97.8 F (36.6 C) (Oral)  Resp 18  SpO2 96%  Vital signs normal   Physical Exam  Nursing note and vitals reviewed. Constitutional: She is oriented to person, place, and time. She appears well-developed and well-nourished.  Non-toxic appearance. She does not appear ill. No distress.  HENT:  Head: Normocephalic and atraumatic.  Right Ear: External ear normal.  Left Ear: External ear normal.  Nose: Nose normal. No mucosal edema or rhinorrhea.  Mouth/Throat: Oropharynx is clear and moist and mucous membranes are normal. No dental abscesses or edematous.  Eyes: Conjunctivae and EOM are normal. Pupils are equal, round, and reactive to light.  Neck: Normal range of motion and full passive range of motion without pain. Neck supple.  Cardiovascular: Normal rate, regular rhythm and normal heart sounds.  Exam reveals no gallop and no friction rub.   No murmur heard. Pulmonary/Chest: Effort normal and breath sounds normal. No respiratory distress. She has no wheezes. She has no rhonchi. She has no rales. She exhibits no tenderness and no crepitus.  Abdominal: Soft. Normal appearance and bowel sounds are normal. She exhibits no distension. There is no tenderness. There is no rebound and no  guarding.  Genitourinary:  Old hemorrhoid skin tags, stool is yellow/brown   Musculoskeletal: Normal range of motion. She exhibits no edema and no tenderness.  Moves all extremities well. Patient has a prosthetic left leg and has a left BKA  Neurological: She is alert and oriented to person, place, and time. She has normal strength. No cranial nerve deficit.  No cranial nerve deficit, equal grips bilaterally, no pronator drift, no weakness in her legs.  Skin: Skin is warm, dry and intact. No rash noted. No erythema. There is pallor.  Psychiatric: She has a normal mood and affect. Her speech is normal and behavior is normal. Her mood appears not anxious.    ED Course  Procedures (including critical care time)  When nurse was doing orthostatics she noted that patient dropped her heart rate into the 30s and complained of nausea.  16:23 Dr Mayford Knife will consult, have medicine admit  Dr Fredderick Phenix will talk to medicine  Results for orders placed during the hospital encounter of 10/03/12  GLUCOSE, CAPILLARY      Result Value Range   Glucose-Capillary 186 (*) 70 - 99 mg/dL  CBC WITH DIFFERENTIAL      Result Value Range   WBC 5.9  4.0 - 10.5 K/uL   RBC 4.39  3.87 - 5.11 MIL/uL   Hemoglobin 13.6  12.0 - 15.0 g/dL   HCT 16.1  09.6 - 04.5 %   MCV 89.7  78.0 - 100.0 fL   MCH 31.0  26.0 - 34.0 pg   MCHC 34.5  30.0 - 36.0 g/dL   RDW 40.9  81.1 - 91.4 %   Platelets 146 (*) 150 - 400 K/uL   Neutrophils Relative % 74  43 - 77 %   Neutro Abs 4.4  1.7 - 7.7 K/uL   Lymphocytes Relative 18  12 - 46 %   Lymphs Abs 1.1  0.7 - 4.0 K/uL   Monocytes Relative 7  3 - 12 %   Monocytes Absolute 0.4  0.1 - 1.0 K/uL   Eosinophils Relative 1  0 - 5 %   Eosinophils Absolute 0.1  0.0 - 0.7 K/uL   Basophils Relative 0  0 - 1 %   Basophils Absolute 0.0  0.0 - 0.1 K/uL  COMPREHENSIVE METABOLIC PANEL      Result Value Range   Sodium 138  135 - 145 mEq/L   Potassium 4.9  3.5 - 5.1 mEq/L   Chloride 102  96 - 112 mEq/L   CO2 27  19 - 32 mEq/L   Glucose, Bld 190 (*) 70 - 99 mg/dL   BUN 22   6 - 23 mg/dL   Creatinine, Ser 7.82  0.50 - 1.10 mg/dL   Calcium 9.4  8.4 - 95.6 mg/dL   Total Protein 7.0  6.0 - 8.3 g/dL   Albumin 3.6  3.5 - 5.2 g/dL   AST 36  0 - 37 U/L   ALT 32  0 - 35 U/L   Alkaline Phosphatase 59  39 - 117 U/L   Total Bilirubin 0.8  0.3 - 1.2 mg/dL   GFR calc non Af Amer 55 (*) >90 mL/min   GFR calc Af Amer 64 (*) >90 mL/min  APTT      Result Value Range   aPTT 32  24 - 37 seconds  PROTIME-INR      Result Value Range   Prothrombin Time 15.4 (*) 11.6 - 15.2 seconds   INR 1.25  0.00 - 1.49  TROPONIN I      Result Value Range   Troponin I <0.30  <0.30 ng/mL  MAGNESIUM      Result Value Range   Magnesium 1.6  1.5 - 2.5 mg/dL  SAMPLE TO BLOOD BANK      Result Value Range   Blood Bank Specimen SAMPLE AVAILABLE FOR TESTING     Sample Expiration 10/04/2012     Laboratory interpretation all normal except hyperglycemia       Date: 10/03/2012  Rate: 66  Rhythm: atrial fibrillation  QRS Axis: normal  Intervals: PR prolonged  ST/T Wave abnormalities: normal  Conduction Disutrbances:none  Narrative Interpretation: low voltage  Old EKG Reviewed: unchanged   1. Bradycardia   2. Syncopal episodes     Plan admission   Devoria Albe, MD, FACEP   MDM    Ward Givens, MD 10/03/12 [phone]

## 2012-10-03 NOTE — Consult Note (Signed)
Admit date: 10/03/2012 Referring Physician  Dr. Fredderick Phenix   Primary Physician  Dr. Oscar La in [facility] Primary Cardiologist  Dr. Elease Hashimoto Reason for Consultation  Syncope with transient bradycardia  HPI: Patient states this morning about 9 AM when she first sat up when getting out of bed she had a funny feeling in her chest for a few seconds that she's never had before. Other than that she felt fine until later in the day when her daughter came over. She told her daughter she felt dizzy and she told me that was not a spinning sensation but a feeling like she was going to pass out. She went into the kitchen to check her blood sugar nd her daughter heard a banging noise and when she went in the room her mother sitting in a chair and shaking with what appeared to be a tonic clonic seizure. She states it lasted less than a minute. She states the patient was very pale. After the jerking was over she was not responding normally for about 45 seconds and her face got very red. She did not appear to be confused when she woke up and continued to complain of not feeling well.. She also complained of nausea but did not have vomiting. She has never passed out before. She denies headache, chest pain, or shortness of breath. EMS noted she felt clammy and patient complained of feeling hot. She states now she has mild nausea but she denies feeling weak. EMS reports her heart rate was transiently in the 30s.  She has a history of atrial fibrillation on chronic anticoagulation.  Cardiology is now asked to consult for further workup of syncope with transient bradycardia      PMH:   Past Medical History  Diagnosis Date  . Coronary artery disease     STATUS POST OLD INFERIOR WALL M.I. Cath 2008 with 50-60% stenosis LAD, 20% left circ, 99% subtotal occlusion RCA s/p PCI  . Chronic anticoagulation   . Atrial fibrillation   . Hypertension   . Hyperlipidemia   . MI, old 2008  . Shortness of breath   . Diabetes mellitus     TYPE  2  . Arthritis      PSH:   Past Surgical History  Procedure Laterality Date  . Appendectomy    . Total hip arthroplasty      RIGHT HIP  . Abdominal hysterectomy  1976    1976  . Cataract extraction      Bilateral  . Breast biopsy  2013  . Coronary angioplasty with stent placement    . Amputation  01/16/2012    Procedure: AMPUTATION BELOW KNEE;  Surgeon: Valeria Batman, MD;  Location[facility] OR;  Service: Orthopedics;  Laterality: Left;  Left Below Knee Amputation    Allergies:  Bactrim; Janumet; Adhesive; Ciprofloxacin; Percocet; Sulfa antibiotics; and Penicillins Prior to Admit Meds:   (Not in a hospital admission) Fam HX:    Family History  Problem Relation Age of Onset  . Cancer Father 19  . Cancer Mother   . Cancer Daughter   . Diabetes Daughter   . Hypertension Daughter   . Other Daughter     varicose veins   Social HX:    History   Social History  . Marital Status: Widowed    Spouse Name: N/A    Number of Children: N/A  . Years of Education: N/A   Occupational History  . Not on file.   Social History Main Topics  .  Smoking status: Never Smoker   . Smokeless tobacco: Never Used  . Alcohol Use: No  . Drug Use: No  . Sexually Active: Not Currently   Other Topics Concern  . Not on file   Social History Narrative  . No narrative on file     ROS:  All 11 ROS were addressed and are negative except what is stated in the HPI  Physical Exam: Blood pressure 173/93, pulse 70, temperature 97.8 F (36.6 C), temperature source Oral, resp. rate 20, SpO2 97.00%.    General: Well developed, well nourished, in no acute distress Head: Eyes PERRLA, No xanthomas.   Normal cephalic and atramatic  Lungs:   Clear bilaterally to auscultation and percussion. Heart:   HRRR S1 S2 Pulses are 2+ & equal.            No carotid bruit. No JVD.  No abdominal bruits. No femoral bruits. Abdomen: Bowel sounds are positive, abdomen soft and non-tender without masses  Extremities:    No clubbing, cyanosis or edema.  DP +1 Neuro: Alert and oriented X 3. Psych:  Good affect, responds appropriately    Labs:   Lab Results  Component Value Date   WBC 5.9 10/03/2012   HGB 13.6 10/03/2012   HCT 39.4 10/03/2012   MCV 89.7 10/03/2012   PLT 146* 10/03/2012    Recent Labs Lab 10/03/12 1404  NA 138  K 4.9  CL 102  CO2 27  BUN 22  CREATININE 0.96  CALCIUM 9.4  PROT 7.0  BILITOT 0.8  ALKPHOS 59  ALT 32  AST 36  GLUCOSE 190*   No results found for this basename: PTT   Lab Results  Component Value Date   INR 1.25 10/03/2012   INR 1.35 01/20/2012   INR 1.37 01/19/2012   Lab Results  Component Value Date   TROPONINI <0.30 10/03/2012         Radiology:  No results found.  EKG:  pending  ASSESSMENT:  1.  Syncope of ? Etiology - seizure vs. Bradyarrhythmia 2.  Transient bradycardia on beta blockers 3.  Atrial fibrillation on chronic anticoagulation 4.  CAD with remote MI 2008 with subtotalled occlusion RCA s/p PCI 5.  HTN 6.  Dyslipidemia 7.  DM type 2  PLAN:   1.  Stop beta blocker 2.  Check 2D echo eval LVF 3.  Tele monitoring  Quintella Reichert, MD  10/03/2012  5:19 PM

## 2012-10-03 NOTE — ED Notes (Signed)
Pt had nausea when changing positions and HR dropped briefly to 34 bpm with each position change.

## 2012-10-03 NOTE — Progress Notes (Signed)
ANTICOAGULATION CONSULT NOTE - Initial Consult  Pharmacy Consult for Coumadin Indication: atrial fibrillation  Allergies  Allergen Reactions  . Bactrim (Sulfamethoxazole W-Trimethoprim) Hives  . Janumet (Sitagliptin-Metformin Hcl) Nausea Only, Swelling and Other (See Comments)    weakness  . Adhesive (Tape)     WHITE tape: Causes blisters and skin reaction  . Ciprofloxacin Other (See Comments)    unknown  . Percocet (Oxycodone-Acetaminophen) Other (See Comments)    Makes her crazy  . Sulfa Antibiotics Hives    Hyperkalemia with bactrim  . Penicillins Itching and Rash    Childhood allergy:     Labs:  Recent Labs  10/03/12 1404  HGB 13.6  HCT 39.4  PLT 146*  APTT 32  LABPROT 15.4*  INR 1.25  CREATININE 0.96  TROPONINI <0.30    Estimated Creatinine Clearance: 54.1 ml/min (by C-G formula based on Cr of 0.96).   Medical History: Past Medical History  Diagnosis Date  . Coronary artery disease     STATUS POST OLD INFERIOR WALL M.I.  . Chronic anticoagulation   . Atrial fibrillation   . Hypertension   . Hyperlipidemia   . MI, old 2008  . Shortness of breath   . Diabetes mellitus     TYPE 2  . Arthritis    Assessment: 79 year old female on Coumadin PTA for Afib.  Admitted with syncope.  Admit INR = 1.25 on home dose of 2.5 mg MWF, 5 mg other days  Goal of Therapy:  INR 2-3 Monitor platelets by anticoagulation protocol: Yes   Plan:  1) Coumadin 7.5 mg po x 1 dose tonight 2) Daily INR  Thank you. Okey Regal, PharmD [phone]  10/03/2012,8:06 PM

## 2012-10-03 NOTE — ED Notes (Signed)
Attempted to call report, nurse busy with another pt will call back as soon as she is done.

## 2012-10-04 DIAGNOSIS — I359 Nonrheumatic aortic valve disorder, unspecified: Secondary | ICD-10-CM

## 2012-10-04 LAB — BASIC METABOLIC PANEL
BUN: 24 mg/dL — ABNORMAL HIGH (ref 6–23)
Calcium: 9 mg/dL (ref 8.4–10.5)
Creatinine, Ser: 0.95 mg/dL (ref 0.50–1.10)
GFR calc Af Amer: 64 mL/min — ABNORMAL LOW (ref >90)
GFR calc non Af Amer: 55 mL/min — ABNORMAL LOW (ref >90)
Potassium: 4.7 mEq/L (ref 3.5–5.1)

## 2012-10-04 LAB — CBC
Hemoglobin: 12.3 g/dL (ref 12.0–15.0)
MCHC: 34.2 g/dL (ref 30.0–36.0)
Platelets: 141 10*3/uL — ABNORMAL LOW (ref 150–400)
RDW: 13.4 % (ref 11.5–15.5)

## 2012-10-04 LAB — GLUCOSE, CAPILLARY: Glucose-Capillary: 130 mg/dL — ABNORMAL HIGH (ref 70–99)

## 2012-10-04 LAB — TROPONIN I
Troponin I: <0.3 ng/mL (ref <0.30)
Troponin I: <0.3 ng/mL (ref <0.30)

## 2012-10-04 LAB — PROTIME-INR
INR: 1.3 (ref 0.00–1.49)
Prothrombin Time: 15.9 seconds — ABNORMAL HIGH (ref 11.6–15.2)

## 2012-10-04 MED ORDER — MAGNESIUM OXIDE 400 MG PO TABS
400.0000 mg | ORAL_TABLET | Freq: Two times a day (BID) | ORAL | Status: DC
  Administered 2012-10-04 – 2012-10-06 (×5): 400 mg via ORAL
  Filled 2012-10-04 (×6): qty 1

## 2012-10-04 MED ORDER — SODIUM CHLORIDE 0.9 % IV SOLN
INTRAVENOUS | Status: DC
  Administered 2012-10-04 – 2012-10-06 (×3): via INTRAVENOUS

## 2012-10-04 MED ORDER — VITAMIN D3 10 MCG (400 UNIT) PO CAPS: 1.0000 | ORAL_CAPSULE | Freq: Every day | ORAL | Status: DC

## 2012-10-04 MED ORDER — WARFARIN SODIUM 5 MG PO TABS
5.0000 mg | ORAL_TABLET | Freq: Once | ORAL | Status: AC
  Administered 2012-10-04: 5 mg via ORAL
  Filled 2012-10-04: qty 1

## 2012-10-04 MED ORDER — CHOLECALCIFEROL 10 MCG (400 UNIT) PO TABS
400.0000 [IU] | ORAL_TABLET | Freq: Every day | ORAL | Status: DC
  Administered 2012-10-04 – 2012-10-06 (×3): 400 [IU] via ORAL
  Filled 2012-10-04 (×3): qty 1

## 2012-10-04 NOTE — Progress Notes (Signed)
Patient ID: Kathryn Schroeder, female   DOB: 1930/05/16, 79 y.o.   MRN: [id number] Bradycardia improved off beta blocker. Awaiting echo. Cardiac markers are negative. We'll reevaluate in a.m. once echo available.

## 2012-10-04 NOTE — Progress Notes (Addendum)
ANTICOAGULATION CONSULT NOTE - Follow Up Consult  Pharmacy Consult for Coumadin Indication: atrial fibrillation  Allergies  Allergen Reactions  . Bactrim (Sulfamethoxazole W-Trimethoprim) Hives  . Janumet (Sitagliptin-Metformin Hcl) Nausea Only, Swelling and Other (See Comments)    weakness  . Adhesive (Tape)     WHITE tape: Causes blisters and skin reaction  . Ciprofloxacin Other (See Comments)    unknown  . Percocet (Oxycodone-Acetaminophen) Other (See Comments)    Makes her crazy  . Sulfa Antibiotics Hives    Hyperkalemia with bactrim  . Penicillins Itching and Rash    Childhood allergy:    Labs:  Recent Labs  10/03/12 1404 10/03/12 2243 10/04/12 0200  HGB 13.6  --  12.3  HCT 39.4  --  36.0  PLT 146*  --  141*  APTT 32  --   --   LABPROT 15.4*  --  15.9*  INR 1.25  --  1.30  CREATININE 0.96  --  0.95  TROPONINI <0.30 <0.30 <0.30   Estimated Creatinine Clearance: 54.7 ml/min (by C-G formula based on Cr of 0.95).  Medical History: Past Medical History  Diagnosis Date  . Coronary artery disease     STATUS POST OLD INFERIOR WALL M.I.  . Chronic anticoagulation   . Atrial fibrillation   . Hypertension   . Hyperlipidemia   . MI, old 2008  . Shortness of breath   . Diabetes mellitus     TYPE 2  . Arthritis    Assessment: 79 year old female on Coumadin PTA for Afib.  Admitted with syncope.  Admit INR = 1.25, Spoke with patient and she confirms this dose.  She states she has not missed any doses.  She received 7.5mg  dose yesterday and she did not change much but this may be due to the timing since dose was not given until almost 9PM.  She is without any noted bleeding complications and her labs are stable with some mild thrombocytopenia.  HOME dose: Warfarin 2.5 mg MWF, 5 mg other days  Goal of Therapy:  INR 2-3 Monitor platelets by anticoagulation protocol: Yes   Plan:  1) Will give Coumadin 5 mg po x 1 dose tonight 2) Daily INR  Nadara Mustard, PharmD.,  MS Clinical Pharmacist Pager:  [phone] Thank you for allowing pharmacy to be part of this patients care team. 10/04/2012,8:10 AM

## 2012-10-04 NOTE — Progress Notes (Signed)
[facility] Progress Note [place] TEAM 1 - Stepdown/ICU TEAM   Kathryn Schroeder:[id number] DOB: Aug 18, 1930 DOA: 10/03/2012 PCP: Romeo Rabon, MD  Brief narrative: 79 y.o. female who presented following a syncopal episode. She stated that when she first sat up in bed the morning of her admit she had a funny feeling in her chest which lasted a few seconds. Subsequently she reported that she sat down in the living room and began feeling dizzy and this worsened and so she told her daughter, who asked her to check her sugar. She stated she went to the kitchen table and that her daughter states that she then heard a banging noise.  When she turned around her mother was shaking/jerking for a few minutes and then she slumped back in her wheelchair. Her daughter stated that during the episode she was unresponsive, but she kept trying to arouse her and then she came to it, and was able to answer questions. She stated patient was not confused and there was no incontinence or tongue biting. She admitted to feeling nauseous but no vomiting. EMS reported that when they arrived patient was clammy and she complained of feeling hot all over and that her heart rate was in the 30s . Per EDP when they tried to stand patient up she also became bradycardic to the 30s and complained of feeling dizzy.   Assessment/Plan:  Syncope Likely due to bradycardia - f/u echo results - follow on tele - begin PT/OT  Bradycardia Possibly simply secondary to beta blocker use - beta blocker on hold - Cardiology following  A-fib on chronic coumadin Continue Coumadin per pharmacy - rate bradycardic at present   Coronary artery disease s/p PCI in the past followed by Dr Elease Hashimoto (Cath 2008 with 50-60% stenosis LAD, 20% left circ, 99% subtotal occlusion RCA s/p PCI) - troponin negative x3 - denies cp  HTN  Was mildly hypotensive at presentation but blood pressure is currently elevated - monitor w/o change at this time  in setting of bradycardia  Diabetes 2 Reasonably well controlled at present time - continue to follow  HLD Does not appear to be on medical treatment as an outpatient  Code Status: FULL Family Communication: no family present at time of exam  Disposition Plan: SDU  Consultants: [facility]  Procedures: TTE - 6/28 - pending  Antibiotics: none  DVT prophylaxis: warfarin  HPI/Subjective: The patient is alert and oriented and quite pleasant.  She denies chest pain shortness of breath lightheadedness or dizziness.  She denies abdominal pain.  She does report that she has lost approximately 50 pounds over the last year though she has not intentionally done so.  Objective: Blood pressure 164/72, pulse 59, temperature 98.4 F (36.9 C), temperature source Oral, resp. rate 22, height 5\' 8"  (1.727 m), weight 84.5 kg (186 lb 4.6 oz), SpO2 97.00%.  Intake/Output Summary (Last 24 hours) at 10/04/12 0940 Last data filed at 10/04/12 0900  Gross per 24 hour  Intake    320 ml  Output   1100 ml  Net   -780 ml   Exam: General: No acute respiratory distress at rest Lungs: Clear to auscultation bilaterally without wheezes or crackles Cardiovascular: Bradycardic at approximately 50 beats per minute without appreciable murmur or rub Abdomen: Nontender, nondistended, soft, bowel sounds positive, no rebound, no ascites, no appreciable mass Extremities: No significant cyanosis, clubbing, or edema R LE  Data Reviewed: Basic Metabolic Panel:  Recent Labs Lab 10/03/12 1404 10/04/12 0200  NA  138 135  K 4.9 4.7  CL 102 103  CO2 27 25  GLUCOSE 190* 205*  BUN 22 24*  CREATININE 0.96 0.95  CALCIUM 9.4 9.0  MG 1.6  --    Liver Function Tests:  Recent Labs Lab 10/03/12 1404  AST 36  ALT 32  ALKPHOS 59  BILITOT 0.8  PROT 7.0  ALBUMIN 3.6   CBC:  Recent Labs Lab 10/03/12 1404 10/04/12 0200  WBC 5.9 6.1  NEUTROABS 4.4  --   HGB 13.6 12.3  HCT 39.4 36.0  MCV 89.7  90.0  PLT 146* 141*   Cardiac Enzymes:  Recent Labs Lab 10/03/12 1404 10/03/12 2243 10/04/12 0200 10/04/12 0730  TROPONINI <0.30 <0.30 <0.30 <0.30   BNP (last 3 results)  Recent Labs  10/22/11 0341 01/14/12 1938  PROBNP 5797.0* 3478.0*   CBG:  Recent Labs Lab 10/03/12 1345 10/03/12 1831 10/04/12 0807  GLUCAP 186* 166* 127*    Recent Results (from the past 240 hour(s))  MRSA PCR SCREENING     Status: None   Collection Time    10/04/12  4:21 AM      Result Value Range Status   MRSA by PCR NEGATIVE  NEGATIVE Final   Comment:            The GeneXpert MRSA Assay (FDA     approved for NASAL specimens     only), is one component of a     comprehensive MRSA colonization     surveillance program. It is not     intended to diagnose MRSA     infection nor to guide or     monitor treatment for     MRSA infections.     Studies:  Recent x-ray studies have been reviewed in detail by the Attending Physician  Scheduled Meds:  Scheduled Meds: . aspirin  81 mg Oral Daily  . brimonidine  1 drop Both Eyes BID   And  . timolol  1 drop Both Eyes BID  . insulin aspart  0-9 Units Subcutaneous TID WC  . losartan  25 mg Oral Daily  . metFORMIN  500 mg Oral Q supper  . omega-3 acid ethyl esters  1 g Oral Daily  . sodium chloride  3 mL Intravenous Q12H  . sodium chloride  3 mL Intravenous Q12H  . warfarin  5 mg Oral ONCE-1800  . Warfarin - Pharmacist Dosing Inpatient   Does not apply q1800    Time spent on care of this patient:   [facility] T  [facility] Office  [phone] Pager - Text Page per Loretha Stapler as per below:  On-Call/Text Page:      [email or web address]      password [email or web address]  If 7PM-7AM, please contact night-coverage [email or web address] Password [facility] 10/04/2012, 9:40 AM   LOS: 1 day

## 2012-10-04 NOTE — Progress Notes (Signed)
Echo Lab  2D Echocardiogram completed.  Stephen Baruch L Jermiyah Ricotta, RDCS 10/04/2012 3:11 PM

## 2012-10-05 LAB — GLUCOSE, CAPILLARY
Glucose-Capillary: 94 mg/dL (ref 70–99)
Glucose-Capillary: 150 mg/dL — ABNORMAL HIGH (ref 70–99)
Glucose-Capillary: 114 mg/dL — ABNORMAL HIGH (ref 70–99)

## 2012-10-05 LAB — BASIC METABOLIC PANEL
BUN: 26 mg/dL — ABNORMAL HIGH (ref 6–23)
GFR calc non Af Amer: 55 mL/min — ABNORMAL LOW (ref >90)
Glucose, Bld: 108 mg/dL — ABNORMAL HIGH (ref 70–99)
Potassium: 4.4 mEq/L (ref 3.5–5.1)

## 2012-10-05 LAB — MAGNESIUM: Magnesium: 1.6 mg/dL (ref 1.5–2.5)

## 2012-10-05 LAB — CBC
Hemoglobin: 12.8 g/dL (ref 12.0–15.0)
MCH: 30.5 pg (ref 26.0–34.0)
MCHC: 33.8 g/dL (ref 30.0–36.0)

## 2012-10-05 LAB — OCCULT BLOOD, POC DEVICE: Fecal Occult Bld: POSITIVE — AB

## 2012-10-05 MED ORDER — WARFARIN SODIUM 5 MG PO TABS
5.0000 mg | ORAL_TABLET | Freq: Once | ORAL | Status: AC
  Administered 2012-10-05: 5 mg via ORAL
  Filled 2012-10-05 (×2): qty 1

## 2012-10-05 NOTE — Care Management Note (Signed)
    Page 1 of 1   10/05/2012     12:40:02 PM   CARE MANAGEMENT NOTE 10/05/2012  Patient:  Kathryn Schroeder, Kathryn Schroeder   Account Number:  [id number]  Date Initiated:  10/05/2012  Documentation initiated by:  Junius Creamer  Subjective/Objective Assessment:   adm w syncope and brady     Action/Plan:   lives alone, pcp dr Lavell Luster   Anticipated DC Date:     Anticipated DC Plan:        DC Planning Services  CM consult      Choice offered to / List presented to:             Status of service:   Medicare Important Message given?   (If response is "NO", the following Medicare IM given date fields will be blank) Date Medicare IM given:   Date Additional Medicare IM given:    Discharge Disposition:    Per UR Regulation:  Reviewed for med. necessity/level of care/duration of stay  If discussed at Long Length of Stay Meetings, dates discussed:    Comments:

## 2012-10-05 NOTE — Progress Notes (Signed)
PROGRESS NOTE  Subjective:   Pt was admitted for syncope.    BB has been held. Echo: Left ventricle: The cavity size was normal. Wall thickness was normal with sigmoid shaped septum. Systolic function was normal. The estimated ejection fraction was in the range of 60% to 65%. Wall motion was normal; there were no regional wall motion abnormalities. Features are consistent with a pseudonormal left ventricular filling pattern, with concomitant abnormal relaxation and increased filling pressure (grade 2 diastolic dysfunction). - Aortic valve: Moderately calcified annulus. Trileaflet; moderately thickened, moderately calcified leaflets. Cusp separation was moderately reduced. There was moderate stenosis. Trivial regurgitation. Mean gradient: 11mm Hg (S). Valve area: 1.05cm^2(VTI). Planimetry area 1.14 cm2. Peak velocity ratio of LVOT to aortic valve: 0.44. - Mitral valve: Heavily calcified annulus. Moderately calcified leaflets Trivial regurgitation. - Left atrium: The atrium was moderately dilated. - Tricuspid valve: Mild regurgitation. Peak RV-RA gradient: 23mm Hg (S). - Inferior vena cava: Not visualized. Unable to estimate CVP. - Pericardium, extracardiac: There was no pericardial effusion. Impressions:  - Compared to prior study July 2013, no major change in LVEF, overall 60-65%. Moderate diastolic dysfunction. Significant MAC with trivial mitral reguyrgitation and moderate left atrial enlargement. Aortic stenosis is moderate overall, no change in gradient. Mild tricuspid regurgitation. Although PASP not calculated, RV-RA gradient has decreased since last assessment.  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    Objective:    Vital Signs:   Temp:  [97.8 F (36.6 C)-98.8 F (37.1 C)] 97.8 F (36.6 C) (06/30 0323) Pulse Rate:  [55-64] 62 (06/30 0323) Resp:  [17-22] 20 (06/30 0400) BP: (132-164)/(51-87) 150/71 mmHg (06/30 0323) SpO2:  [96 %-98 %] 97 %  (06/30 0323)  Last BM Date: 10/02/12   24-hour weight change: Weight change:   Weight trends: Filed Weights   10/03/12 1858  Weight: 186 lb 4.6 oz (84.5 kg)    Intake/Output:  06/29 0701 - 06/30 0700 In: 1630 [P.O.:780; I.V.:850] Out: 2075 [Urine:2075]     Physical Exam: BP 150/71  Pulse 62  Temp(Src) 97.8 F (36.6 C) (Oral)  Resp 20  Ht 5\' 8"  (1.727 m)  Wt 186 lb 4.6 oz (84.5 kg)  BMI 28.33 kg/m2  SpO2 97%  General: Vital signs reviewed and noted.   Head: Normocephalic, atraumatic.  Eyes: conjunctivae/corneas clear.  EOM's intact.   Throat: normal  Neck:  normal  Lungs:    clear  Heart:  Irreg. Irreg, soft systolic murmur  Abdomen:  Soft, non-tender, non-distended    Extremities: S/p left BKA, no edema on right   Neurologic: A&O X3, CN II - XII are grossly intact.   Psych: Normal     Labs: BMET:  Recent Labs  10/03/12 1404 10/04/12 0200 10/05/12 0515  NA 138 135 138  K 4.9 4.7 4.4  CL 102 103 105  CO2 27 25 27   GLUCOSE 190* 205* 108*  BUN 22 24* 26*  CREATININE 0.96 0.95 0.96  CALCIUM 9.4 9.0 9.1  MG 1.6  --  1.6    Liver function tests:  Recent Labs  10/03/12 1404  AST 36  ALT 32  ALKPHOS 59  BILITOT 0.8  PROT 7.0  ALBUMIN 3.6   No results found for this basename: LIPASE, AMYLASE,  in the last 72 hours  CBC:  Recent Labs  10/03/12 1404 10/04/12 0200 10/05/12 0515  WBC 5.9 6.1 4.9  NEUTROABS 4.4  --   --   HGB 13.6 12.3 12.8  HCT 39.4 36.0 37.9  MCV 89.7 90.0 90.2  PLT 146* 141* 138*    Cardiac Enzymes:  Recent Labs  10/03/12 1404 10/03/12 2243 10/04/12 0200 10/04/12 0730  TROPONINI <0.30 <0.30 <0.30 <0.30    Coagulation Studies:  Recent Labs  10/03/12 1404 10/04/12 0200 10/05/12 0515  LABPROT 15.4* 15.9* 17.3*  INR 1.25 1.30 1.45    Other: No components found with this basename: POCBNP,  No results found for this basename: DDIMER,  in the last 72 hours No results found for this basename: HGBA1C,  in  the last 72 hours No results found for this basename: CHOL, HDL, LDLCALC, TRIG, CHOLHDL,  in the last 72 hours  Recent Labs  10/03/12 2243  TSH 0.011*   No results found for this basename: VITAMINB12, FOLATE, FERRITIN, TIBC, IRON, RETICCTPCT,  in the last 72 hours   Other results: Tele:  A-Fib with slow ventricular response  Medications:    Infusions: . sodium chloride 50 mL/hr at 10/04/12 1300    Scheduled Medications: . aspirin  81 mg Oral Daily  . brimonidine  1 drop Both Eyes BID   And  . timolol  1 drop Both Eyes BID  . cholecalciferol  400 Units Oral Daily  . insulin aspart  0-9 Units Subcutaneous TID WC  . losartan  25 mg Oral Daily  . magnesium oxide  400 mg Oral BID  . metFORMIN  500 mg Oral Q supper  . omega-3 acid ethyl esters  1 g Oral Daily  . Warfarin - Pharmacist Dosing Inpatient   Does not apply q1800    Assessment/ Plan:       Syncope:  Her HR has improved since stopping the beta blocker.  Echo is basically unremarkable. I think she could probably go to tele today.    A-Fib- rate is still slow.    Continue current meds    Disposition:  Length of Stay: 2  Vesta Mixer, Montez Hageman., MD, [facility] 10/05/2012, 7:52 AM Office [phone] Pager [phone]

## 2012-10-05 NOTE — Progress Notes (Signed)
ANTICOAGULATION CONSULT NOTE - Follow Up Consult  Pharmacy Consult for Coumadin Indication: atrial fibrillation  Allergies  Allergen Reactions  . Bactrim (Sulfamethoxazole W-Trimethoprim) Hives  . Janumet (Sitagliptin-Metformin Hcl) Nausea Only, Swelling and Other (See Comments)    weakness  . Adhesive (Tape)     WHITE tape: Causes blisters and skin reaction  . Ciprofloxacin Other (See Comments)    unknown  . Percocet (Oxycodone-Acetaminophen) Other (See Comments)    Makes her crazy  . Sulfa Antibiotics Hives    Hyperkalemia with bactrim  . Penicillins Itching and Rash    Childhood allergy:    Labs:  Recent Labs  10/03/12 1404 10/03/12 2243 10/04/12 0200 10/04/12 0730 10/05/12 0515  HGB 13.6  --  12.3  --  12.8  HCT 39.4  --  36.0  --  37.9  PLT 146*  --  141*  --  138*  APTT 32  --   --   --   --   LABPROT 15.4*  --  15.9*  --  17.3*  INR 1.25  --  1.30  --  1.45  CREATININE 0.96  --  0.95  --  0.96  TROPONINI <0.30 <0.30 <0.30 <0.30  --    Estimated Creatinine Clearance: 54.1 ml/min (by C-G formula based on Cr of 0.96).  Medical History: Past Medical History  Diagnosis Date  . Coronary artery disease     STATUS POST OLD INFERIOR WALL M.I.  . Chronic anticoagulation   . Atrial fibrillation   . Hypertension   . Hyperlipidemia   . MI, old 2008  . Shortness of breath   . Diabetes mellitus     TYPE 2  . Arthritis    Assessment: 79 year old female on Coumadin PTA for Afib.  Admitted with syncope.  Admit INR = 1.25, Spoke with patient and she confirms this dose.  She states she has not missed any doses.  She received 7.5mg  dose 6/28 and she did not change much but this may be due to the timing since dose was not given until almost 9PM.  She is without any noted bleeding complications and her labs are stable with some mild thrombocytopenia. Couamidn 5mg  given 6/29 INR 1.45 slowly trending up.   HOME dose: Warfarin 2.5 mg MWF, 5 mg other days  Goal of Therapy:   INR 2-3 Monitor platelets by anticoagulation protocol: Yes   Plan:  1) Will give Coumadin 5 mg po x 1 dose tonight 2) Daily INR   Leota Sauers Pharm.D. CPP, BCPS Clinical Pharmacist [phone] 10/05/2012 1:55 PM

## 2012-10-05 NOTE — Progress Notes (Addendum)
[facility] Progress Note [place] TEAM 1 - Stepdown/ICU TEAM    Kathryn Schroeder ZOX:[id number] DOB: Mar 20, 1931 DOA: 10/03/2012 PCP: Romeo Rabon, MD  Brief narrative: 79 y.o. female who presented following a syncopal episode. She stated that when she first sat up in bed the morning of her admit she had a funny feeling in her chest which lasted a few seconds. Subsequently she reported that she sat down in the living room and began feeling dizzy and this worsened and so she told her daughter, who asked her to check her sugar. She stated she went to the kitchen table and that her daughter states that she then heard a banging noise.  When she turned around her mother was shaking/jerking for a few minutes and then she slumped back in her wheelchair. Her daughter stated that during the episode she was unresponsive, but she kept trying to arouse her and then she came to it, and was able to answer questions. She stated patient was not confused and there was no incontinence or tongue biting. She admitted to feeling nauseous but no vomiting. EMS reported that when they arrived patient was clammy and she complained of feeling hot all over and that her heart rate was in the 30s . Per EDP when they tried to stand patient up she also became bradycardic to the 30s and complained of feeling dizzy.   Assessment/Plan:  Syncope Likely due to bradycardia - echo w/o acute findings - follow on tele - begin PT/OT   Bradycardia Possibly simply secondary to beta blocker use - beta blocker on hold - Cardiology following  A-fib on chronic coumadin Continue Coumadin per pharmacy - rate bradycardic at present - coumadin not at goal - pharmacy dosing   Coronary artery disease s/p PCI in the past followed by Dr Elease Hashimoto (Cath 2008 with 50-60% stenosis LAD, 20% left circ, 99% subtotal occlusion RCA s/p PCI) - troponin negative x3 - denies cp  HTN  Was mildly hypotensive at presentation but blood pressure is  currently elevated - monitor w/o change at this time in setting of bradycardia  Diabetes 2 Reasonably well controlled at present time - continue to follow  HLD Does not appear to be on medical treatment as an outpatient  Unintentional weight loss - guaiac + and low TSH No clear sx to suggest occult CA on interview - stool guiac positive, but no frank GIB - will need colonoscopy for colon CA screening as soon as stable from cardiac standpoint - check free T4 to investigate low TSH  Code Status: FULL Family Communication: no family present at time of exam  Disposition Plan: transfer to tele bed  Consultants: [facility]  Procedures: TTE - 6/28 - EF 60-65% - no WMA - grade 2 DD - moderate AoS  Antibiotics: none  DVT prophylaxis: warfarin  HPI/Subjective: The patient denies chest pain shortness of breath lightheadedness or dizziness.  She denies abdominal pain.  She does report that she has lost approximately 50 pounds over the last year though she has not intentionally done so.  Objective: Blood pressure 149/67, pulse 52, temperature 98.7 F (37.1 C), temperature source Oral, resp. rate 20, height 5\' 8"  (1.727 m), weight 84.5 kg (186 lb 4.6 oz), SpO2 95.00%.  Intake/Output Summary (Last 24 hours) at 10/05/12 1353 Last data filed at 10/05/12 0800  Gross per 24 hour  Intake   1330 ml  Output   1475 ml  Net   -145 ml   Exam: General: No acute  respiratory distress at rest Lungs: Clear to auscultation bilaterally without wheezes or crackles Cardiovascular: Bradycardic at approximately 50 beats per minute without appreciable murmur or rub Abdomen: Nontender, nondistended, soft, bowel sounds positive, no rebound, no ascites, no appreciable mass Extremities: No significant cyanosis, clubbing, or edema R LE - prosthesis on L LE  Data Reviewed: Basic Metabolic Panel:  Recent Labs Lab 10/03/12 1404 10/04/12 0200 10/05/12 0515  NA 138 135 138  K 4.9 4.7 4.4  CL 102  103 105  CO2 27 25 27   GLUCOSE 190* 205* 108*  BUN 22 24* 26*  CREATININE 0.96 0.95 0.96  CALCIUM 9.4 9.0 9.1  MG 1.6  --  1.6   Liver Function Tests:  Recent Labs Lab 10/03/12 1404  AST 36  ALT 32  ALKPHOS 59  BILITOT 0.8  PROT 7.0  ALBUMIN 3.6   CBC:  Recent Labs Lab 10/03/12 1404 10/04/12 0200 10/05/12 0515  WBC 5.9 6.1 4.9  NEUTROABS 4.4  --   --   HGB 13.6 12.3 12.8  HCT 39.4 36.0 37.9  MCV 89.7 90.0 90.2  PLT 146* 141* 138*   Cardiac Enzymes:  Recent Labs Lab 10/03/12 1404 10/03/12 2243 10/04/12 0200 10/04/12 0730  TROPONINI <0.30 <0.30 <0.30 <0.30   BNP (last 3 results)  Recent Labs  10/22/11 0341 01/14/12 1938  PROBNP 5797.0* 3478.0*   CBG:  Recent Labs Lab 10/04/12 1234 10/04/12 1703 10/04/12 2128 10/05/12 0800 10/05/12 1116  GLUCAP 130* 110* 205* 94 150*    Recent Results (from the past 240 hour(s))  MRSA PCR SCREENING     Status: None   Collection Time    10/04/12  4:21 AM      Result Value Range Status   MRSA by PCR NEGATIVE  NEGATIVE Final   Comment:            The GeneXpert MRSA Assay (FDA     approved for NASAL specimens     only), is one component of a     comprehensive MRSA colonization     surveillance program. It is not     intended to diagnose MRSA     infection nor to guide or     monitor treatment for     MRSA infections.     Studies:  Recent x-ray studies have been reviewed in detail by the Attending Physician  Scheduled Meds:  Scheduled Meds: . aspirin  81 mg Oral Daily  . brimonidine  1 drop Both Eyes BID   And  . timolol  1 drop Both Eyes BID  . cholecalciferol  400 Units Oral Daily  . insulin aspart  0-9 Units Subcutaneous TID WC  . losartan  25 mg Oral Daily  . magnesium oxide  400 mg Oral BID  . metFORMIN  500 mg Oral Q supper  . omega-3 acid ethyl esters  1 g Oral Daily  . warfarin  5 mg Oral ONCE-1800  . Warfarin - Pharmacist Dosing Inpatient   Does not apply q1800    Time spent on  care of this patient:   [facility] T  [facility] Office  [phone] Pager - Text Page per Loretha Stapler as per below:  On-Call/Text Page:      [email or web address]      password [email or web address]  If 7PM-7AM, please contact night-coverage [email or web address] Password [email or web address] 10/05/2012, 1:53 PM   LOS: 2 days

## 2012-10-05 NOTE — Progress Notes (Signed)
Received patient from 2900.  Patient alert and oriented x4.  Oriented to room and unit; telemetry applied.  Vitals stable; denies pain.  Will continue to monitor.

## 2012-10-06 LAB — CBC
MCH: 30.8 pg (ref 26.0–34.0)
MCHC: 34.1 g/dL (ref 30.0–36.0)
MCV: 90.3 fL (ref 78.0–100.0)
Platelets: 127 10*3/uL — ABNORMAL LOW (ref 150–400)
RDW: 13.4 % (ref 11.5–15.5)
WBC: 4.7 10*3/uL (ref 4.0–10.5)

## 2012-10-06 LAB — BASIC METABOLIC PANEL
Calcium: 8.8 mg/dL (ref 8.4–10.5)
Creatinine, Ser: 0.95 mg/dL (ref 0.50–1.10)
GFR calc Af Amer: 64 mL/min — ABNORMAL LOW (ref >90)
GFR calc non Af Amer: 55 mL/min — ABNORMAL LOW (ref >90)

## 2012-10-06 LAB — T4, FREE: Free T4: 1.37 ng/dL (ref 0.80–1.80)

## 2012-10-06 NOTE — Evaluation (Signed)
Occupational Therapy Evaluation Patient Details Name: Kathryn Schroeder MRN: [id number] DOB: 1931/04/06 Today's Date: 10/06/2012 Time: 7829-5621 OT Time Calculation (min): 42 min  OT Assessment / Plan / Recommendation History of present illness 79 y.o. female who presented following a syncopal episode likely due to bradycardia.  Pt alos with hisotry of LBKA.   Clinical Impression   Pt overall functions at a supervision to modified independent level for selfcare tasks and functional transfers.  Uses her prosthesis and RW at all times.  Family helps out with homemanagement tasks and checks in on her daily.  Feel she is at her current functional baseline.  No further OT needs or follow-up therapy.    OT Assessment  Patient needs continued OT Services;Patient does not need any further OT services    Follow Up Recommendations  No OT follow up;Supervision - Intermittent       Equipment Recommendations  None recommended by OT          Precautions / Restrictions Precautions Precautions: None Required Braces or Orthoses: Other Brace/Splint Other Brace/Splint: left lower extremity prosthesis Restrictions Weight Bearing Restrictions: No   Pertinent Vitals/Pain No report of pain    ADL  Eating/Feeding: Performed;Independent Where Assessed - Eating/Feeding: Edge of bed Grooming: Performed;Supervision/safety Where Assessed - Grooming: Unsupported standing Upper Body Bathing: Simulated;Set up Where Assessed - Upper Body Bathing: Unsupported sitting Lower Body Bathing: Simulated;Supervision/safety Where Assessed - Lower Body Bathing: Unsupported sit to stand Upper Body Dressing: Simulated;Set up Where Assessed - Upper Body Dressing: Unsupported sitting Lower Body Dressing: Performed;Supervision/safety Where Assessed - Lower Body Dressing: Supported sit to stand Toilet Transfer: Research scientist (life sciences) Method: Other (comment) (ambulate with RW to the 3:1 over the  toilet) Acupuncturist: Bedside commode Toileting - Clothing Manipulation and Hygiene: Performed;Supervision/safety Where Assessed - Toileting Clothing Manipulation and Hygiene: Sit to stand from 3-in-1 or toilet Tub/Shower Transfer Method: Not assessed Equipment Used: Rolling walker Transfers/Ambulation Related to ADLs: Pt supervision level for mobility with use of prosthesis and RW. ADL Comments: Pt overall at functional baseline of modified independent to supervision level.  Has her house setup to fit her current situation with her son and daughter checking on her daily and providing assistance as needed.  No further OT needs.      Visit Information  Last OT Received On: 10/06/12 Assistance Needed: +1 History of Present Illness: 79 y.o. female who presented following a syncopal episode likely due to bradycardia.  Pt alos with hisotry of LBKA.       Prior Functioning     Home Living Family/patient expects to be discharged to:: Private residence Living Arrangements: Alone Available Help at Discharge: Available PRN/intermittently;Family Type of Home: House Home Access: Stairs to enter Secretary/administrator of Steps: 2 Entrance Stairs-Rails: Right Home Layout: One level Home Equipment: Bedside commode;Grab bars - tub/shower;Walker - 2 wheels Additional Comments: reacher, lift chair Prior Function Level of Independence: Independent with assistive device(s) Communication Communication: No difficulties Dominant Hand: Right         Vision/Perception Vision - History Baseline Vision: Wears glasses only for reading Patient Visual Report: No change from baseline Vision - Assessment Eye Alignment: Within Functional Limits Vision Assessment: Vision not tested Perception Perception: Within Functional Limits Praxis Praxis: Intact   Cognition  Cognition Arousal/Alertness: Awake/alert Behavior During Therapy: WFL for tasks assessed/performed Overall Cognitive  Status: Within Functional Limits for tasks assessed    Extremity/Trunk Assessment Upper Extremity Assessment Upper Extremity Assessment: Overall WFL for tasks assessed Lower Extremity Assessment  Lower Extremity Assessment: Defer to PT evaluation     Mobility Transfers Transfers: Sit to Stand;Stand to Sit Sit to Stand: 5: Supervision;From chair/3-in-1;With upper extremity assist Stand to Sit: 5: Supervision;With upper extremity assist;To chair/3-in-1 Details for Transfer Assistance: Pt needing increased time to position the LLE for sit to stand.        Balance Balance Balance Assessed: Yes Static Standing Balance Static Standing - Balance Support: Right upper extremity supported;Left upper extremity supported Static Standing - Level of Assistance: 5: Stand by assistance   End of Session OT - End of Session Equipment Utilized During Treatment: Rolling walker;Other (comment) (LLE prosthesis) Activity Tolerance: Patient tolerated treatment well Patient left: in chair;with call bell/phone within reach Nurse Communication: Mobility status     Jeffery Bachmeier OTR/L Pager number [phone] 10/06/2012, 9:04 AM

## 2012-10-06 NOTE — Progress Notes (Signed)
PROGRESS NOTE  Subjective:   Pt was admitted for syncope.   She has been stable for the past 2 days.  BB has been held. Echo: Left ventricle: The cavity size was normal. Wall thickness was normal with sigmoid shaped septum. Systolic function was normal. The estimated ejection fraction was in the range of 60% to 65%. Wall motion was normal; there were no regional wall motion abnormalities. Features are consistent with a pseudonormal left ventricular filling pattern, with concomitant abnormal relaxation and increased filling pressure (grade 2 diastolic dysfunction). - Aortic valve: Moderately calcified annulus. Trileaflet; moderately thickened, moderately calcified leaflets. Cusp separation was moderately reduced. There was moderate stenosis. Trivial regurgitation. Mean gradient: 11mm Hg (S). Valve area: 1.05cm^2(VTI). Planimetry area 1.14 cm2. Peak velocity ratio of LVOT to aortic valve: 0.44. - Mitral valve: Heavily calcified annulus. Moderately calcified leaflets Trivial regurgitation. - Left atrium: The atrium was moderately dilated. - Tricuspid valve: Mild regurgitation. Peak RV-RA gradient: 23mm Hg (S). - Inferior vena cava: Not visualized. Unable to estimate CVP. - Pericardium, extracardiac: There was no pericardial effusion. Impressions:  - Compared to prior study July 2013, no major change in LVEF, overall 60-65%. Moderate diastolic dysfunction. Significant MAC with trivial mitral reguyrgitation and moderate left atrial enlargement. Aortic stenosis is moderate overall, no change in gradient. Mild tricuspid regurgitation. Although PASP not calculated, RV-RA gradient has decreased since last assessment.   Objective:    Vital Signs:   Temp:  [98 F (36.7 C)-98.7 F (37.1 C)] 98.2 F (36.8 C) (07/01 0448) Pulse Rate:  [52-58] 58 (07/01 0448) Resp:  [20] 20 (07/01 0448) BP: (149-164)/(67-89) 164/89 mmHg (07/01 0448) SpO2:  [95 %-98 %] 95 % (07/01  0448)  Last BM Date: 10/03/12   24-hour weight change: Weight change:   Weight trends: Filed Weights   10/03/12 1858  Weight: 186 lb 4.6 oz (84.5 kg)    Intake/Output:  06/30 0701 - 07/01 0700 In: 950 [P.O.:550; I.V.:400] Out: 250 [Urine:250]     Physical Exam: BP 164/89  Pulse 58  Temp(Src) 98.2 F (36.8 C) (Oral)  Resp 20  Ht 5\' 8"  (1.727 m)  Wt 186 lb 4.6 oz (84.5 kg)  BMI 28.33 kg/m2  SpO2 95%  General: Vital signs reviewed and noted.   Head: Normocephalic, atraumatic.  Eyes: conjunctivae/corneas clear.  EOM's intact.   Throat: normal  Neck:  normal  Lungs:  clear  Heart:  Irreg. Irreg, soft systolic murmur  Abdomen:  Soft, non-tender, non-distended    Extremities: S/p left BKA, no edema on right   Neurologic: A&O X3, CN II - XII are grossly intact.   Psych: Normal     Labs: BMET:  Recent Labs  10/03/12 1404  10/05/12 0515 10/06/12 0429  NA 138  < > 138 137  K 4.9  < > 4.4 4.3  CL 102  < > 105 105  CO2 27  < > 27 26  GLUCOSE 190*  < > 108* 102*  BUN 22  < > 26* 25*  CREATININE 0.96  < > 0.96 0.95  CALCIUM 9.4  < > 9.1 8.8  MG 1.6  --  1.6  --   < > = values in this interval not displayed.  Liver function tests:  Recent Labs  10/03/12 1404  AST 36  ALT 32  ALKPHOS 59  BILITOT 0.8  PROT 7.0  ALBUMIN 3.6   No results found for this basename: LIPASE, AMYLASE,  in the last 72 hours  CBC:  Recent Labs  10/03/12 1404  10/05/12 0515 10/06/12 0429  WBC 5.9  < > 4.9 4.7  NEUTROABS 4.4  --   --   --   HGB 13.6  < > 12.8 12.0  HCT 39.4  < > 37.9 35.2*  MCV 89.7  < > 90.2 90.3  PLT 146*  < > 138* 127*  < > = values in this interval not displayed.  Cardiac Enzymes:  Recent Labs  10/03/12 1404 10/03/12 2243 10/04/12 0200 10/04/12 0730  TROPONINI <0.30 <0.30 <0.30 <0.30    Coagulation Studies:  Recent Labs  10/03/12 1404 10/04/12 0200 10/05/12 0515 10/06/12 0429  LABPROT 15.4* 15.9* 17.3* 18.5*  INR 1.25 1.30 1.45  1.59*    Other: No components found with this basename: POCBNP,  No results found for this basename: DDIMER,  in the last 72 hours No results found for this basename: HGBA1C,  in the last 72 hours No results found for this basename: CHOL, HDL, LDLCALC, TRIG, CHOLHDL,  in the last 72 hours  Recent Labs  10/03/12 2243  TSH 0.011*   No results found for this basename: VITAMINB12, FOLATE, FERRITIN, TIBC, IRON, RETICCTPCT,  in the last 72 hours   Other results: Tele:  A-Fib with slow ventricular response  Medications:    Infusions: . sodium chloride 75 mL/hr at 10/06/12 0439    Scheduled Medications: . aspirin  81 mg Oral Daily  . brimonidine  1 drop Both Eyes BID   And  . timolol  1 drop Both Eyes BID  . cholecalciferol  400 Units Oral Daily  . insulin aspart  0-9 Units Subcutaneous TID WC  . losartan  25 mg Oral Daily  . magnesium oxide  400 mg Oral BID  . metFORMIN  500 mg Oral Q supper  . omega-3 acid ethyl esters  1 g Oral Daily  . Warfarin - Pharmacist Dosing Inpatient   Does not apply q1800    Assessment/ Plan:       Syncope:  Her HR has improved since stopping the beta blocker.  Echo is basically unremarkable. I think she could probably be discharged today.     A-Fib- rate is still slow.    Continue current meds  Follow up with [place] as needed.  She usually sees her doctor in [place], [place].  I would be happy for her to see [place] ( or Norma Fredrickson, NP) in several weeks if needed.      Disposition:  Length of Stay: 3  Vesta Mixer, Montez Hageman., MD, [facility] 10/06/2012, 8:05 AM Office [phone] Pager [phone]

## 2012-10-06 NOTE — Discharge Summary (Signed)
Physician Discharge Summary  Kathryn Schroeder ZOX:[id number] DOB: 1930/11/11 DOA: 10/03/2012  PCP: Romeo Rabon, MD  Admit date: 10/03/2012 Discharge date: 10/06/2012  Time spent:40 minutes  Recommendations for Outpatient Follow-up:  1. Syncope; resolved with medication changes. DCed carvedilol 2. HTN; EDC of carvedilol patient's blood pressure has increased but given her age and her symptoms this most likely will be her baseline some 3. A. fib; currently in normal sinus rhythm 4. DM; will be discharged on home regimen of diabetic medication  Discharge Diagnoses:  Principal Problem:   Syncope Active Problems:   Coronary artery disease   HTN (hypertension)   Bradycardia   A-fib   Diabetes   Discharge Condition: Stable  Diet recommendation: Diabetic  Filed Weights   10/03/12 1858  Weight: 84.5 kg (186 lb 4.6 oz)    History of present illness:  79 y.o. WF PMHx CAT, HTN, cardiac, A-Fib, DM Type 2, left BKA.  presented following a syncopal episode. She stated that when she first sat up in bed the morning of her admit she had a funny feeling in her chest which lasted a few seconds. Subsequently she reported that she sat down in the living room and began feeling dizzy and this worsened and so she told her daughter, who asked her to check her sugar. She stated she went to the kitchen table and that her daughter states that she then heard a banging noise. When she turned around her mother was shaking/jerking for a few minutes and then she slumped back in her wheelchair. Her daughter stated that during the episode she was unresponsive, but she kept trying to arouse her and then she came to it, and was able to answer questions. She stated patient was not confused and there was no incontinence or tongue biting. She admitted to feeling nauseous but no vomiting. EMS reported that when they arrived patient was clammy and she complained of feeling hot all over and that her heart rate was in the  30s . Per EDP when they tried to stand patient up she also became bradycardic to the 30s and complained of feeling dizzy. TODAY patient has been up and ambulating with no recurrent presyncopal episode. States he is a baseline ready to go home  Hospital Course:  Patient was admitted for syncopal episode on 28 June. Cardiology was consulted (Dr. Elease Hashimoto) patient was found to be symptomatic bradycardic and her carvedilol was DC'd. Post DC carvedilol patient had no recurrent symptoms. Patient also received a cardiac echo with the following results; Cardiac echo 6/29;  - Compared to prior study July 2013, no major change in LVEF, overall 60-65%. Moderate diastolic dysfunction. Significant MAC with trivial mitral reguyrgitation and moderate left atrial enlargement. Aortic stenosis is moderate overall, no change in gradient. Mild tricuspid regurgitation. Although PASP not calculated, RV-RA gradient has decreased since last assessment.    Consultations: Cardiology   Discharge Exam: Filed Vitals:   10/05/12 0800 10/05/12 1215 10/05/12 1600 10/06/12 0448  BP: 154/77 149/67 153/78 164/89  Pulse: 60 52 55 58  Temp: 98.7 F (37.1 C) 98.7 F (37.1 C) 98 F (36.7 C) 98.2 F (36.8 C)  TempSrc: Oral Oral Oral Oral  Resp: 21 20 20 20   Height:      Weight:      SpO2: 96% 95% 98% 95%    General: Alert, NAD Cardiovascular: Regular rhythm and rate, negative murmurs rubs or gallops, PT/PT pulse 1+ inRLE Respiratory: Clear to auscultation bilaterally  Abdomen; soft nontender nondistended with  bowel sounds  Discharge Instructions     Medication List    STOP taking these medications       carvedilol 12.5 MG tablet  Commonly known as:  COREG      TAKE these medications       acetaminophen 325 MG tablet  Commonly known as:  TYLENOL  Take 2 tablets (650 mg total) by mouth every 6 (six) hours as needed (or Fever >/= 101).     aspirin 81 MG tablet  Take 81 mg by mouth every morning.      COMBIGAN 0.2-0.5 % ophthalmic solution  Generic drug:  brimonidine-timolol  Place 1 drop into both eyes 2 (two) times daily.     Fish Oil 1000 MG Caps  Take 1 capsule by mouth every morning.     losartan 25 MG tablet  Commonly known as:  COZAAR  Take 25 mg by mouth daily.     magnesium oxide 400 MG tablet  Commonly known as:  MAG-OX  Take 400 mg by mouth 2 (two) times daily.     metFORMIN 500 MG tablet  Commonly known as:  GLUCOPHAGE  Take 500 mg by mouth every evening.     RED YEAST RICE PO  Take 1 tablet by mouth at bedtime.     senna-docusate 8.6-50 MG per tablet  Commonly known as:  Senokot-S  Take 1 tablet by mouth at bedtime as needed.     traMADol 50 MG tablet  Commonly known as:  ULTRAM  Take 50 mg by mouth every 8 (eight) hours as needed. For pain     Vitamin D3 400 UNITS Caps  Take 1 tablet by mouth daily.     warfarin 5 MG tablet  Commonly known as:  COUMADIN  Take 2.5-5 mg by mouth daily. Patient takes 5 mg daily except 2.5 mg on Mondays, Wednesday and Fridays       Allergies  Allergen Reactions  . Bactrim (Sulfamethoxazole W-Trimethoprim) Hives  . Janumet (Sitagliptin-Metformin Hcl) Nausea Only, Swelling and Other (See Comments)    weakness  . Adhesive (Tape)     WHITE tape: Causes blisters and skin reaction  . Ciprofloxacin Other (See Comments)    unknown  . Percocet (Oxycodone-Acetaminophen) Other (See Comments)    Makes her crazy  . Sulfa Antibiotics Hives    Hyperkalemia with bactrim  . Penicillins Itching and Rash    Childhood allergy:       The results of significant diagnostics from this hospitalization (including imaging, microbiology, ancillary and laboratory) are listed below for reference.    Significant Diagnostic Studies: No results found.  Microbiology: Recent Results (from the past 240 hour(s))  MRSA PCR SCREENING     Status: None   Collection Time    10/04/12  4:21 AM      Result Value Range Status   MRSA by PCR  NEGATIVE  NEGATIVE Final   Comment:            The GeneXpert MRSA Assay (FDA     approved for NASAL specimens     only), is one component of a     comprehensive MRSA colonization     surveillance program. It is not     intended to diagnose MRSA     infection nor to guide or     monitor treatment for     MRSA infections.     Labs: Basic Metabolic Panel:  Recent Labs Lab 10/03/12 1404 10/04/12 0200 10/05/12 0515 10/06/12  0429  NA 138 135 138 137  K 4.9 4.7 4.4 4.3  CL 102 103 105 105  CO2 27 25 27 26   GLUCOSE 190* 205* 108* 102*  BUN 22 24* 26* 25*  CREATININE 0.96 0.95 0.96 0.95  CALCIUM 9.4 9.0 9.1 8.8  MG 1.6  --  1.6  --    Liver Function Tests:  Recent Labs Lab 10/03/12 1404  AST 36  ALT 32  ALKPHOS 59  BILITOT 0.8  PROT 7.0  ALBUMIN 3.6   No results found for this basename: LIPASE, AMYLASE,  in the last 168 hours No results found for this basename: AMMONIA,  in the last 168 hours CBC:  Recent Labs Lab 10/03/12 1404 10/04/12 0200 10/05/12 0515 10/06/12 0429  WBC 5.9 6.1 4.9 4.7  NEUTROABS 4.4  --   --   --   HGB 13.6 12.3 12.8 12.0  HCT 39.4 36.0 37.9 35.2*  MCV 89.7 90.0 90.2 90.3  PLT 146* 141* 138* 127*   Cardiac Enzymes:  Recent Labs Lab 10/03/12 1404 10/03/12 2243 10/04/12 0200 10/04/12 0730  TROPONINI <0.30 <0.30 <0.30 <0.30   BNP: BNP (last 3 results)  Recent Labs  10/22/11 0341 01/14/12 1938  PROBNP 5797.0* 3478.0*   CBG:  Recent Labs Lab 10/05/12 0800 10/05/12 1116 10/05/12 1652 10/05/12 2050 10/06/12 0712  GLUCAP 94 150* 114* 134* 94       Signed:  Tylan Briguglio, J  [facility] 10/06/2012, 10:38 AM

## 2012-10-06 NOTE — Evaluation (Signed)
Physical Therapy Evaluation Patient Details Name: Kathryn Schroeder MRN: [id number] DOB: 06/12/1930 Today's Date: 10/06/2012 Time: 1201-1227 PT Time Calculation (min): 26 min  PT Assessment / Plan / Recommendation History of Present Illness  79 y.o. female who presented following a syncopal episode likely due to bradycardia.  Pt alos with hisotry of LBKA.  Clinical Impression  Pt close to baseline and moving well.  Pt being d/c home with intermittent supervision. Recommend HHPT for safety evaluation.     PT Assessment  All further PT needs can be met in the next venue of care (Pt d/c from hospital at currently at baseline)    Follow Up Recommendations  Home health PT (safety evaluation)    Equipment Recommendations  None recommended by PT    Precautions / Restrictions Precautions Precautions: None Required Braces or Orthoses: Other Brace/Splint Other Brace/Splint: left lower extremity prosthesis Restrictions Weight Bearing Restrictions: No   Pertinent Vitals/Pain No c/o pain      Mobility  Transfers Transfers: Sit to Stand;Stand to Sit Sit to Stand: 1: +2 Total assist;From chair/3-in-1;5: Supervision (lower surfaces) Sit to Stand: Patient Percentage: 50% Stand to Sit: 5: Supervision;To chair/3-in-1 Details for Transfer Assistance: Pt needs +2 (A) from lower surfaces however supervision from elevated surfaces.  Pt has lift chair at home and elevated w/c. Ambulation/Gait Ambulation/Gait Assistance: 4: Min guard Ambulation Distance (Feet): 30 Feet Assistive device: Rolling walker Ambulation/Gait Assistance Details: minguard for safety Gait Pattern: Step-through pattern Gait velocity: decreased Stairs: Yes Stairs Assistance: 4: Min guard Stair Management Technique: Forwards;With walker Number of Stairs: 1    Exercises     PT Diagnosis:    PT Problem List:   PT Treatment Interventions:       PT Goals(Current goals can be found in the care plan section)     Visit Information  Last PT Received On: 10/06/12 Assistance Needed: +1 History of Present Illness: 79 y.o. female who presented following a syncopal episode likely due to bradycardia.  Pt alos with hisotry of LBKA.       Prior Functioning  Home Living Family/patient expects to be discharged to:: Private residence Living Arrangements: Alone Available Help at Discharge: Available PRN/intermittently;Family Type of Home: House Home Access: Stairs to enter Secretary/administrator of Steps: 2 Entrance Stairs-Rails: Right Home Layout: One level Home Equipment: Bedside commode;Grab bars - tub/shower;Walker - 2 wheels Additional Comments: reacher, lift chair Prior Function Level of Independence: Independent with assistive device(s) Communication Communication: No difficulties Dominant Hand: Right    Cognition  Cognition Arousal/Alertness: Awake/alert Behavior During Therapy: WFL for tasks assessed/performed Overall Cognitive Status: Within Functional Limits for tasks assessed    Extremity/Trunk Assessment Upper Extremity Assessment Upper Extremity Assessment: Overall WFL for tasks assessed Lower Extremity Assessment Lower Extremity Assessment: Overall WFL for tasks assessed   Balance    End of Session PT - End of Session Equipment Utilized During Treatment: Gait belt;Other (comment) (left LE prosthesis) Activity Tolerance: Patient tolerated treatment well Patient left: in chair;with call bell/phone within reach Nurse Communication: Mobility status  GP     Erikah Thumm 10/06/2012, 3:03 PM Jake Shark, PT DPT [phone]

## 2013-04-07 ENCOUNTER — Encounter: Payer: Self-pay | Admitting: Cardiovascular Disease

## 2013-06-03 ENCOUNTER — Telehealth: Payer: Self-pay | Admitting: *Deleted

## 2013-06-03 NOTE — Telephone Encounter (Signed)
Called and confirmed 06/04/13 appt w/ pt's daughter Jana Half).  Unable to mail before appt letter - gave verbal.  Unable to mail welcome packet - gave directions.  Unable to mail intake form - placed note for one to be given at time of check in.

## 2013-06-04 ENCOUNTER — Encounter: Payer: Self-pay | Admitting: Oncology

## 2013-06-04 ENCOUNTER — Encounter: Payer: Self-pay | Admitting: *Deleted

## 2013-06-04 ENCOUNTER — Ambulatory Visit: Payer: Medicare Other

## 2013-06-04 ENCOUNTER — Ambulatory Visit (HOSPITAL_BASED_OUTPATIENT_CLINIC_OR_DEPARTMENT_OTHER): Payer: Medicare Other | Admitting: Oncology

## 2013-06-04 ENCOUNTER — Other Ambulatory Visit (HOSPITAL_BASED_OUTPATIENT_CLINIC_OR_DEPARTMENT_OTHER): Payer: Medicare Other

## 2013-06-04 ENCOUNTER — Other Ambulatory Visit: Payer: Self-pay | Admitting: *Deleted

## 2013-06-04 VITALS — BP 136/69 | HR 65 | Temp 98.6°F | Resp 18 | Ht 68.0 in | Wt 184.2 lb

## 2013-06-04 DIAGNOSIS — C8588 Other specified types of non-Hodgkin lymphoma, lymph nodes of multiple sites: Secondary | ICD-10-CM

## 2013-06-04 DIAGNOSIS — C8300 Small cell B-cell lymphoma, unspecified site: Secondary | ICD-10-CM

## 2013-06-04 DIAGNOSIS — E119 Type 2 diabetes mellitus without complications: Secondary | ICD-10-CM

## 2013-06-04 DIAGNOSIS — C8584 Other specified types of non-Hodgkin lymphoma, lymph nodes of axilla and upper limb: Secondary | ICD-10-CM

## 2013-06-04 DIAGNOSIS — I739 Peripheral vascular disease, unspecified: Secondary | ICD-10-CM

## 2013-06-04 LAB — COMPREHENSIVE METABOLIC PANEL (CC13)
ALT: 22 U/L (ref 0–55)
ANION GAP: 9 meq/L (ref 3–11)
AST: 22 U/L (ref 5–34)
Albumin: 3.3 g/dL — ABNORMAL LOW (ref 3.5–5.0)
Alkaline Phosphatase: 70 U/L (ref 40–150)
BILIRUBIN TOTAL: 0.3 mg/dL (ref 0.20–1.20)
BUN: 29.6 mg/dL — ABNORMAL HIGH (ref 7.0–26.0)
CO2: 26 meq/L (ref 22–29)
Calcium: 9.2 mg/dL (ref 8.4–10.4)
Chloride: 107 mEq/L (ref 98–109)
Creatinine: 1.1 mg/dL (ref 0.6–1.1)
Glucose: 246 mg/dl — ABNORMAL HIGH (ref 70–140)
POTASSIUM: 4.6 meq/L (ref 3.5–5.1)
SODIUM: 141 meq/L (ref 136–145)
TOTAL PROTEIN: 6.6 g/dL (ref 6.4–8.3)

## 2013-06-04 LAB — CBC WITH DIFFERENTIAL/PLATELET
BASO%: 0.7 % (ref 0.0–2.0)
Basophils Absolute: 0 10*3/uL (ref 0.0–0.1)
EOS%: 2.3 % (ref 0.0–7.0)
Eosinophils Absolute: 0.1 10*3/uL (ref 0.0–0.5)
HCT: 42.2 % (ref 34.8–46.6)
HGB: 13.7 g/dL (ref 11.6–15.9)
LYMPH#: 1.9 10*3/uL (ref 0.9–3.3)
LYMPH%: 30.9 % (ref 14.0–49.7)
MCH: 30.5 pg (ref 25.1–34.0)
MCHC: 32.4 g/dL (ref 31.5–36.0)
MCV: 94.1 fL (ref 79.5–101.0)
MONO#: 0.8 10*3/uL (ref 0.1–0.9)
MONO%: 12.7 % (ref 0.0–14.0)
NEUT#: 3.3 10*3/uL (ref 1.5–6.5)
NEUT%: 53.4 % (ref 38.4–76.8)
PLATELETS: 161 10*3/uL (ref 145–400)
RBC: 4.48 10*6/uL (ref 3.70–5.45)
RDW: 12.8 % (ref 11.2–14.5)
WBC: 6.1 10*3/uL (ref 3.9–10.3)

## 2013-06-04 NOTE — Progress Notes (Signed)
Checked in new pt with no financial concerns. °

## 2013-06-04 NOTE — Progress Notes (Signed)
Completed chart, Varney Biles entered labs, added to spreadsheet & placed in Dr. Virgie Dad box.

## 2013-06-05 NOTE — Progress Notes (Signed)
[place]  Telephone:(336) [phone] Fax:(336) [phone]     ID: Sloan Leiter OB: February 23, 1931  MR#: [id number]  QMG#:[id number]  PCP: Moshe Cipro, MD GYN:   SU:  OTHER MD: Joni Fears  CHIEF COMPLAINT: "They say I have cancer."  HISTORY OF PRESENT ILLNESS: Eriona had routine screening mammography at [facility] diagnostic imaging in 04/21/2013 showing stable calcifications in both breasts but a possible new area of focal asymmetry in the right breast. Additional views of the right breast 04/29/2013 showed an oval ill-defined density in the upper outer quadrant of the right breast. This had been noted previously, but had increased in size. Ultrasound of this area showed a hypoechoic relatively well defined nodule which was solid and measured 1.3 cm..  Biopsy of this area on 05/05/2013 showed ([id number] and [id number])  uniform small lymphocytes with round nuclei and scant cytoplasm. Cytokeratin was negative. The cells were positive for BCL-2, CD5, CD79A, and CD20.Marland Kitchen Cyclin D1 was negative. CD10 was negative. CD23 showed remnants of germinal centers. The lymphocytes were kappa restricted.  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 The patient's subsequent history is as detailed below  INTERVAL HISTORY: Yessica was evaluated in the oncology clinic 06/04/2013 accompanied by her 3 children Argentina Donovan and Legrand Como.  REVIEW OF SYSTEMS: The patient has multiple comorbidities and not related to the recent diagnosis and she underwent left below the knee amputation 01/16/2012 for nonhealing ulcers associated with her diabetes and peripheral vascular disease. The patient has a functional prosthesis and uses a walker about half the time. She has a wheelchair at home. She complains of problems with her dentures, and irregular heartbeat, back and joint pain, and occasional hot flashes especially at  night, but no fevers, drenching sweats, unexplained fatigue or weight loss, rash or pruritus, troublesome adenopathy, or known cytopenias. A detailed review of systems was otherwise noncontributory  PAST MEDICAL HISTORY: Past Medical History  Diagnosis Date  . Coronary artery disease     STATUS POST OLD INFERIOR WALL M.I.  . Chronic anticoagulation   . Atrial fibrillation   . Hypertension   . Hyperlipidemia   . MI, old 2008  . Shortness of breath   . Diabetes mellitus     TYPE 2  . Arthritis     PAST SURGICAL HISTORY: Past Surgical History  Procedure Laterality Date  . Appendectomy    . Total hip arthroplasty      RIGHT HIP  . Abdominal hysterectomy  1976    1976  . Cataract extraction      Bilateral  . Breast biopsy  2013  . Coronary angioplasty with stent placement    . Amputation  01/16/2012    Procedure: AMPUTATION BELOW KNEE;  Surgeon: Garald Balding, MD;  Location: [facility];  Service: Orthopedics;  Laterality: Left;  Left Below Knee Amputation    FAMILY HISTORY Family History  Problem Relation Age of Onset  . Cancer Father 51  . Cancer Mother   . Cancer Daughter   . Diabetes Daughter   . Hypertension Daughter   . Other Daughter     varicose veins   the patient could not recall her father's age of death, but he had a diagnosis of "bone cancer". The patient's mother died at the age of 55, from unclear causes, possibly related to a history of severe goiter. The patient had no brothers, one sister. There is no history of cancer in the family except  as noted  GYNECOLOGIC HISTORY:  Menarche age 38, first live birth age 76, she is [place] P3. She underwent hysterectomy in 1976, without salpingo-oophorectomy. 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 She is widowed and lives alone, with no pets. Her son Legrand Como lives practically across the street" talks or in" every night. He works as a Futures trader. Daughter Donneta Romberg lives in [place], [place] where she works as an Designer, multimedia. Daughter Kallie Edward lives in [place] where she works as a IT sales professional. The patient has 4 grandchildren and 2 great-grandchildren. She attends a local [place]: In place; the patient has named her daughter Jana Half as her healthcare power of attorney. Jana Half may be reached at [phone]   HEALTH MAINTENANCE: History  Substance Use Topics  . Smoking status: Never Smoker   . Smokeless tobacco: Never Used  . Alcohol Use: No     Colonoscopy: +  PAP: s/p hysterectomy  Bone density: -  Lipid panel:  Allergies  Allergen Reactions  . Bactrim [Sulfamethoxazole-Trimethoprim] Hives  . Janumet [Sitagliptin-Metformin Hcl] Nausea Only, Swelling and Other (See Comments)    weakness  . Adhesive [Tape]     WHITE tape: Causes blisters and skin reaction  . Ciprofloxacin Other (See Comments)    unknown  . Percocet [Oxycodone-Acetaminophen] Other (See Comments)    Makes her crazy  . Sulfa Antibiotics Hives    Hyperkalemia with bactrim  . Penicillins Itching and Rash    Childhood allergy:     Current Outpatient Prescriptions  Medication Sig Dispense Refill  . aspirin 81 MG tablet Take 81 mg by mouth every morning.       . brimonidine-timolol (COMBIGAN) 0.2-0.5 % ophthalmic solution Place 1 drop into both eyes 2 (two) times daily.      . Cholecalciferol (VITAMIN D3) 400 UNITS CAPS Take 1 tablet by mouth daily.       Marland Kitchen losartan (COZAAR) 25 MG tablet Take 25 mg by mouth daily.      . magnesium citrate SOLN Take 0.5 Bottles by mouth as needed for severe constipation.      . metFORMIN (GLUCOPHAGE) 500 MG tablet Take 500 mg by mouth every evening.       . Omega-3 Fatty Acids (FISH OIL) 1000 MG CAPS Take 1 capsule by mouth every morning.       . Red Yeast Rice Extract (RED YEAST RICE PO) Take 1 tablet by mouth at bedtime.      .  traMADol (ULTRAM) 50 MG tablet Take 50 mg by mouth every 8 (eight) hours as needed. For pain      . warfarin (COUMADIN) 5 MG tablet Take 2.5-5 mg by mouth daily. Patient takes 5 mg daily except 2.5 mg on Mondays, Wednesday and Fridays       No current facility-administered medications for this visit.    OBJECTIVE: Elderly white woman examined in a wheelchair Filed Vitals:   06/04/13 1553  BP: 136/69  Pulse: 65  Temp: 98.6 F (37 C)  Resp: 18     Body mass index is 28.01 kg/(m^2).    ECOG FS:2 - Symptomatic, <50% confined to bed  Ocular: Sclerae unicteric, EOMs intact Ear-nose-throat: Oropharynx clear, full upper and lower plate Lymphatic: No cervical or supraclavicular adenopathy; bilateral axillary adenopathy measuring 2-3 cm, rubbery and movable, with no tenderness or evidence of inflammation Lungs no  rales or rhonchi, fair excursion bilaterally Heart apparently regular rate and rhythm Abd soft, obese, nontender, positive bowel sounds MSK kyphosis and scoliosis but no focal spinal tenderness, status post left below-the-knee amputation with middle proceeds is in place Neuro: non-focal, well-oriented, positive affect Breasts: Small ecchymosis at the site of the biopsy site on the right breast, but no palpable mass, no skin change, no nipple retraction. The left breast was unremarkable   LAB RESULTS:  CMP     Component Value Date/Time   NA 141 06/04/2013 1530   NA 137 10/06/2012 0429   K 4.6 06/04/2013 1530   K 4.3 10/06/2012 0429   CL 105 10/06/2012 0429   CO2 26 06/04/2013 1530   CO2 26 10/06/2012 0429   GLUCOSE 246* 06/04/2013 1530   GLUCOSE 102* 10/06/2012 0429   BUN 29.6* 06/04/2013 1530   BUN 25* 10/06/2012 0429   CREATININE 1.1 06/04/2013 1530   CREATININE 0.95 10/06/2012 0429   CALCIUM 9.2 06/04/2013 1530   CALCIUM 8.8 10/06/2012 0429   PROT 6.6 06/04/2013 1530   PROT 7.0 10/03/2012 1404   ALBUMIN 3.3* 06/04/2013 1530   ALBUMIN 3.6 10/03/2012 1404   AST 22 06/04/2013 1530   AST 36  10/03/2012 1404   ALT 22 06/04/2013 1530   ALT 32 10/03/2012 1404   ALKPHOS 70 06/04/2013 1530   ALKPHOS 59 10/03/2012 1404   BILITOT 0.30 06/04/2013 1530   BILITOT 0.8 10/03/2012 1404   GFRNONAA 55* 10/06/2012 0429   GFRAA 64* 10/06/2012 0429    I No results found for this basename: SPEP, UPEP,  kappa and lambda light chains    Lab Results  Component Value Date   WBC 6.1 06/04/2013   NEUTROABS 3.3 06/04/2013   HGB 13.7 06/04/2013   HCT 42.2 06/04/2013   MCV 94.1 06/04/2013   PLT 161 06/04/2013      Chemistry      Component Value Date/Time   NA 141 06/04/2013 1530   NA 137 10/06/2012 0429   K 4.6 06/04/2013 1530   K 4.3 10/06/2012 0429   CL 105 10/06/2012 0429   CO2 26 06/04/2013 1530   CO2 26 10/06/2012 0429   BUN 29.6* 06/04/2013 1530   BUN 25* 10/06/2012 0429   CREATININE 1.1 06/04/2013 1530   CREATININE 0.95 10/06/2012 0429      Component Value Date/Time   CALCIUM 9.2 06/04/2013 1530   CALCIUM 8.8 10/06/2012 0429   ALKPHOS 70 06/04/2013 1530   ALKPHOS 59 10/03/2012 1404   AST 22 06/04/2013 1530   AST 36 10/03/2012 1404   ALT 22 06/04/2013 1530   ALT 32 10/03/2012 1404   BILITOT 0.30 06/04/2013 1530   BILITOT 0.8 10/03/2012 1404       No results found for this basename: LABCA2    No components found with this basename: LABCA125    No results found for this basename: INR,  in the last 168 hours  Urinalysis    Component Value Date/Time   COLORURINE YELLOW 10/03/2012 1536   APPEARANCEUR CLOUDY* 10/03/2012 1536   LABSPEC 1.015 10/03/2012 1536   PHURINE 7.5 10/03/2012 1536   GLUCOSEU 250* 10/03/2012 [place] 10/03/2012 [place] 10/03/2012 [place] 10/03/2012 1536   PROTEINUR NEGATIVE 10/03/2012 1536   UROBILINOGEN 0.2 10/03/2012 1536   NITRITE NEGATIVE 10/03/2012 [place] 10/03/2012 1536    STUDIES: No results found.   ASSESSMENT: 80 y.o. [place], [place] status  post right breast biopsy 05/05/2013 for a small lymphocytic  non-Hodgkin's lymphoma, kappa restricted, clinically stage II, C5 and CD20 positive, CD23 equivocal, CD10 negative  PLAN: We spent the better part of today's hour-long appointment discussing the biology of lymphomas in general, and the specifics of the patient's tumor in particular. The patient and her family understand that low grade non-Hodgkin's lymphomas like this one are not curable. There are however multiple treatments, many of which are very well tolerated. The goal of treatment is control.  We then discussed the fact that many patients do not require any treatment for long periods of time. Indications for treatment were reviewed. They include drenching night sweats, persistent unexplained fevers, whole body pruritus or rash, disfiguring or troublesome adenopathy, unexplained loss of 10% or of more baseline body weight, or unexplained severe fatigue. Additional indications would be anemia or thrombocytopenia is not due to autoimmune etiologies.  Saki meets none of these criteria. Accordingly I am comfortable proceeding with observation only. The plan will be for her to return in 3 months, chiefly for further discussion, and then we will follow her labwork on an every 3 month basis with visits yearly. Again, there are many available treatments which she could tolerate well which would bring her lymphoma under good control, if we needed to do that.  Freddi Starr has a good understanding of the overall plan. She agrees with it. She or her family will call with any problems that may develop before next visit here.   Chauncey Cruel, MD   06/05/2013 8:04 AM

## 2013-06-08 ENCOUNTER — Telehealth: Payer: Self-pay | Admitting: Oncology

## 2013-06-08 NOTE — Telephone Encounter (Signed)
, °

## 2013-09-02 ENCOUNTER — Other Ambulatory Visit (HOSPITAL_BASED_OUTPATIENT_CLINIC_OR_DEPARTMENT_OTHER): Payer: Medicare Other

## 2013-09-02 ENCOUNTER — Ambulatory Visit (HOSPITAL_BASED_OUTPATIENT_CLINIC_OR_DEPARTMENT_OTHER): Payer: Medicare Other | Admitting: Oncology

## 2013-09-02 VITALS — BP 148/73 | HR 59 | Temp 98.2°F | Resp 18 | Ht 68.0 in | Wt 188.7 lb

## 2013-09-02 DIAGNOSIS — C8599 Non-Hodgkin lymphoma, unspecified, extranodal and solid organ sites: Secondary | ICD-10-CM

## 2013-09-02 DIAGNOSIS — C8588 Other specified types of non-Hodgkin lymphoma, lymph nodes of multiple sites: Secondary | ICD-10-CM

## 2013-09-02 DIAGNOSIS — C8300 Small cell B-cell lymphoma, unspecified site: Secondary | ICD-10-CM

## 2013-09-02 LAB — CBC WITH DIFFERENTIAL/PLATELET
BASO%: 1 % (ref 0.0–2.0)
Basophils Absolute: 0.1 10*3/uL (ref 0.0–0.1)
EOS%: 2.5 % (ref 0.0–7.0)
Eosinophils Absolute: 0.1 10*3/uL (ref 0.0–0.5)
HEMATOCRIT: 42.1 % (ref 34.8–46.6)
HGB: 13.7 g/dL (ref 11.6–15.9)
LYMPH%: 24.7 % (ref 14.0–49.7)
MCH: 30.8 pg (ref 25.1–34.0)
MCHC: 32.6 g/dL (ref 31.5–36.0)
MCV: 94.5 fL (ref 79.5–101.0)
MONO#: 0.8 10*3/uL (ref 0.1–0.9)
MONO%: 13.9 % (ref 0.0–14.0)
NEUT#: 3.4 10*3/uL (ref 1.5–6.5)
NEUT%: 57.9 % (ref 38.4–76.8)
PLATELETS: 173 10*3/uL (ref 145–400)
RBC: 4.46 10*6/uL (ref 3.70–5.45)
RDW: 13 % (ref 11.2–14.5)
WBC: 5.8 10*3/uL (ref 3.9–10.3)
lymph#: 1.4 10*3/uL (ref 0.9–3.3)

## 2013-09-02 LAB — BASIC METABOLIC PANEL (CC13)
Anion Gap: 8 mEq/L (ref 3–11)
BUN: 29.7 mg/dL — ABNORMAL HIGH (ref 7.0–26.0)
CO2: 26 mEq/L (ref 22–29)
Calcium: 9.6 mg/dL (ref 8.4–10.4)
Chloride: 105 mEq/L (ref 98–109)
Creatinine: 1.1 mg/dL (ref 0.6–1.1)
Glucose: 177 mg/dl — ABNORMAL HIGH (ref 70–140)
Potassium: 5.6 mEq/L — ABNORMAL HIGH (ref 3.5–5.1)
Sodium: 140 mEq/L (ref 136–145)

## 2013-09-02 LAB — LACTATE DEHYDROGENASE (CC13): LDH: 222 U/L (ref 125–245)

## 2013-09-02 NOTE — Progress Notes (Signed)
[place]  Telephone:(336) [phone] Fax:(336) [phone]     ID: Sloan Leiter OB: [birth year]  MR#: [id number]  PIR#:[id number]  PCP: Moshe Cipro, MD GYN:   SU:  OTHER MD: Joni Fears  CHIEF COMPLAINT: Low-grade lymphoma TREATMENT: Observation  HISTORY OF PRESENT ILLNESS: From the original intake note:  Hailly had routine screening mammography at [facility] in 04/21/2013 showing stable calcifications in both breasts but a possible new area of focal asymmetry in the right breast. Additional views of the right breast 04/29/2013 showed an oval ill-defined density in the upper outer quadrant of the right breast. This had been noted previously, but had increased in size. Ultrasound of this area showed a hypoechoic relatively well defined nodule which was solid and measured 1.3 cm..  Biopsy of this area on 05/05/2013 showed ([id number] and [id number])  uniform small lymphocytes with round nuclei and scant cytoplasm. Cytokeratin was negative. The cells were positive for BCL-2, CD5, CD79A, and CD20.Marland Kitchen Cyclin D1 was negative. CD10 was negative. CD23 showed remnants of germinal centers. The lymphocytes were kappa restricted.  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   The patient's subsequent history is as detailed below  INTERVAL HISTORY: Keli returns today accompanied by her daughter Jana Half. The interval history is unremarkable. She stays mostly in the house, but does do some housework, a little bit of cooking, washes the dishes, and does some canning of the multiple delicious sounding vegetables her family brings are from their garden.  REVIEW OF SYSTEMS: Adele denies any fevers, drenching sweats, change in weight, or adenopathy. Of course she has multiple comorbidities which have been previously discussed. A detailed review of systems today however was entirely  stable.  PAST MEDICAL HISTORY: Past Medical History  Diagnosis Date  . Coronary artery disease     STATUS POST OLD INFERIOR WALL M.I.  . Chronic anticoagulation   . Atrial fibrillation   . Hypertension   . Hyperlipidemia   . MI, old 2008  . Shortness of breath   . Diabetes mellitus     TYPE 2  . Arthritis     PAST SURGICAL HISTORY: Past Surgical History  Procedure Laterality Date  . Appendectomy    . Total hip arthroplasty      RIGHT HIP  . Abdominal hysterectomy  1976    1976  . Cataract extraction      Bilateral  . Breast biopsy  2013  . Coronary angioplasty with stent placement    . Amputation  01/16/2012    Procedure: AMPUTATION BELOW KNEE;  Surgeon: Garald Balding, MD;  Location: [facility];  Service: Orthopedics;  Laterality: Left;  Left Below Knee Amputation    FAMILY HISTORY Family History  Problem Relation Age of Onset  . Cancer Father 45  . Cancer Mother   . Cancer Daughter   . Diabetes Daughter   . Hypertension Daughter   . Other Daughter     varicose veins   the patient could not recall her father's age of death, but he had a diagnosis of "bone cancer". The patient's mother died at the age of 46, from unclear causes, possibly related to a history of severe goiter. The patient had no brothers, one sister. There is no history of cancer in the family except as noted  GYNECOLOGIC HISTORY:  Menarche age 89, first live birth age 40, she is [place] P3. She underwent hysterectomy in 1976, without salpingo-oophorectomy. 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 She is widowed and lives alone, with no pets. Her son Legrand Como lives practically across the street" talks or in" every night. He works as a Administrator. Daughter Donneta Romberg lives in [place], [place] where she works as an Designer, multimedia. Daughter Kallie Edward lives in [place] where she works as a Programmer, applications. The patient has 4 grandchildren and 2 great-grandchildren. She attends a local [place]: In place; the patient has named her daughter Jana Half as her healthcare power of attorney. Jana Half may be reached at [phone]   HEALTH MAINTENANCE: History  Substance Use Topics  . Smoking status: Never Smoker   . Smokeless tobacco: Never Used  . Alcohol Use: No     PAP: s/p hysterectomy  Bone density: -  Lipid panel:  Allergies  Allergen Reactions  . Bactrim [Sulfamethoxazole-Trimethoprim] Hives  . Janumet [Sitagliptin-Metformin Hcl] Nausea Only, Swelling and Other (See Comments)    weakness  . Adhesive [Tape]     WHITE tape: Causes blisters and skin reaction  . Ciprofloxacin Other (See Comments)    unknown  . Percocet [Oxycodone-Acetaminophen] Other (See Comments)    Makes her crazy  . Sulfa Antibiotics Hives    Hyperkalemia with bactrim  . Penicillins Itching and Rash    Childhood allergy:     Current Outpatient Prescriptions  Medication Sig Dispense Refill  . aspirin 81 MG tablet Take 81 mg by mouth every morning.       . brimonidine-timolol (COMBIGAN) 0.2-0.5 % ophthalmic solution Place 1 drop into both eyes 2 (two) times daily.      . Cholecalciferol (VITAMIN D3) 400 UNITS CAPS Take 1 tablet by mouth daily.       Marland Kitchen losartan (COZAAR) 25 MG tablet Take 25 mg by mouth daily.      . magnesium citrate SOLN Take 0.5 Bottles by mouth as needed for severe constipation.      . metFORMIN (GLUCOPHAGE) 500 MG tablet Take 500 mg by mouth every evening.       . Omega-3 Fatty Acids (FISH OIL) 1000 MG CAPS Take 1 capsule by mouth every morning.       . Red Yeast Rice Extract (RED YEAST RICE PO) Take 1 tablet by mouth at bedtime.      . traMADol (ULTRAM) 50 MG tablet Take 50 mg by mouth every 8 (eight) hours as needed. For pain      . warfarin (COUMADIN) 5 MG tablet Take 2.5-5 mg by mouth daily. Patient takes 5 mg daily except 2.5 mg on  Mondays, Wednesday and Fridays       No current facility-administered medications for this visit.    OBJECTIVE: Elderly white woman examined in a wheelchair Filed Vitals:   09/02/13 1531  BP: 148/73  Pulse: 59  Temp: 98.2 F (36.8 C)  Resp: 18     Body mass index is 28.7 kg/(m^2).    ECOG FS:2 - Symptomatic, <50% confined to bed  Ocular: Sclerae unicteric, pupils round and equal t Ear-nose-throat: Oropharynx clear, full upper and lower plate Lymphatic: No cervical or supraclavicular adenopathy; there is a small, flat, soft left axillary lymph node measuring approximately 1 cm. I do not palpate any right axillary or bilateral inguinal adenopathy Lungs no rales or rhonchi Heart regular rate and rhythm today Abd soft, obese, nontender, positive bowel sounds MSK kyphosis and  scoliosis but no focal spinal tenderness, status post left below-the-knee amputation with prosthesis in place Neuro: non-focal, well-oriented, positive affect Breasts: Deferred   LAB RESULTS:  CMP     Component Value Date/Time   NA 140 09/02/2013 1448   NA 137 10/06/2012 0429   K 5.6* 09/02/2013 1448   K 4.3 10/06/2012 0429   CL 105 10/06/2012 0429   CO2 26 09/02/2013 1448   CO2 26 10/06/2012 0429   GLUCOSE 177* 09/02/2013 1448   GLUCOSE 102* 10/06/2012 0429   BUN 29.7* 09/02/2013 1448   BUN 25* 10/06/2012 0429   CREATININE 1.1 09/02/2013 1448   CREATININE 0.95 10/06/2012 0429   CALCIUM 9.6 09/02/2013 1448   CALCIUM 8.8 10/06/2012 0429   PROT 6.6 06/04/2013 1530   PROT 7.0 10/03/2012 1404   ALBUMIN 3.3* 06/04/2013 1530   ALBUMIN 3.6 10/03/2012 1404   AST 22 06/04/2013 1530   AST 36 10/03/2012 1404   ALT 22 06/04/2013 1530   ALT 32 10/03/2012 1404   ALKPHOS 70 06/04/2013 1530   ALKPHOS 59 10/03/2012 1404   BILITOT 0.30 06/04/2013 1530   BILITOT 0.8 10/03/2012 1404   GFRNONAA 55* 10/06/2012 0429   GFRAA 64* 10/06/2012 0429    I No results found for this basename: SPEP,  UPEP,   kappa and lambda light chains    Lab Results    Component Value Date   WBC 5.8 09/02/2013   NEUTROABS 3.4 09/02/2013   HGB 13.7 09/02/2013   HCT 42.1 09/02/2013   MCV 94.5 09/02/2013   PLT 173 09/02/2013      Chemistry      Component Value Date/Time   NA 140 09/02/2013 1448   NA 137 10/06/2012 0429   K 5.6* 09/02/2013 1448   K 4.3 10/06/2012 0429   CL 105 10/06/2012 0429   CO2 26 09/02/2013 1448   CO2 26 10/06/2012 0429   BUN 29.7* 09/02/2013 1448   BUN 25* 10/06/2012 0429   CREATININE 1.1 09/02/2013 1448   CREATININE 0.95 10/06/2012 0429      Component Value Date/Time   CALCIUM 9.6 09/02/2013 1448   CALCIUM 8.8 10/06/2012 0429   ALKPHOS 70 06/04/2013 1530   ALKPHOS 59 10/03/2012 1404   AST 22 06/04/2013 1530   AST 36 10/03/2012 1404   ALT 22 06/04/2013 1530   ALT 32 10/03/2012 1404   BILITOT 0.30 06/04/2013 1530   BILITOT 0.8 10/03/2012 1404       No results found for this basename: LABCA2    No components found with this basename: LABCA125    No results found for this basename: INR,  in the last 168 hours  Urinalysis    Component Value Date/Time   COLORURINE YELLOW 10/03/2012 1536   APPEARANCEUR CLOUDY* 10/03/2012 1536   LABSPEC 1.015 10/03/2012 1536   PHURINE 7.5 10/03/2012 1536   GLUCOSEU 250* 10/03/2012 [place] 10/03/2012 [place] 10/03/2012 1536   KETONESUR NEGATIVE 10/03/2012 1536   PROTEINUR NEGATIVE 10/03/2012 1536   UROBILINOGEN 0.2 10/03/2012 1536   NITRITE NEGATIVE 10/03/2012 [place] 10/03/2012 1536    STUDIES: No results found.  ASSESSMENT: 80 y.o. [place], [place] status post right breast biopsy 05/05/2013 for a small lymphocytic non-Hodgkin's lymphoma, kappa restricted, clinically stage II, C5 and CD20 positive, CD23 equivocal, CD10 negative  PLAN: Yina has a low-grade small lymphocytic lymphoma which appears to be in no hurry to cause her any problems. I am very comfortable  releasing her to her primary care physician's care at this point.  If Richard develops unexplained  weight loss of 10% of her baseline body weight, unexplained fevers or drenching sweats, or if she develops unexplained anemia or thrombocytopenia, or finally if there is disfiguring adenopathy, we would consider starting rituximab therapy or other similar well-tolerated effective treatments for her problem. At this point however there is no indication for treatment.   I am not making any future routine appointment for [facility] here, but she knows that we'll be glad to see her again at any point if the need arises.   Chauncey Cruel, MD   09/02/2013 3:44 PM

## 2013-09-06 ENCOUNTER — Telehealth: Payer: Self-pay | Admitting: Oncology

## 2013-09-06 NOTE — Telephone Encounter (Signed)
Sent letter to Dr. Moshe Cipro office from Dr. Jana Hakim

## 2014-04-21 ENCOUNTER — Encounter (HOSPITAL_COMMUNITY): Payer: Self-pay | Admitting: Vascular Surgery

## 2014-11-11 ENCOUNTER — Encounter (HOSPITAL_COMMUNITY): Payer: Self-pay

## 2014-11-11 ENCOUNTER — Emergency Department (HOSPITAL_COMMUNITY): Payer: Medicare Other

## 2014-11-11 ENCOUNTER — Inpatient Hospital Stay (HOSPITAL_COMMUNITY)
Admission: EM | Admit: 2014-11-11 | Discharge: 2014-12-08 | DRG: 064 | Disposition: E | Payer: Medicare Other | Attending: Pulmonary Disease | Admitting: Pulmonary Disease

## 2014-11-11 DIAGNOSIS — I2510 Atherosclerotic heart disease of native coronary artery without angina pectoris: Secondary | ICD-10-CM | POA: Diagnosis present

## 2014-11-11 DIAGNOSIS — J9600 Acute respiratory failure, unspecified whether with hypoxia or hypercapnia: Secondary | ICD-10-CM | POA: Diagnosis present

## 2014-11-11 DIAGNOSIS — I4891 Unspecified atrial fibrillation: Secondary | ICD-10-CM | POA: Diagnosis present

## 2014-11-11 DIAGNOSIS — I252 Old myocardial infarction: Secondary | ICD-10-CM | POA: Diagnosis not present

## 2014-11-11 DIAGNOSIS — Z882 Allergy status to sulfonamides status: Secondary | ICD-10-CM | POA: Diagnosis not present

## 2014-11-11 DIAGNOSIS — I10 Essential (primary) hypertension: Secondary | ICD-10-CM | POA: Diagnosis present

## 2014-11-11 DIAGNOSIS — E875 Hyperkalemia: Secondary | ICD-10-CM | POA: Diagnosis present

## 2014-11-11 DIAGNOSIS — Z885 Allergy status to narcotic agent status: Secondary | ICD-10-CM

## 2014-11-11 DIAGNOSIS — E785 Hyperlipidemia, unspecified: Secondary | ICD-10-CM | POA: Diagnosis present

## 2014-11-11 DIAGNOSIS — G935 Compression of brain: Secondary | ICD-10-CM | POA: Diagnosis present

## 2014-11-11 DIAGNOSIS — E861 Hypovolemia: Secondary | ICD-10-CM | POA: Diagnosis present

## 2014-11-11 DIAGNOSIS — R40243 Glasgow coma scale score 3-8: Secondary | ICD-10-CM | POA: Diagnosis present

## 2014-11-11 DIAGNOSIS — Z881 Allergy status to other antibiotic agents status: Secondary | ICD-10-CM

## 2014-11-11 DIAGNOSIS — E872 Acidosis: Secondary | ICD-10-CM | POA: Diagnosis present

## 2014-11-11 DIAGNOSIS — Z515 Encounter for palliative care: Secondary | ICD-10-CM | POA: Diagnosis not present

## 2014-11-11 DIAGNOSIS — Z7901 Long term (current) use of anticoagulants: Secondary | ICD-10-CM

## 2014-11-11 DIAGNOSIS — R4020 Unspecified coma: Secondary | ICD-10-CM | POA: Diagnosis present

## 2014-11-11 DIAGNOSIS — G936 Cerebral edema: Secondary | ICD-10-CM | POA: Diagnosis present

## 2014-11-11 DIAGNOSIS — G911 Obstructive hydrocephalus: Secondary | ICD-10-CM | POA: Diagnosis present

## 2014-11-11 DIAGNOSIS — E119 Type 2 diabetes mellitus without complications: Secondary | ICD-10-CM | POA: Diagnosis present

## 2014-11-11 DIAGNOSIS — Z96641 Presence of right artificial hip joint: Secondary | ICD-10-CM | POA: Diagnosis present

## 2014-11-11 DIAGNOSIS — Z880 Allergy status to penicillin: Secondary | ICD-10-CM | POA: Diagnosis not present

## 2014-11-11 DIAGNOSIS — Z7982 Long term (current) use of aspirin: Secondary | ICD-10-CM | POA: Diagnosis not present

## 2014-11-11 DIAGNOSIS — M6282 Rhabdomyolysis: Secondary | ICD-10-CM | POA: Diagnosis present

## 2014-11-11 DIAGNOSIS — J95821 Acute postprocedural respiratory failure: Secondary | ICD-10-CM | POA: Diagnosis not present

## 2014-11-11 DIAGNOSIS — Z89512 Acquired absence of left leg below knee: Secondary | ICD-10-CM | POA: Diagnosis not present

## 2014-11-11 DIAGNOSIS — D72829 Elevated white blood cell count, unspecified: Secondary | ICD-10-CM | POA: Diagnosis present

## 2014-11-11 DIAGNOSIS — Z91048 Other nonmedicinal substance allergy status: Secondary | ICD-10-CM

## 2014-11-11 DIAGNOSIS — H409 Unspecified glaucoma: Secondary | ICD-10-CM | POA: Diagnosis present

## 2014-11-11 DIAGNOSIS — J9601 Acute respiratory failure with hypoxia: Secondary | ICD-10-CM | POA: Diagnosis not present

## 2014-11-11 DIAGNOSIS — N179 Acute kidney failure, unspecified: Secondary | ICD-10-CM | POA: Diagnosis present

## 2014-11-11 DIAGNOSIS — Z833 Family history of diabetes mellitus: Secondary | ICD-10-CM | POA: Diagnosis not present

## 2014-11-11 DIAGNOSIS — I619 Nontraumatic intracerebral hemorrhage, unspecified: Secondary | ICD-10-CM | POA: Diagnosis present

## 2014-11-11 DIAGNOSIS — R4182 Altered mental status, unspecified: Secondary | ICD-10-CM | POA: Diagnosis present

## 2014-11-11 DIAGNOSIS — Z8249 Family history of ischemic heart disease and other diseases of the circulatory system: Secondary | ICD-10-CM | POA: Diagnosis not present

## 2014-11-11 DIAGNOSIS — Z66 Do not resuscitate: Secondary | ICD-10-CM | POA: Diagnosis not present

## 2014-11-11 LAB — CBC WITH DIFFERENTIAL/PLATELET
BASOS ABS: 0 10*3/uL (ref 0.0–0.1)
Basophils Relative: 0 % (ref 0–1)
EOS ABS: 0 10*3/uL (ref 0.0–0.7)
EOS PCT: 0 % (ref 0–5)
HEMATOCRIT: 44.3 % (ref 36.0–46.0)
HEMOGLOBIN: 15.3 g/dL — AB (ref 12.0–15.0)
LYMPHS ABS: 1.6 10*3/uL (ref 0.7–4.0)
Lymphocytes Relative: 9 % — ABNORMAL LOW (ref 12–46)
MCH: 32.1 pg (ref 26.0–34.0)
MCHC: 34.5 g/dL (ref 30.0–36.0)
MCV: 92.9 fL (ref 78.0–100.0)
Monocytes Absolute: 1.9 10*3/uL — ABNORMAL HIGH (ref 0.1–1.0)
Monocytes Relative: 10 % (ref 3–12)
NEUTROS ABS: 15.3 10*3/uL — AB (ref 1.7–7.7)
Neutrophils Relative %: 81 % — ABNORMAL HIGH (ref 43–77)
PLATELETS: 190 10*3/uL (ref 150–400)
RBC: 4.77 MIL/uL (ref 3.87–5.11)
RDW: 12.8 % (ref 11.5–15.5)
WBC: 18.8 10*3/uL — AB (ref 4.0–10.5)

## 2014-11-11 LAB — I-STAT CHEM 8, ED
BUN: 44 mg/dL — ABNORMAL HIGH (ref 6–20)
CREATININE: 1.1 mg/dL — AB (ref 0.44–1.00)
Calcium, Ion: 1.17 mmol/L (ref 1.13–1.30)
Chloride: 104 mmol/L (ref 101–111)
GLUCOSE: 334 mg/dL — AB (ref 65–99)
HCT: 47 % — ABNORMAL HIGH (ref 36.0–46.0)
HEMOGLOBIN: 16 g/dL — AB (ref 12.0–15.0)
POTASSIUM: 5.5 mmol/L — AB (ref 3.5–5.1)
Sodium: 139 mmol/L (ref 135–145)
TCO2: 21 mmol/L (ref 0–100)

## 2014-11-11 LAB — COMPREHENSIVE METABOLIC PANEL
ALBUMIN: 3.8 g/dL (ref 3.5–5.0)
ALT: 31 U/L (ref 14–54)
AST: 67 U/L — ABNORMAL HIGH (ref 15–41)
Alkaline Phosphatase: 51 U/L (ref 38–126)
Anion gap: 18 — ABNORMAL HIGH (ref 5–15)
BUN: 30 mg/dL — AB (ref 6–20)
CO2: 18 mmol/L — ABNORMAL LOW (ref 22–32)
CREATININE: 1.31 mg/dL — AB (ref 0.44–1.00)
Calcium: 9.9 mg/dL (ref 8.9–10.3)
Chloride: 103 mmol/L (ref 101–111)
GFR calc non Af Amer: 37 mL/min — ABNORMAL LOW (ref >60)
GFR, EST AFRICAN AMERICAN: 43 mL/min — AB (ref >60)
Glucose, Bld: 341 mg/dL — ABNORMAL HIGH (ref 65–99)
Potassium: 5.6 mmol/L — ABNORMAL HIGH (ref 3.5–5.1)
Sodium: 139 mmol/L (ref 135–145)
Total Bilirubin: 1.2 mg/dL (ref 0.3–1.2)
Total Protein: 7.5 g/dL (ref 6.5–8.1)

## 2014-11-11 LAB — URINALYSIS, ROUTINE W REFLEX MICROSCOPIC
Glucose, UA: >1000 mg/dL — AB
Ketones, ur: 15 mg/dL — AB
Leukocytes, UA: NEGATIVE
Nitrite: NEGATIVE
Protein, ur: NEGATIVE mg/dL
SPECIFIC GRAVITY, URINE: 1.025 (ref 1.005–1.030)
UROBILINOGEN UA: 0.2 mg/dL (ref 0.0–1.0)
pH: 5 (ref 5.0–8.0)

## 2014-11-11 LAB — ETHANOL: Alcohol, Ethyl (B): <5 mg/dL (ref <5)

## 2014-11-11 LAB — CK: CK TOTAL: 1959 U/L — AB (ref 38–234)

## 2014-11-11 LAB — PROTIME-INR
INR: 3.15 — ABNORMAL HIGH (ref 0.00–1.49)
Prothrombin Time: 31.8 seconds — ABNORMAL HIGH (ref 11.6–15.2)

## 2014-11-11 LAB — URINE MICROSCOPIC-ADD ON

## 2014-11-11 LAB — I-STAT TROPONIN, ED: TROPONIN I, POC: 1.58 ng/mL — AB (ref 0.00–0.08)

## 2014-11-11 LAB — I-STAT CG4 LACTIC ACID, ED: LACTIC ACID, VENOUS: 5.13 mmol/L — AB (ref 0.5–2.0)

## 2014-11-11 MED ORDER — SUCCINYLCHOLINE CHLORIDE 20 MG/ML IJ SOLN
INTRAMUSCULAR | Status: AC
  Filled 2014-11-11: qty 1

## 2014-11-11 MED ORDER — STROKE: EARLY STAGES OF RECOVERY BOOK
Freq: Once | Status: DC
  Filled 2014-11-11: qty 1

## 2014-11-11 MED ORDER — PROPOFOL 1000 MG/100ML IV EMUL: 5.0000 ug/kg/min | INTRAVENOUS | Status: DC

## 2014-11-11 MED ORDER — PANTOPRAZOLE SODIUM 40 MG IV SOLR: 40.0000 mg | Freq: Every day | INTRAVENOUS | Status: DC

## 2014-11-11 MED ORDER — FENTANYL BOLUS VIA INFUSION: 25.0000 ug | INTRAVENOUS | Status: DC | PRN

## 2014-11-11 MED ORDER — FENTANYL CITRATE (PF) 100 MCG/2ML IJ SOLN: 50.0000 ug | Freq: Once | INTRAMUSCULAR | Status: DC

## 2014-11-11 MED ORDER — ROCURONIUM BROMIDE 50 MG/5ML IV SOLN
INTRAVENOUS | Status: AC
  Administered 2014-11-11: 100 mg
  Filled 2014-11-11: qty 2

## 2014-11-11 MED ORDER — LIDOCAINE HCL (CARDIAC) 20 MG/ML IV SOLN
INTRAVENOUS | Status: AC
  Filled 2014-11-11: qty 5

## 2014-11-11 MED ORDER — SODIUM CHLORIDE 0.9 % IV SOLN: 25.0000 ug/h | INTRAVENOUS | Status: DC

## 2014-11-11 MED ORDER — MORPHINE SULFATE 25 MG/ML IV SOLN
1.0000 mg/h | INTRAVENOUS | Status: DC
  Administered 2014-11-12: 1 mg/h via INTRAVENOUS
  Filled 2014-11-11: qty 10

## 2014-11-11 MED ORDER — MORPHINE BOLUS VIA INFUSION
4.0000 mg | INTRAVENOUS | Status: DC | PRN
  Administered 2014-11-12 – 2014-11-13 (×4): 4 mg via INTRAVENOUS
  Filled 2014-11-11 (×5): qty 4

## 2014-11-11 MED ORDER — ETOMIDATE 2 MG/ML IV SOLN
INTRAVENOUS | Status: AC
  Administered 2014-11-11: 15 mg
  Filled 2014-11-11: qty 20

## 2014-11-11 NOTE — ED Notes (Addendum)
Per [facility], pt lives by self, checked on by family today and found on floor, unknown downtime. Had snoring respirations. Last seen was last night. Responds to painful stimuli. Has glaucoma but pupils are unequal. 20g to [facility]. Some purposeful movement to left arm, not moving right side. Had periods of apnea. Given 2 mg of narcan. Was measuring capnography, and CBG was 252

## 2014-11-12 DIAGNOSIS — I619 Nontraumatic intracerebral hemorrhage, unspecified: Principal | ICD-10-CM

## 2014-11-12 DIAGNOSIS — J9601 Acute respiratory failure with hypoxia: Secondary | ICD-10-CM

## 2014-11-12 DIAGNOSIS — J95821 Acute postprocedural respiratory failure: Secondary | ICD-10-CM

## 2014-11-12 LAB — MRSA PCR SCREENING: MRSA by PCR: NEGATIVE

## 2014-11-12 MED ORDER — CHLORHEXIDINE GLUCONATE 0.12% ORAL RINSE (MEDLINE KIT)
15.0000 mL | Freq: Two times a day (BID) | OROMUCOSAL | Status: DC
  Administered 2014-11-12 (×2): 15 mL via OROMUCOSAL

## 2014-11-12 MED ORDER — ANTISEPTIC ORAL RINSE SOLUTION (CORINZ)
7.0000 mL | Freq: Four times a day (QID) | OROMUCOSAL | Status: DC
  Administered 2014-11-12 – 2014-11-13 (×4): 7 mL via OROMUCOSAL

## 2014-11-12 NOTE — ED Provider Notes (Signed)
INTUBATION Performed by: Theodosia Quay Authorized by: Davonna Belling  Required items: required blood products, implants, devices, and special equipment available Patient identity confirmed: provided demographic data and hospital-assigned identification number Time out: Immediately prior to procedure a "time out" was called to verify the correct patient, procedure, equipment, support staff and site/side marked as required.  Indications: airway protection  Intubation method: Glidescope Laryngoscopy   Preoxygenation: BVM  Sedatives: Etomidate 15 mg IV Paralytic: Rocuronium 100 mg IV  Tube Size: 7.5 cuffed  Post-procedure assessment: chest rise and ETCO2 monitor Breath sounds: equal and absent over the epigastrium Tube secured with: ETT holder Chest x-ray interpreted by radiologist and me.  Chest x-ray findings: endotracheal tube in appropriate position  Patient tolerated the procedure well with no immediate complications.       Theodosia Quay, MD 11/12/14 [id number]  Davonna Belling, MD 11/12/14 [phone]

## 2014-11-12 NOTE — Progress Notes (Signed)
PCCM PROGRESS NOTE  Kathryn Schroeder is a 81 y.o. female admitted on 11/26/2014 with altered consciousness after she was found at home on the floor.  She was intubated for airway protection.  She has hx of a fib and has been on coumadin.  CT head showed massive ICH.  She was made DNR.  SUBJECTIVE: No acute change.  Admitted overnight with catastrophic ICH.  Grimaces occasionally otherwise appears comfortable on morphine gtt 1mg /hr.   OBJECTIVE: Temp:  [92.8 F (33.8 C)-96.8 F (36 C)] 96.8 F (36 C) (08/06 0230) Pulse Rate:  [27-96] 56 (08/06 0230) Resp:  [12-22] 17 (08/06 0230) BP: (71-147)/(45-97) 78/51 mmHg (08/06 0230) SpO2:  [89 %-100 %] 100 % (08/06 0230) FiO2 (%):  [60 %-100 %] 60 % (08/06 0215) Weight:  [83.915 kg (185 lb)-84 kg (185 lb 3 oz)] 84 kg (185 lb 3 oz) (08/05 2249)  General: elderly female, NAD on vent  HEENT: mm dry, ETT  Cardiac: s1s2 irreg  Lungs: resps even non labored on vent, few scattered rhonchi   Abd: soft, non tender, +bs  Ext: cool, dry, no sig edema  Neuro:  Localizes to pain, grimaces occasionally, does not follow commands, does not open eyes, does not breathe over vent  CMP Latest Ref Rng 11/09/2014 11/28/2014 09/02/2013  Glucose 65 - 99 mg/dL 334(H) 341(H) 177(H)  BUN 6 - 20 mg/dL 44(H) 30(H) 29.7(H)  Creatinine 0.44 - 1.00 mg/dL 1.10(H) 1.31(H) 1.1  Sodium 135 - 145 mmol/L 139 139 140  Potassium 3.5 - 5.1 mmol/L 5.5(H) 5.6(H) 5.6(H)  Chloride 101 - 111 mmol/L 104 103 -  CO2 22 - 32 mmol/L - 18(L) 26  Calcium 8.9 - 10.3 mg/dL - 9.9 9.6  Total Protein 6.5 - 8.1 g/dL - 7.5 -  Total Bilirubin 0.3 - 1.2 mg/dL - 1.2 -  Alkaline Phos 38 - 126 U/L - 51 -  AST 15 - 41 U/L - 67(H) -  ALT 14 - 54 U/L - 31 -    CBC Latest Ref Rng 11/16/2014 11/30/2014 09/02/2013  WBC 4.0 - 10.5 K/uL - 18.8(H) 5.8  Hemoglobin 12.0 - 15.0 g/dL 16.0(H) 15.3(H) 13.7  Hematocrit 36.0 - 46.0 % 47.0(H) 44.3 42.1  Platelets 150 - 400 K/uL - 190 173    Ct Head Wo  Contrast  11/30/2014   CLINICAL DATA:  Patient found on floor by family.  Intubated.  EXAM: CT HEAD WITHOUT CONTRAST  CT CERVICAL SPINE WITHOUT CONTRAST  TECHNIQUE: Multidetector CT imaging of the head and cervical spine was performed following the standard protocol without intravenous contrast. Multiplanar CT image reconstructions of the cervical spine were also generated.  COMPARISON:  None.  FINDINGS: CT HEAD FINDINGS  There is a 5.0 x 4.9 cm acute intraparenchymal hemorrhage centered about the left basal ganglia. There is intraventricular extension into lateral, third and fourth ventricles. There is dilation of the lateral ventricles most compatible with hydrocephalus. Additionally there is scattered subarachnoid hemorrhage within the left and right cerebral hemispheres. Periventricular and subcortical white matter hypodensity compatible with chronic small vessel ischemic changes. There is 7 mm of left-to-right midline shift. Orbits are unremarkable. Patient is intubated. Calvarium is intact. Paranasal sinus mucosal thickening and fluid.  CT CERVICAL SPINE FINDINGS  Normal anatomic alignment. No evidence for acute fracture or dislocation. Multilevel degenerative and facet disease. Degenerative disc disease most pronounced C5-6. The thyroid gland is enlarged and contains multiple calcifications.  IMPRESSION: Large acute intraparenchymal hemorrhage centered about the left basal ganglia with  intraventricular extension. Dilation of the ventricular system most compatible with associated hydrocephalus. This results in approximately 7 mm left-to-right midline shift.  No evidence for cervical spine fracture.  Critical Value/emergent results were called by telephone at the time of interpretation on 11/21/2014 at 8:48 pm to Dr. Davonna Belling , who verbally acknowledged these results.   Electronically Signed   By: Lovey Newcomer M.D.   On: 12/05/2014 20:57   Ct Cervical Spine Wo Contrast  11/25/2014   CLINICAL DATA:   Patient found on floor by family.  Intubated.  EXAM: CT HEAD WITHOUT CONTRAST  CT CERVICAL SPINE WITHOUT CONTRAST  TECHNIQUE: Multidetector CT imaging of the head and cervical spine was performed following the standard protocol without intravenous contrast. Multiplanar CT image reconstructions of the cervical spine were also generated.  COMPARISON:  None.  FINDINGS: CT HEAD FINDINGS  There is a 5.0 x 4.9 cm acute intraparenchymal hemorrhage centered about the left basal ganglia. There is intraventricular extension into lateral, third and fourth ventricles. There is dilation of the lateral ventricles most compatible with hydrocephalus. Additionally there is scattered subarachnoid hemorrhage within the left and right cerebral hemispheres. Periventricular and subcortical white matter hypodensity compatible with chronic small vessel ischemic changes. There is 7 mm of left-to-right midline shift. Orbits are unremarkable. Patient is intubated. Calvarium is intact. Paranasal sinus mucosal thickening and fluid.  CT CERVICAL SPINE FINDINGS  Normal anatomic alignment. No evidence for acute fracture or dislocation. Multilevel degenerative and facet disease. Degenerative disc disease most pronounced C5-6. The thyroid gland is enlarged and contains multiple calcifications.  IMPRESSION: Large acute intraparenchymal hemorrhage centered about the left basal ganglia with intraventricular extension. Dilation of the ventricular system most compatible with associated hydrocephalus. This results in approximately 7 mm left-to-right midline shift.  No evidence for cervical spine fracture.  Critical Value/emergent results were called by telephone at the time of interpretation on 11/09/2014 at 8:48 pm to Dr. Davonna Belling , who verbally acknowledged these results.   Electronically Signed   By: Lovey Newcomer M.D.   On: 11/25/2014 20:57   Dg Chest Portable 1 View  11/14/2014   CLINICAL DATA:  Endotracheal tube and orogastric tube placement.   EXAM: PORTABLE CHEST - 1 VIEW  COMPARISON:  01/15/2012.  FINDINGS: Endotracheal tube tip projects 3.3 cm above the carina.  Orogastric tube passes below the diaphragm into the mid stomach.  Cardiac silhouette is normal in size. No mediastinal or hilar masses or convincing adenopathy.  Clear lungs.  No pleural effusion or pneumothorax.  IMPRESSION: 1. Endotracheal tube and orogastric tube are well positioned as described. 2. No acute cardiopulmonary disease.   Electronically Signed   By: Lajean Manes M.D.   On: 11/14/2014 20:17   ASSESSMENT: Intracerebral hemorrhage  Obstructive hydrocephalus Comatose Compromised airway Acute respiratory failure Hyperkalemia Acute kidney injury Rhabdomyolysis Lactic acidosis Leukocytosis Hypotension 2nd to hypovolemia Hx of Atrial fibrillation on coumadin Hx of CAD Hx of HTN Hx of Diabetes mellitus  PLAN: DNR Continue low dose morphine gtt and titrate for comfort  No escalation of care  Transition to comfort measures when family arrives in PM of 8/06  Nickolas Madrid, NP 11/12/2014  8:07 AM Pager: [phone] or [phone]

## 2014-11-12 NOTE — Plan of Care (Signed)
Problem: Acute Treatment Outcomes Goal: Prognosis discussed with family/patient as appropriate Outcome: Completed/Met Date Met:  11/12/14 Plan to withdraw care when more family arrives.

## 2014-11-12 NOTE — Progress Notes (Signed)
Nutrition Brief Note  Chart reviewed. 81 y.o. female admitted on 11/15/2014 with altered consciousness after she was found at home on the floor. Intubated for airway protection s/p catastrophic ICH. She is transitioning to withdrawal of care when family arrives this afternoon per nursing.  No further nutrition interventions warranted at this time.  Please re-consult as needed.   Colman Cater MS,RD,CSG,LDN Office: [phone] Pager: [phone]

## 2014-11-12 NOTE — ED Provider Notes (Signed)
CSN: [id number]     Arrival date & time 12/07/2014  1936 History   First MD Initiated Contact with Patient 11/15/2014 1942     Chief Complaint  Patient presents with  . Code STEMI     level V caveat due to altered mental status. The history is provided by the patient and the EMS personnel.   patient came in after being found unresponsive. Last seen last night by family members. Called prehospital as a STEMI with some ST elevation. Initial reportedly would follow some commands her family members but would not in the ER. Some gurgling respirations and was not following commands. She is on anticoagulation for atrial fibrillation. Found on the floor and may have fallen and hit her head.  Past Medical History  Diagnosis Date  . Coronary artery disease     STATUS POST OLD INFERIOR WALL M.I.  . Chronic anticoagulation   . Atrial fibrillation   . Hypertension   . Hyperlipidemia   . MI, old 2008  . Shortness of breath   . Diabetes mellitus     TYPE 2  . Arthritis    Past Surgical History  Procedure Laterality Date  . Appendectomy    . Total hip arthroplasty      RIGHT HIP  . Abdominal hysterectomy  1976    1976  . Cataract extraction      Bilateral  . Breast biopsy  2013  . Coronary angioplasty with stent placement    . Amputation  01/16/2012    Procedure: AMPUTATION BELOW KNEE;  Surgeon: Garald Balding, MD;  Location: [facility];  Service: Orthopedics;  Laterality: Left;  Left Below Knee Amputation   Family History  Problem Relation Age of Onset  . Cancer Father 25  . Cancer Mother   . Cancer Daughter   . Diabetes Daughter   . Hypertension Daughter   . Other Daughter     varicose veins   History  Substance Use Topics  . Smoking status: Never Smoker   . Smokeless tobacco: Never Used  . Alcohol Use: No   OB History    No data available     Review of Systems  Unable to perform ROS     Allergies  Bactrim; Janumet; Adhesive; Ciprofloxacin; Percocet; Sulfa antibiotics;  and Penicillins  Home Medications   Prior to Admission medications   Medication Sig Start Date End Date Taking? Authorizing Provider  acetaminophen (TYLENOL) 500 MG tablet Take 1,000 mg by mouth at bedtime.   Yes Historical Provider, MD  aspirin 81 MG tablet Take 81 mg by mouth every morning.    Yes Historical Provider, MD  brimonidine-timolol (COMBIGAN) 0.2-0.5 % ophthalmic solution Place 1 drop into both eyes 2 (two) times daily.   Yes Historical Provider, MD  Cholecalciferol (VITAMIN D3) 400 UNITS CAPS Take 1 tablet by mouth daily.    Yes Historical Provider, MD  magnesium citrate SOLN Take 0.5 Bottles by mouth as needed for severe constipation.   Yes Historical Provider, MD  metFORMIN (GLUCOPHAGE) 500 MG tablet Take 500 mg by mouth every evening.    Yes Historical Provider, MD  Omega-3 Fatty Acids (FISH OIL) 1000 MG CAPS Take 1 capsule by mouth every morning.    Yes Historical Provider, MD  Red Yeast Rice Extract (RED YEAST RICE PO) Take 1 tablet by mouth at bedtime.   Yes Historical Provider, MD  WARFARIN SODIUM PO Take 2 tablets by mouth daily at 12 noon.   Yes Historical Provider, MD  BP 97/59 mmHg  Pulse 80  Temp(Src) 94.6 F (34.8 C) (Core (Comment))  Resp 15  Ht 5\' 7"  (1.702 m)  Wt 185 lb 3 oz (84 kg)  BMI 29.00 kg/m2  SpO2 100% Physical Exam  Constitutional: She appears well-developed.  HENT:  Head: Normocephalic.  Eyes:  Pupils minimally reactive, right mildly increased compared to left.  Neck:  Cervical collar in place  Cardiovascular:  Irregular rhythm  Pulmonary/Chest:  Rales at bilateral bases and transmitted upper airway sounds.  Abdominal: She exhibits no distension.  Musculoskeletal: She exhibits no tenderness.  Neurological:  Minimally responsive. Breathing spontaneously. Will withdraw to pain on right side and less so on left side. Will withdraw on right lower extremity. Has had left-sided below the knee amputation.  Skin: Skin is warm.  Mass to left  upper chest wall with dressing over it.    ED Course  Procedures (including critical care time) Labs Review Labs Reviewed  COMPREHENSIVE METABOLIC PANEL - Abnormal; Notable for the following:    Potassium 5.6 (*)    CO2 18 (*)    Glucose, Bld 341 (*)    BUN 30 (*)    Creatinine, Ser 1.31 (*)    AST 67 (*)    GFR calc non Af Amer 37 (*)    GFR calc Af Amer 43 (*)    Anion gap 18 (*)    All other components within normal limits  PROTIME-INR - Abnormal; Notable for the following:    Prothrombin Time 31.8 (*)    INR 3.15 (*)    All other components within normal limits  CBC WITH DIFFERENTIAL/PLATELET - Abnormal; Notable for the following:    WBC 18.8 (*)    Hemoglobin 15.3 (*)    Neutrophils Relative % 81 (*)    Neutro Abs 15.3 (*)    Lymphocytes Relative 9 (*)    Monocytes Absolute 1.9 (*)    All other components within normal limits  URINALYSIS, ROUTINE W REFLEX MICROSCOPIC (NOT AT [facility]) - Abnormal; Notable for the following:    Color, Urine AMBER (*)    APPearance CLOUDY (*)    Glucose, UA >1000 (*)    Hgb urine dipstick MODERATE (*)    Bilirubin Urine SMALL (*)    Ketones, ur 15 (*)    All other components within normal limits  CK - Abnormal; Notable for the following:    Total CK 1959 (*)    All other components within normal limits  URINE MICROSCOPIC-ADD ON - Abnormal; Notable for the following:    Squamous Epithelial / LPF FEW (*)    Casts GRANULAR CAST (*)    All other components within normal limits  I-STAT CG4 LACTIC ACID, ED - Abnormal; Notable for the following:    Lactic Acid, Venous 5.13 (*)    All other components within normal limits  I-STAT TROPOININ, ED - Abnormal; Notable for the following:    Troponin i, poc 1.58 (*)    All other components within normal limits  I-STAT CHEM 8, ED - Abnormal; Notable for the following:    Potassium 5.5 (*)    BUN 44 (*)    Creatinine, Ser 1.10 (*)    Glucose, Bld 334 (*)    Hemoglobin 16.0 (*)    HCT 47.0 (*)     All other components within normal limits  MRSA PCR SCREENING  CULTURE, BLOOD (ROUTINE X 2)  CULTURE, BLOOD (ROUTINE X 2)  ETHANOL    Imaging Review Ct  Head Wo Contrast  11/18/2014   CLINICAL DATA:  Patient found on floor by family.  Intubated.  EXAM: CT HEAD WITHOUT CONTRAST  CT CERVICAL SPINE WITHOUT CONTRAST  TECHNIQUE: Multidetector CT imaging of the head and cervical spine was performed following the standard protocol without intravenous contrast. Multiplanar CT image reconstructions of the cervical spine were also generated.  COMPARISON:  None.  FINDINGS: CT HEAD FINDINGS  There is a 5.0 x 4.9 cm acute intraparenchymal hemorrhage centered about the left basal ganglia. There is intraventricular extension into lateral, third and fourth ventricles. There is dilation of the lateral ventricles most compatible with hydrocephalus. Additionally there is scattered subarachnoid hemorrhage within the left and right cerebral hemispheres. Periventricular and subcortical white matter hypodensity compatible with chronic small vessel ischemic changes. There is 7 mm of left-to-right midline shift. Orbits are unremarkable. Patient is intubated. Calvarium is intact. Paranasal sinus mucosal thickening and fluid.  CT CERVICAL SPINE FINDINGS  Normal anatomic alignment. No evidence for acute fracture or dislocation. Multilevel degenerative and facet disease. Degenerative disc disease most pronounced C5-6. The thyroid gland is enlarged and contains multiple calcifications.  IMPRESSION: Large acute intraparenchymal hemorrhage centered about the left basal ganglia with intraventricular extension. Dilation of the ventricular system most compatible with associated hydrocephalus. This results in approximately 7 mm left-to-right midline shift.  No evidence for cervical spine fracture.  Critical Value/emergent results were called by telephone at the time of interpretation on 11/23/2014 at 8:48 pm to Dr. Davonna Belling , who  verbally acknowledged these results.   Electronically Signed   By: Lovey Newcomer M.D.   On: 12/05/2014 20:57   Ct Cervical Spine Wo Contrast  11/09/2014   CLINICAL DATA:  Patient found on floor by family.  Intubated.  EXAM: CT HEAD WITHOUT CONTRAST  CT CERVICAL SPINE WITHOUT CONTRAST  TECHNIQUE: Multidetector CT imaging of the head and cervical spine was performed following the standard protocol without intravenous contrast. Multiplanar CT image reconstructions of the cervical spine were also generated.  COMPARISON:  None.  FINDINGS: CT HEAD FINDINGS  There is a 5.0 x 4.9 cm acute intraparenchymal hemorrhage centered about the left basal ganglia. There is intraventricular extension into lateral, third and fourth ventricles. There is dilation of the lateral ventricles most compatible with hydrocephalus. Additionally there is scattered subarachnoid hemorrhage within the left and right cerebral hemispheres. Periventricular and subcortical white matter hypodensity compatible with chronic small vessel ischemic changes. There is 7 mm of left-to-right midline shift. Orbits are unremarkable. Patient is intubated. Calvarium is intact. Paranasal sinus mucosal thickening and fluid.  CT CERVICAL SPINE FINDINGS  Normal anatomic alignment. No evidence for acute fracture or dislocation. Multilevel degenerative and facet disease. Degenerative disc disease most pronounced C5-6. The thyroid gland is enlarged and contains multiple calcifications.  IMPRESSION: Large acute intraparenchymal hemorrhage centered about the left basal ganglia with intraventricular extension. Dilation of the ventricular system most compatible with associated hydrocephalus. This results in approximately 7 mm left-to-right midline shift.  No evidence for cervical spine fracture.  Critical Value/emergent results were called by telephone at the time of interpretation on 12/06/2014 at 8:48 pm to Dr. Davonna Belling , who verbally acknowledged these results.    Electronically Signed   By: Lovey Newcomer M.D.   On: 12/01/2014 20:57   Dg Chest Portable 1 View  11/21/2014   CLINICAL DATA:  Endotracheal tube and orogastric tube placement.  EXAM: PORTABLE CHEST - 1 VIEW  COMPARISON:  01/15/2012.  FINDINGS: Endotracheal tube tip projects 3.3  cm above the carina.  Orogastric tube passes below the diaphragm into the mid stomach.  Cardiac silhouette is normal in size. No mediastinal or hilar masses or convincing adenopathy.  Clear lungs.  No pleural effusion or pneumothorax.  IMPRESSION: 1. Endotracheal tube and orogastric tube are well positioned as described. 2. No acute cardiopulmonary disease.   Electronically Signed   By: Lajean Manes M.D.   On: 11/29/2014 20:17     EKG Interpretation   Date/Time:  Friday November 11 2014 19:44:58 EDT Ventricular Rate:  75 PR Interval:    QRS Duration: 96 QT Interval:  432 QTC Calculation: 482 R Axis:   73 Text Interpretation:  Atrial fibrillation Low voltage, extremity and  precordial leads Anteroseptal infarct, old ST depr, consider ischemia,  inferior leads ST elevation in I Confirmed by Clayten Allcock  MD, Ovid Curd  [phone]) on 11/26/2014 8:31:29 PM      MDM   Final diagnoses:  Nontraumatic intracerebral hemorrhage, unspecified cerebral location    Patient presented unresponsive. Intubated for airway protection. On Coumadin and worry initially for intracranial hemorrhage. CT scan did show a large intracranial hemorrhage. Discussed with Dr. Sherwood Gambler, who states this will be a fatal bleed. Discussed with family members and patient was changed to DO NOT RESUSCITATE. Patient will be admitted to the ICU. Had had some hypotension and hypothermia, however with patient's terminal prognosis will not give N biotics or treat for infection.  CRITICAL CARE Performed by: Mackie Pai Total critical care time: 30 Critical care time was exclusive of separately billable procedures and treating other patients. Critical care was  necessary to treat or prevent imminent or life-threatening deterioration. Critical care was time spent personally by me on the following activities: development of treatment plan with patient and/or surrogate as well as nursing, discussions with consultants, evaluation of patient's response to treatment, examination of patient, obtaining history from patient or surrogate, ordering and performing treatments and interventions, ordering and review of laboratory studies, ordering and review of radiographic studies, pulse oximetry and re-evaluation of patient's condition.   leg  Davonna Belling, MD 11/12/14 [phone]

## 2014-11-12 NOTE — Progress Notes (Signed)
Chaplain paged that family had arrived for pt in Trauma B. Family was already at bedside in Trauma B. Pt's pastor was arriving just as I arrived, so I did not intrude on his ministry with family. When pt was moved to 3M05 I led family from ED to [place] waiting area and informed Production manager of their presence. Visited with pt's daughter and son and provided emotional and spiritual support, especially for pt's son.

## 2014-11-12 NOTE — Progress Notes (Signed)
Family stated that the pt has a recent new diagnosis of lymphoma and seen at [facility] on the [facility].

## 2014-11-12 NOTE — Procedures (Signed)
Extubation Procedure Note  Patient Details:   Name: Kathryn Schroeder DOB: 1930-05-09 MRN: [id number]   Airway Documentation:     Evaluation  O2 sats:  Complications:  Patient did tolerate procedure well. Bilateral Breath Sounds: Clear Suctioning: Airway Yes.   Extubated per dr. Kayleen Memos.  Terminal extubation  Kathryn Schroeder 11/12/2014, 6:16 PM

## 2014-11-12 NOTE — Progress Notes (Signed)
Utilization Review completed. Tashona Calk RN BSN CM 

## 2014-11-12 NOTE — Progress Notes (Signed)
eLink Physician-Brief Progress Note Patient Name: Kathryn Schroeder DOB: 1931/03/18 MRN: [id number]   Date of Service  11/12/2014  HPI/Events of Note  Family at bedside, requested extubation. Offered chaplain services  eICU Interventions  Orders for terminal extubation placed.      Intervention Category Intermediate Interventions: Respiratory distress - evaluation and management  Khanh Tanori 11/12/2014, 5:57 PM

## 2014-11-12 NOTE — Progress Notes (Signed)
C-Collar removed per Dr. Juanetta Gosling approval.

## 2014-11-12 NOTE — H&P (Signed)
PULMONARY / CRITICAL CARE MEDICINE   Name: Kathryn Schroeder MRN: [id number] DOB: 1930-11-13    ADMISSION DATE:  12/06/2014  CHIEF COMPLAINT:  ICH  HISTORY OF PRESENT ILLNESS:   39F with h/o glaucoma, afib on warfarin, she was in her usual state of health until today when she was found down today by family members.  Unknown duration of time but the family members say "all day."    In the ED she was found to have an catastrophic ICH on CT, intubated for GCS 3.  PAST MEDICAL HISTORY :   has a past medical history of Coronary artery disease; Chronic anticoagulation; Atrial fibrillation; Hypertension; Hyperlipidemia; MI, old (2008); Shortness of breath; Diabetes mellitus; and Arthritis.  has past surgical history that includes Appendectomy; Total hip arthroplasty; Abdominal hysterectomy (1976); Cataract extraction; Breast biopsy (2013); Coronary angioplasty with stent; and Amputation (01/16/2012). Prior to Admission medications   Medication Sig Start Date End Date Taking? Authorizing Provider  acetaminophen (TYLENOL) 500 MG tablet Take 1,000 mg by mouth at bedtime.   Yes Historical Provider, MD  aspirin 81 MG tablet Take 81 mg by mouth every morning.    Yes Historical Provider, MD  brimonidine-timolol (COMBIGAN) 0.2-0.5 % ophthalmic solution Place 1 drop into both eyes 2 (two) times daily.   Yes Historical Provider, MD  Cholecalciferol (VITAMIN D3) 400 UNITS CAPS Take 1 tablet by mouth daily.    Yes Historical Provider, MD  magnesium citrate SOLN Take 0.5 Bottles by mouth as needed for severe constipation.   Yes Historical Provider, MD  metFORMIN (GLUCOPHAGE) 500 MG tablet Take 500 mg by mouth every evening.    Yes Historical Provider, MD  Omega-3 Fatty Acids (FISH OIL) 1000 MG CAPS Take 1 capsule by mouth every morning.    Yes Historical Provider, MD  Red Yeast Rice Extract (RED YEAST RICE PO) Take 1 tablet by mouth at bedtime.   Yes Historical Provider, MD  WARFARIN SODIUM PO Take 2 tablets  by mouth daily at 12 noon.   Yes Historical Provider, MD   Allergies  Allergen Reactions  . Bactrim [Sulfamethoxazole-Trimethoprim] Hives  . Janumet [Sitagliptin-Metformin Hcl] Nausea Only, Swelling and Other (See Comments)    weakness  . Adhesive [Tape]     WHITE tape: Causes blisters and skin reaction  . Ciprofloxacin Other (See Comments)    unknown  . Percocet [Oxycodone-Acetaminophen] Other (See Comments)    Makes her crazy  . Sulfa Antibiotics Hives    Hyperkalemia with bactrim  . Penicillins Itching and Rash    Childhood allergy:     FAMILY HISTORY:  indicated that her mother is deceased. She indicated that her father is deceased.  SOCIAL HISTORY:  reports that she has never smoked. She has never used smokeless tobacco. She reports that she does not drink alcohol or use illicit drugs.  REVIEW OF SYSTEMS:  Unable to obtain  SUBJECTIVE:   VITAL SIGNS: Temp:  [92.8 F (33.8 C)-93.9 F (34.4 C)] 93.9 F (34.4 C) (08/05 2300) Pulse Rate:  [27-96] 60 (08/05 2300) Resp:  [12-22] 17 (08/05 2300) BP: (75-147)/(52-97) 131/86 mmHg (08/05 2300) SpO2:  [89 %-100 %] 100 % (08/05 2300) FiO2 (%):  [100 %] 100 % (08/05 2024) Weight:  [83.915 kg (185 lb)-84 kg (185 lb 3 oz)] 84 kg (185 lb 3 oz) (08/05 2249) HEMODYNAMICS:   VENTILATOR SETTINGS: Vent Mode:  [-] PRVC FiO2 (%):  [100 %] 100 % Set Rate:  [16 bmp] 16 bmp Vt Set:  [500 mL]  500 mL PEEP:  [5 [place] Pressure:  [19 cmH20] 19 cmH20 INTAKE / OUTPUT: No intake or output data in the 24 hours ending 11/12/14 0009  PHYSICAL EXAMINATION: General:  Intubated, not sedated Neuro:  Localizes to pain b/l UE/LE, + gag and corneal, does not breath on CPAP/PS ventilation.  Does not open eyes.  +anisocoria, left pupil is 80mm non-reactive, right pupil 6mm non-reactive.  GCS = 5T HEENT:  ETT in place Cardiovascular:  Bradycardic, iregular, no m/r/g Lungs:  CTA b/l no w/r/r/ Abdomen:  Soft, no bowel  sounds Musculoskeletal:  Normal bulk and tone Skin:  Multiple ecchymosis.  Left sholder, large fungating mass  LABS:  CBC  Recent Labs Lab 11/08/2014 1952 11/30/2014 1956  WBC 18.8*  --   HGB 15.3* 16.0*  HCT 44.3 47.0*  PLT 190  --    Coag's  Recent Labs Lab 11/22/2014 1952  INR 3.15*   BMET  Recent Labs Lab 11/26/2014 1952 12/05/2014 1956  NA 139 139  K 5.6* 5.5*  CL 103 104  CO2 18*  --   BUN 30* 44*  CREATININE 1.31* 1.10*  GLUCOSE 341* 334*   Electrolytes  Recent Labs Lab 11/16/2014 1952  CALCIUM 9.9   Sepsis Markers  Recent Labs Lab 12/03/2014 1957  LATICACIDVEN 5.13*   ABG No results for input(s): PHART, PCO2ART, PO2ART in the last 168 hours. Liver Enzymes  Recent Labs Lab 11/25/2014 1952  AST 67*  ALT 31  ALKPHOS 51  BILITOT 1.2  ALBUMIN 3.8   Cardiac Enzymes No results for input(s): TROPONINI, PROBNP in the last 168 hours. Glucose No results for input(s): GLUCAP in the last 168 hours.  Imaging Ct Head Wo Contrast  11/12/2014   CLINICAL DATA:  Patient found on floor by family.  Intubated.  EXAM: CT HEAD WITHOUT CONTRAST  CT CERVICAL SPINE WITHOUT CONTRAST  TECHNIQUE: Multidetector CT imaging of the head and cervical spine was performed following the standard protocol without intravenous contrast. Multiplanar CT image reconstructions of the cervical spine were also generated.  COMPARISON:  None.  FINDINGS: CT HEAD FINDINGS  There is a 5.0 x 4.9 cm acute intraparenchymal hemorrhage centered about the left basal ganglia. There is intraventricular extension into lateral, third and fourth ventricles. There is dilation of the lateral ventricles most compatible with hydrocephalus. Additionally there is scattered subarachnoid hemorrhage within the left and right cerebral hemispheres. Periventricular and subcortical white matter hypodensity compatible with chronic small vessel ischemic changes. There is 7 mm of left-to-right midline shift. Orbits are  unremarkable. Patient is intubated. Calvarium is intact. Paranasal sinus mucosal thickening and fluid.  CT CERVICAL SPINE FINDINGS  Normal anatomic alignment. No evidence for acute fracture or dislocation. Multilevel degenerative and facet disease. Degenerative disc disease most pronounced C5-6. The thyroid gland is enlarged and contains multiple calcifications.  IMPRESSION: Large acute intraparenchymal hemorrhage centered about the left basal ganglia with intraventricular extension. Dilation of the ventricular system most compatible with associated hydrocephalus. This results in approximately 7 mm left-to-right midline shift.  No evidence for cervical spine fracture.  Critical Value/emergent results were called by telephone at the time of interpretation on 11/15/2014 at 8:48 pm to Dr. Davonna Belling , who verbally acknowledged these results.   Electronically Signed   By: Lovey Newcomer M.D.   On: 11/15/2014 20:57   Ct Cervical Spine Wo Contrast  11/20/2014   CLINICAL DATA:  Patient found on floor by family.  Intubated.  EXAM: CT HEAD WITHOUT CONTRAST  CT CERVICAL SPINE WITHOUT CONTRAST  TECHNIQUE: Multidetector CT imaging of the head and cervical spine was performed following the standard protocol without intravenous contrast. Multiplanar CT image reconstructions of the cervical spine were also generated.  COMPARISON:  None.  FINDINGS: CT HEAD FINDINGS  There is a 5.0 x 4.9 cm acute intraparenchymal hemorrhage centered about the left basal ganglia. There is intraventricular extension into lateral, third and fourth ventricles. There is dilation of the lateral ventricles most compatible with hydrocephalus. Additionally there is scattered subarachnoid hemorrhage within the left and right cerebral hemispheres. Periventricular and subcortical white matter hypodensity compatible with chronic small vessel ischemic changes. There is 7 mm of left-to-right midline shift. Orbits are unremarkable. Patient is intubated.  Calvarium is intact. Paranasal sinus mucosal thickening and fluid.  CT CERVICAL SPINE FINDINGS  Normal anatomic alignment. No evidence for acute fracture or dislocation. Multilevel degenerative and facet disease. Degenerative disc disease most pronounced C5-6. The thyroid gland is enlarged and contains multiple calcifications.  IMPRESSION: Large acute intraparenchymal hemorrhage centered about the left basal ganglia with intraventricular extension. Dilation of the ventricular system most compatible with associated hydrocephalus. This results in approximately 7 mm left-to-right midline shift.  No evidence for cervical spine fracture.  Critical Value/emergent results were called by telephone at the time of interpretation on 11/16/2014 at 8:48 pm to Dr. Davonna Belling , who verbally acknowledged these results.   Electronically Signed   By: Lovey Newcomer M.D.   On: 11/17/2014 20:57   Dg Chest Portable 1 View  11/08/2014   CLINICAL DATA:  Endotracheal tube and orogastric tube placement.  EXAM: PORTABLE CHEST - 1 VIEW  COMPARISON:  01/15/2012.  FINDINGS: Endotracheal tube tip projects 3.3 cm above the carina.  Orogastric tube passes below the diaphragm into the mid stomach.  Cardiac silhouette is normal in size. No mediastinal or hilar masses or convincing adenopathy.  Clear lungs.  No pleural effusion or pneumothorax.  IMPRESSION: 1. Endotracheal tube and orogastric tube are well positioned as described. 2. No acute cardiopulmonary disease.   Electronically Signed   By: Lajean Manes M.D.   On: 11/28/2014 20:17     ASSESSMENT / PLAN: 50F suffering catastrophic ICH likely from HTN vs cerebral amyloid angiopathy.  She has e/o blood in the ventricles b/l and in the basilar cistern, as well as effacement of sulci c/w cerebral edema and e/o transtentoral herniation on CT and clinical exam.  Neurosurgery evaluated scans and stated this is picture has an extremely poor prognosis and she is not a surgical  candidate.  After discussion with the family, they elected to pursue comfort measures only.  They have family coming from out of town and would like her to remain intubated until then.  They understand if she becomes unstable or if he suffers a cardiac arrest, no escalation of therapy.  Plan: - comfort care order set activated - propofol and morphine gtt for tonight - DNR - plan for compassionate extubation tomorrow after last family member arrives (3pm roughly)  FAMILY  - Updates:  As above.   Total critical care time: 60 min  Critical care time was exclusive of separately billable procedures and treating other patients.  Critical care was necessary to treat or prevent imminent or life-threatening deterioration.  Critical care was time spent personally by me on the following activities: development of treatment plan with patient and/or surrogate as well as nursing, discussions with consultants, evaluation of patient's response to treatment, examination of patient, obtaining history from patient  or surrogate, ordering and performing treatments and interventions, ordering and review of laboratory studies, ordering and review of radiographic studies, pulse oximetry and re-evaluation of patient's condition.   Meribeth Mattes, DO., MS [facility]     Pulmonary and [place] Pager: [phone]  11/12/2014, 12:09 AM

## 2014-11-13 MED ORDER — SCOPOLAMINE 1 MG/3DAYS TD PT72
1.0000 | MEDICATED_PATCH | TRANSDERMAL | Status: DC
  Administered 2014-11-13: 1.5 mg via TRANSDERMAL
  Filled 2014-11-13: qty 1

## 2014-11-13 MED ORDER — SODIUM CHLORIDE 0.9 % IV SOLN
1.0000 mg/h | INTRAVENOUS | Status: DC
  Filled 2014-11-13: qty 10

## 2014-11-15 NOTE — Discharge Summary (Signed)
   Kathryn Schroeder was a 81 y.o. female admitted on 12/05/2014 with altered consciousness after she was found at home on the floor.  She was intubated for airway protection.  She has hx of a fib and has been on coumadin.  CT head showed massive ICH.  She was made DNR.  She was extubated and transitioned to comfort measures.  She expired on 11-14-2014.  Final Diagnoses: Intracerebral hemorrhage  Obstructive hydrocephalus Comatose Compromised airway Acute respiratory failure Hyperkalemia Acute kidney injury Rhabdomyolysis Lactic acidosis Leukocytosis Hypotension 2nd to hypovolemia Hx of Atrial fibrillation on coumadin Hx of CAD Hx of HTN Hx of Diabetes mellitus  Chesley Mires, MD [facility] Pulmonary/Critical Care 11/15/2014, 12:52 PM

## 2014-11-15 NOTE — Progress Notes (Deleted)
   Kathryn Schroeder was a 81 y.o. female admitted on 11/14/2014 with altered consciousness after she was found at home on the floor.  She was intubated for airway protection.  She has hx of a fib and has been on coumadin.  CT head showed massive ICH.  She was made DNR.  She was extubated and transitioned to comfort measures.  She expired on 2014-11-17.  Final Diagnoses: Intracerebral hemorrhage  Obstructive hydrocephalus Comatose Compromised airway Acute respiratory failure Hyperkalemia Acute kidney injury Rhabdomyolysis Lactic acidosis Leukocytosis Hypotension 2nd to hypovolemia Hx of Atrial fibrillation on coumadin Hx of CAD Hx of HTN Hx of Diabetes mellitus  Chesley Mires, MD [facility] Pulmonary/Critical Care 11/15/2014, 12:43 PM

## 2014-11-16 ENCOUNTER — Telehealth: Payer: Self-pay

## 2014-11-16 LAB — CULTURE, BLOOD (ROUTINE X 2): CULTURE: NO GROWTH

## 2014-11-16 NOTE — Telephone Encounter (Signed)
On 11/16/2014 I received a death certificate from [facility]. The death certificate is for burial. The patient is a patient of [facility]. The death certificate will be taken to the pulmonary unit this pm for signature. On 12-19-2014 I received the death certificate back from [facility]. I got the death certificate ready and called the funeral home to let them know I was mailing the death certificate to the [facility] Dept per their request.

## 2014-11-17 LAB — CULTURE, BLOOD (ROUTINE X 2): Culture: NO GROWTH

## 2014-12-08 NOTE — Progress Notes (Signed)
225 ml of morphine gtt wasted in sink. Witnessed by Denyse Amass, RN

## 2014-12-08 NOTE — Progress Notes (Signed)
Patient asystole at [phone]. Daughter, Marcie Bal, and granddaughter at bedside notified. No heart sounds auscultated. MD notified.  All patient belongings previously sent home with Jana Half and Legrand Como (patient's daughter and son).

## 2014-12-08 NOTE — Progress Notes (Signed)
eLink Physician-Brief Progress Note Patient Name: Kathryn Schroeder DOB: 03-May-1930 MRN: [id number]   Date of Service  Nov 26, 2014  HPI/Events of Note  Patient terminally extubated earlier. Nurse requests titration orders for Morphine IV infusion.   eICU Interventions  Will order: 1. Comfort Measures. 2. Will order Morphine IV infusion titrated for patient comfort.      Intervention Category Minor Interventions: Routine modifications to care plan (e.g. PRN medications for pain, fever)  Sommer,Steven Eugene 11/26/14, 3:08 AM

## 2014-12-08 DEATH — deceased

## 2016-02-18 IMAGING — CT CT CERVICAL SPINE W/O CM
4 of 5 series · 15 of 35 positions shown, 17 images · non-contrast
Comparison: None.

CLINICAL DATA: Patient found on floor by family.  Intubated.

EXAM:
CT HEAD WITHOUT CONTRAST
CT CERVICAL SPINE WITHOUT CONTRAST
TECHNIQUE: Multidetector CT imaging of the head and cervical spine was
performed following the standard protocol without intravenous
contrast. Multiplanar CT image reconstructions of the cervical spine
were also generated.

[Series 6: c_spine 2.0 i40s 3 · axial · 0.29mm/px · z∈[-258,-138]mm · 4 of 102 slices shown, 5 images]
[im 21/102  soft-tissue]
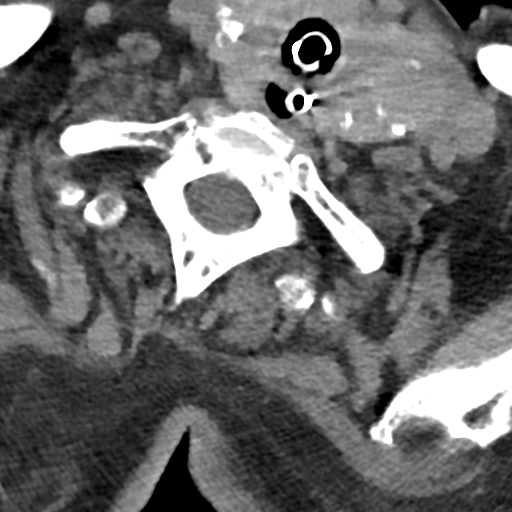
[im 21/102  bone]
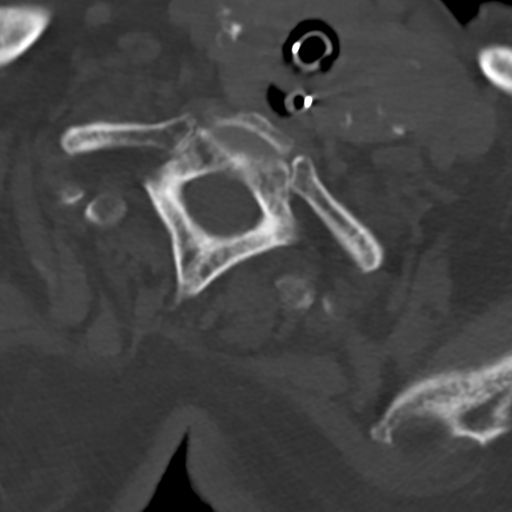
[im 41/102  bone]
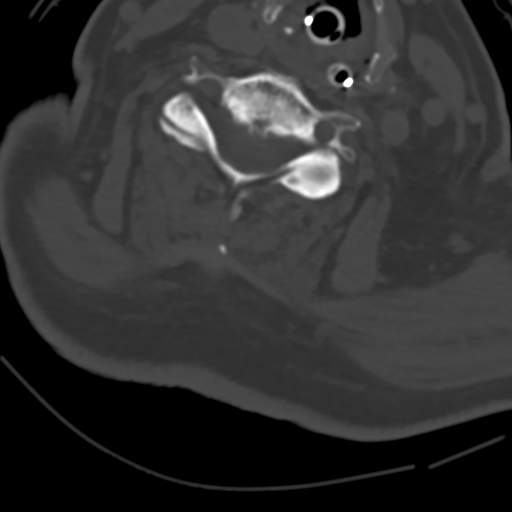
[im 61/102  bone]
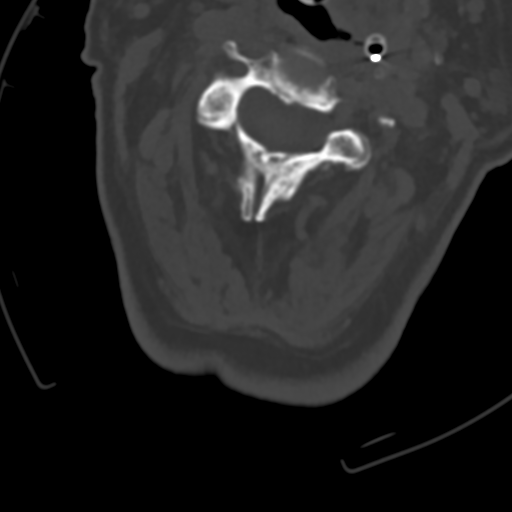
[im 81/102  bone]
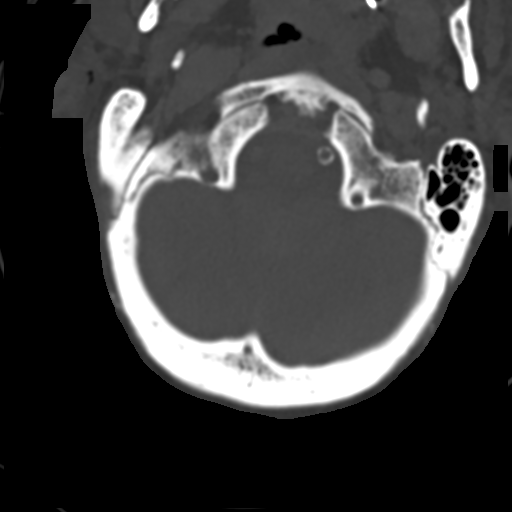

[Series 8: coronals · coronal · 0.33mm/px · 3 of 54 slices shown]
[im 11/54  bone]
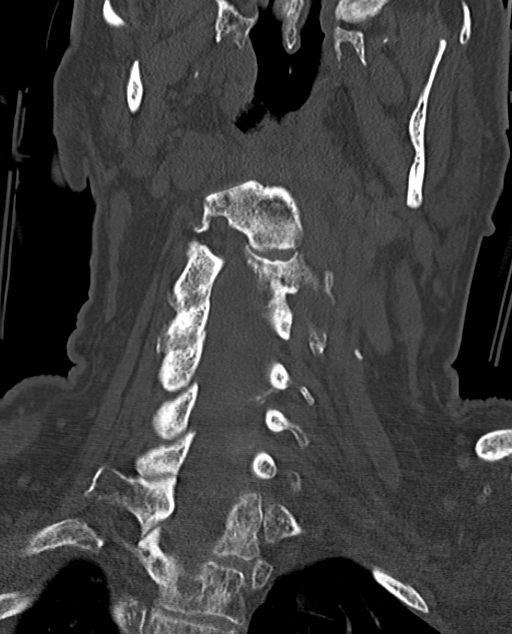
[im 22/54  bone]
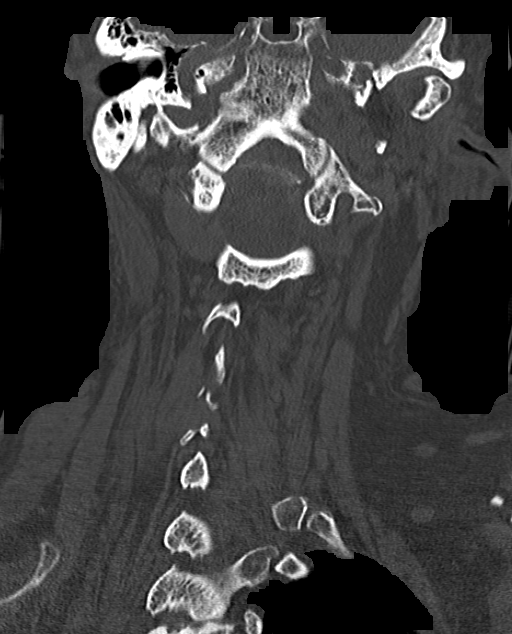
[im 32/54  bone]
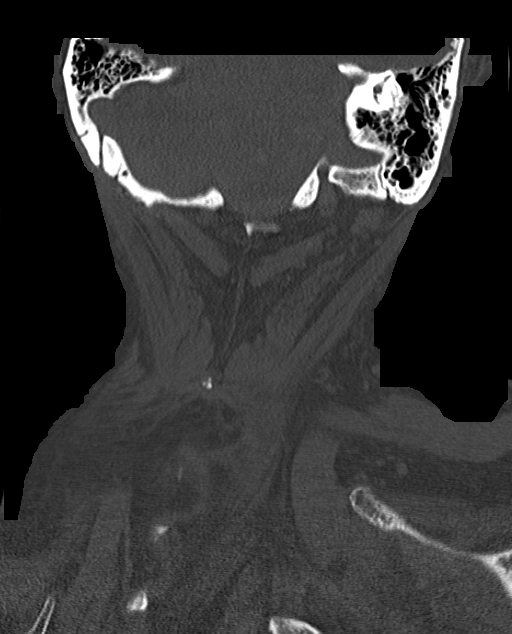

[Series 9: sagittals · sagittal · 0.39mm/px · 5 of 58 slices shown, 6 images]
[im 20/58  bone]
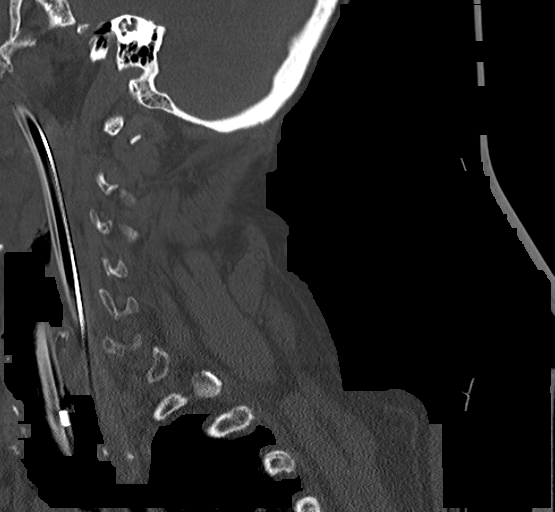
[im 24/58  bone]
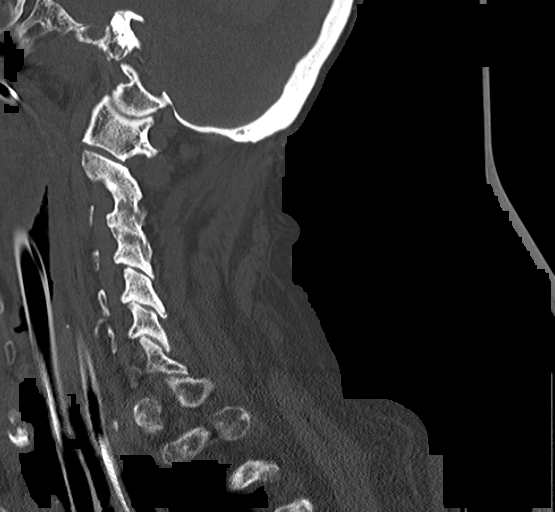
[im 29/58  soft-tissue]
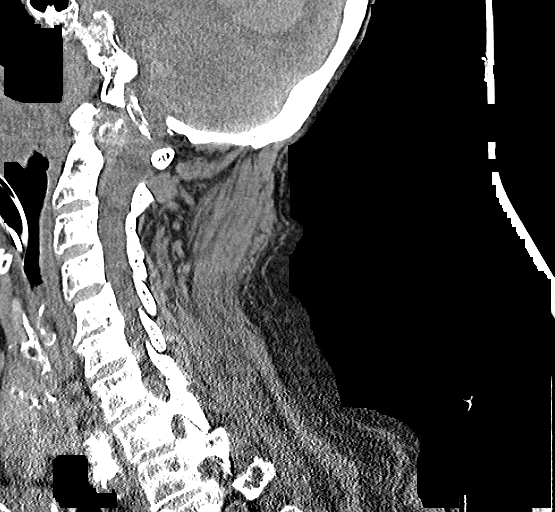
[im 29/58  bone]
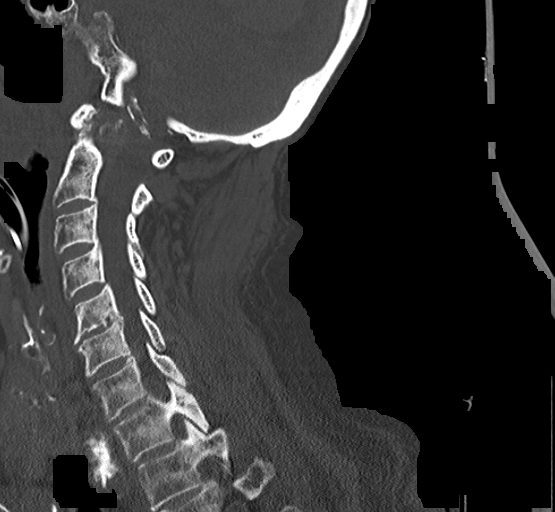
[im 34/58  bone]
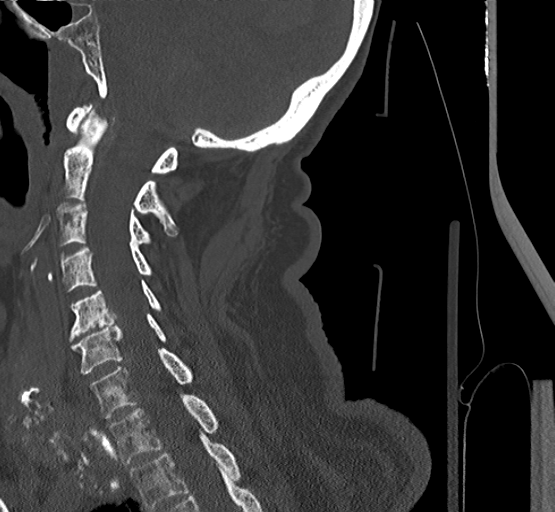
[im 39/58  bone]
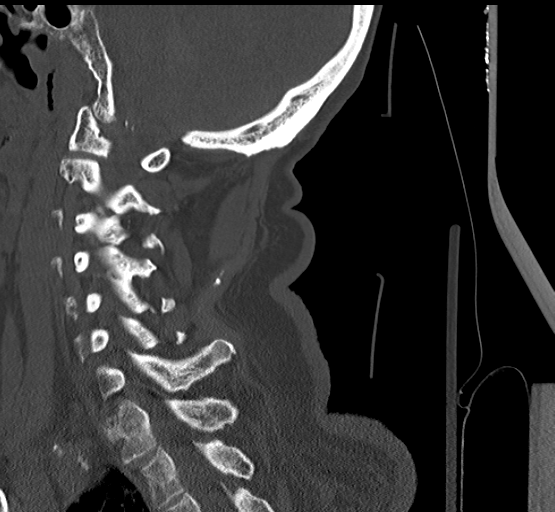

[Series 10: orthogonals · axial · 0.25mm/px · z∈[-285,-211]mm · 3 of 100 slices shown]
[im 20/100  bone]
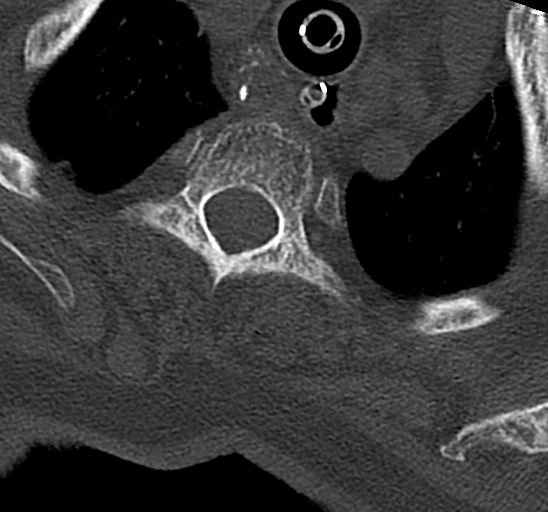
[im 40/100  bone]
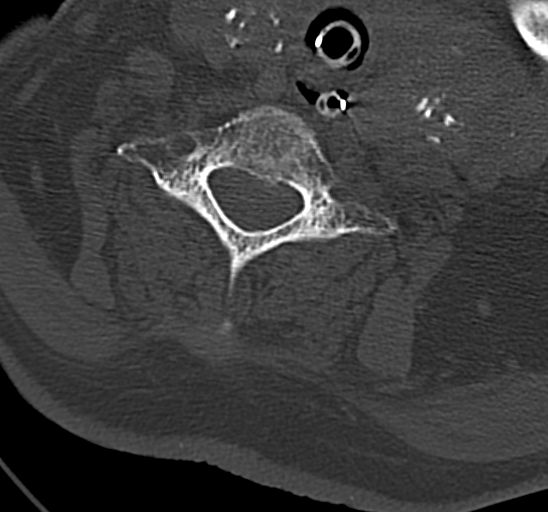
[im 60/100  bone]
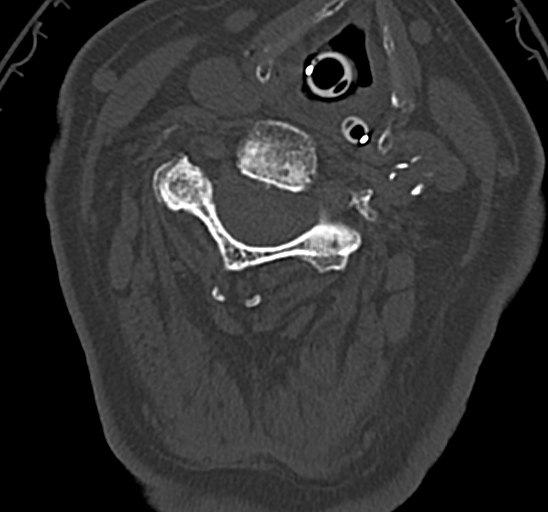

[15 of 35 positions shown; findings below may reference images not displayed]

FINDINGS: CT HEAD FINDINGS

There is a 5.0 x 4.9 cm acute intraparenchymal hemorrhage centered
about the left basal ganglia. There is intraventricular extension
into lateral, third and fourth ventricles. There is dilation of the
lateral ventricles most compatible with hydrocephalus. Additionally
there is scattered subarachnoid hemorrhage within the left and right
cerebral hemispheres. Periventricular and subcortical white matter
hypodensity compatible with chronic small vessel ischemic changes.
There is 7 mm of left-to-right midline shift. Orbits are
unremarkable. Patient is intubated. Calvarium is intact. Paranasal
sinus mucosal thickening and fluid.

CT CERVICAL SPINE FINDINGS

Normal anatomic alignment. No evidence for acute fracture or
dislocation. Multilevel degenerative and facet disease. Degenerative
disc disease most pronounced C5-6. The thyroid gland is enlarged and
contains multiple calcifications.
IMPRESSION: Large acute intraparenchymal hemorrhage centered about the left
basal ganglia with intraventricular extension. Dilation of the
ventricular system most compatible with associated hydrocephalus.
This results in approximately 7 mm left-to-right midline shift.

No evidence for cervical spine fracture.

Critical Value/emergent results were called by telephone at the time
of interpretation on 11/11/2014 at [DATE] to Dr. PERALTA BURNER ,
who verbally acknowledged these results.
